# Patient Record
Sex: Female | Born: 1965
Health system: Southern US, Community
[De-identification: ages and names within clinical notes are randomized; demographics above are authoritative.]

## PROBLEM LIST (undated history)

## (undated) DIAGNOSIS — Z9989 Dependence on other enabling machines and devices: Secondary | ICD-10-CM

## (undated) DIAGNOSIS — K8020 Calculus of gallbladder without cholecystitis without obstruction: Secondary | ICD-10-CM

## (undated) DIAGNOSIS — K648 Other hemorrhoids: Secondary | ICD-10-CM

## (undated) DIAGNOSIS — T7840XA Allergy, unspecified, initial encounter: Secondary | ICD-10-CM

## (undated) DIAGNOSIS — G4733 Obstructive sleep apnea (adult) (pediatric): Secondary | ICD-10-CM

## (undated) DIAGNOSIS — N390 Urinary tract infection, site not specified: Secondary | ICD-10-CM

## (undated) DIAGNOSIS — F3181 Bipolar II disorder: Secondary | ICD-10-CM

## (undated) DIAGNOSIS — I10 Essential (primary) hypertension: Secondary | ICD-10-CM

## (undated) DIAGNOSIS — F32A Depression, unspecified: Secondary | ICD-10-CM

## (undated) DIAGNOSIS — M5030 Other cervical disc degeneration, unspecified cervical region: Secondary | ICD-10-CM

## (undated) DIAGNOSIS — R569 Unspecified convulsions: Secondary | ICD-10-CM

## (undated) DIAGNOSIS — R32 Unspecified urinary incontinence: Secondary | ICD-10-CM

## (undated) DIAGNOSIS — M7989 Other specified soft tissue disorders: Secondary | ICD-10-CM

## (undated) DIAGNOSIS — M1990 Unspecified osteoarthritis, unspecified site: Secondary | ICD-10-CM

## (undated) DIAGNOSIS — F329 Major depressive disorder, single episode, unspecified: Secondary | ICD-10-CM

## (undated) DIAGNOSIS — R192 Visible peristalsis: Secondary | ICD-10-CM

## (undated) DIAGNOSIS — R159 Full incontinence of feces: Secondary | ICD-10-CM

## (undated) DIAGNOSIS — R197 Diarrhea, unspecified: Secondary | ICD-10-CM

## (undated) DIAGNOSIS — K219 Gastro-esophageal reflux disease without esophagitis: Secondary | ICD-10-CM

## (undated) DIAGNOSIS — F419 Anxiety disorder, unspecified: Secondary | ICD-10-CM

## (undated) HISTORY — DX: Morbid (severe) obesity due to excess calories: E66.01

## (undated) HISTORY — DX: Allergy, unspecified, initial encounter: T78.40XA

## (undated) HISTORY — DX: Essential (primary) hypertension: I10

## (undated) HISTORY — DX: Unspecified convulsions: R56.9

## (undated) HISTORY — DX: Anxiety disorder, unspecified: F41.9

## (undated) HISTORY — DX: Bipolar II disorder: F31.81

## (undated) HISTORY — DX: Gastro-esophageal reflux disease without esophagitis: K21.9

---

## 1999-10-07 ENCOUNTER — Ambulatory Visit (HOSPITAL_COMMUNITY): Admission: RE | Admit: 1999-10-07 | Discharge: 1999-10-07 | Payer: Self-pay | Admitting: Obstetrics and Gynecology

## 1999-10-07 ENCOUNTER — Encounter: Payer: Self-pay | Admitting: Obstetrics and Gynecology

## 1999-12-10 ENCOUNTER — Inpatient Hospital Stay (HOSPITAL_COMMUNITY): Admission: AD | Admit: 1999-12-10 | Discharge: 1999-12-10 | Payer: Self-pay | Admitting: Obstetrics and Gynecology

## 2000-02-16 ENCOUNTER — Inpatient Hospital Stay (HOSPITAL_COMMUNITY): Admission: AD | Admit: 2000-02-16 | Discharge: 2000-02-16 | Payer: Self-pay | Admitting: Obstetrics and Gynecology

## 2000-02-18 ENCOUNTER — Inpatient Hospital Stay (HOSPITAL_COMMUNITY): Admission: AD | Admit: 2000-02-18 | Discharge: 2000-02-18 | Payer: Self-pay | Admitting: Obstetrics and Gynecology

## 2000-02-22 ENCOUNTER — Encounter (HOSPITAL_COMMUNITY): Admission: AD | Admit: 2000-02-22 | Discharge: 2000-02-24 | Payer: Self-pay | Admitting: Obstetrics and Gynecology

## 2000-02-23 ENCOUNTER — Inpatient Hospital Stay (HOSPITAL_COMMUNITY): Admission: AD | Admit: 2000-02-23 | Discharge: 2000-02-24 | Payer: Self-pay | Admitting: Obstetrics and Gynecology

## 2000-05-19 ENCOUNTER — Other Ambulatory Visit: Admission: RE | Admit: 2000-05-19 | Discharge: 2000-05-19 | Payer: Self-pay | Admitting: Obstetrics and Gynecology

## 2001-01-18 ENCOUNTER — Encounter: Admission: RE | Admit: 2001-01-18 | Discharge: 2001-01-18 | Payer: Self-pay | Admitting: Family Medicine

## 2001-01-18 ENCOUNTER — Encounter: Payer: Self-pay | Admitting: Family Medicine

## 2001-06-01 ENCOUNTER — Ambulatory Visit (HOSPITAL_BASED_OUTPATIENT_CLINIC_OR_DEPARTMENT_OTHER): Admission: RE | Admit: 2001-06-01 | Discharge: 2001-06-01 | Payer: Self-pay | Admitting: Family Medicine

## 2004-03-02 ENCOUNTER — Other Ambulatory Visit: Admission: RE | Admit: 2004-03-02 | Discharge: 2004-03-02 | Payer: Self-pay | Admitting: Family Medicine

## 2004-03-02 ENCOUNTER — Encounter: Admission: RE | Admit: 2004-03-02 | Discharge: 2004-03-02 | Payer: Self-pay | Admitting: Sports Medicine

## 2004-05-05 ENCOUNTER — Encounter: Admission: RE | Admit: 2004-05-05 | Discharge: 2004-05-05 | Payer: Self-pay | Admitting: Sports Medicine

## 2004-06-14 ENCOUNTER — Encounter: Admission: RE | Admit: 2004-06-14 | Discharge: 2004-06-14 | Payer: Self-pay | Admitting: Family Medicine

## 2004-08-02 ENCOUNTER — Ambulatory Visit: Payer: Self-pay | Admitting: Family Medicine

## 2004-08-13 ENCOUNTER — Ambulatory Visit: Payer: Self-pay | Admitting: Family Medicine

## 2004-09-14 ENCOUNTER — Ambulatory Visit: Payer: Self-pay | Admitting: Family Medicine

## 2004-09-27 ENCOUNTER — Ambulatory Visit: Payer: Self-pay | Admitting: Family Medicine

## 2004-11-12 ENCOUNTER — Ambulatory Visit: Payer: Self-pay | Admitting: Family Medicine

## 2005-02-21 ENCOUNTER — Encounter: Admission: RE | Admit: 2005-02-21 | Discharge: 2005-02-21 | Payer: Self-pay | Admitting: Sports Medicine

## 2005-02-21 ENCOUNTER — Ambulatory Visit: Payer: Self-pay | Admitting: Family Medicine

## 2005-02-28 ENCOUNTER — Encounter (INDEPENDENT_AMBULATORY_CARE_PROVIDER_SITE_OTHER): Payer: Self-pay | Admitting: *Deleted

## 2005-02-28 LAB — CONVERTED CEMR LAB

## 2005-04-04 ENCOUNTER — Ambulatory Visit: Payer: Self-pay | Admitting: Family Medicine

## 2005-04-18 ENCOUNTER — Ambulatory Visit: Payer: Self-pay | Admitting: Family Medicine

## 2005-05-17 ENCOUNTER — Ambulatory Visit: Payer: Self-pay | Admitting: Family Medicine

## 2005-06-20 ENCOUNTER — Ambulatory Visit: Payer: Self-pay | Admitting: Sports Medicine

## 2005-08-30 ENCOUNTER — Ambulatory Visit: Payer: Self-pay | Admitting: Family Medicine

## 2005-09-06 ENCOUNTER — Ambulatory Visit: Payer: Self-pay | Admitting: Family Medicine

## 2005-10-13 ENCOUNTER — Emergency Department (HOSPITAL_COMMUNITY): Admission: EM | Admit: 2005-10-13 | Discharge: 2005-10-13 | Payer: Self-pay | Admitting: Emergency Medicine

## 2005-10-17 ENCOUNTER — Emergency Department (HOSPITAL_COMMUNITY): Admission: EM | Admit: 2005-10-17 | Discharge: 2005-10-17 | Payer: Self-pay | Admitting: Emergency Medicine

## 2006-04-03 ENCOUNTER — Encounter: Admission: RE | Admit: 2006-04-03 | Discharge: 2006-04-03 | Payer: Self-pay | Admitting: General Practice

## 2006-12-28 DIAGNOSIS — F429 Obsessive-compulsive disorder, unspecified: Secondary | ICD-10-CM | POA: Insufficient documentation

## 2006-12-28 DIAGNOSIS — F339 Major depressive disorder, recurrent, unspecified: Secondary | ICD-10-CM | POA: Insufficient documentation

## 2006-12-28 DIAGNOSIS — G4700 Insomnia, unspecified: Secondary | ICD-10-CM

## 2006-12-28 DIAGNOSIS — L209 Atopic dermatitis, unspecified: Secondary | ICD-10-CM | POA: Insufficient documentation

## 2006-12-28 DIAGNOSIS — E669 Obesity, unspecified: Secondary | ICD-10-CM

## 2006-12-28 DIAGNOSIS — F909 Attention-deficit hyperactivity disorder, unspecified type: Secondary | ICD-10-CM | POA: Insufficient documentation

## 2006-12-28 DIAGNOSIS — F411 Generalized anxiety disorder: Secondary | ICD-10-CM | POA: Insufficient documentation

## 2006-12-28 DIAGNOSIS — H9190 Unspecified hearing loss, unspecified ear: Secondary | ICD-10-CM | POA: Insufficient documentation

## 2006-12-28 DIAGNOSIS — F410 Panic disorder [episodic paroxysmal anxiety] without agoraphobia: Secondary | ICD-10-CM | POA: Insufficient documentation

## 2006-12-28 DIAGNOSIS — D509 Iron deficiency anemia, unspecified: Secondary | ICD-10-CM

## 2006-12-28 DIAGNOSIS — F3181 Bipolar II disorder: Secondary | ICD-10-CM

## 2006-12-29 ENCOUNTER — Encounter (INDEPENDENT_AMBULATORY_CARE_PROVIDER_SITE_OTHER): Payer: Self-pay | Admitting: *Deleted

## 2011-03-09 ENCOUNTER — Other Ambulatory Visit (HOSPITAL_COMMUNITY): Payer: Self-pay | Admitting: Obstetrics

## 2011-03-09 DIAGNOSIS — Z1231 Encounter for screening mammogram for malignant neoplasm of breast: Secondary | ICD-10-CM

## 2011-03-16 ENCOUNTER — Ambulatory Visit (HOSPITAL_COMMUNITY): Payer: Medicaid Other | Attending: Obstetrics

## 2011-03-18 NOTE — Discharge Summary (Signed)
[facility]  Patient:    Shannon Sharp, Shannon Sharp                  MRN: [id number] Adm. Date:  [id number] Disc. Date: [id number] Attending:  Oliver Pila                           Discharge Summary  ADMISSION DIAGNOSIS:          Intrauterine pregnancy at 39 weeks, delivered.  DISCHARGE DIAGNOSIS:          Intrauterine pregnancy at 39 weeks, delivered.  PROCEDURES:                   Precipitous vaginal delivery.  COMPLICATIONS:                Precipitous vaginal delivery.  CONSULTATIONS:                None.  HISTORY AND PHYSICAL:         This is a 45 year old black female, gravida 3, para 1-0-1-1, with an EGA of [redacted] weeks with an EDC of Mar 01, 2000 via 13 week ultrasound, inconsistent with an LMP, who presented to maternity admissions with complaint of contractions every five minutes without leaking of fluid. Her source was _________ and with intact membranes in maternity admissions. She was admitted from triage at [facility] requesting an epidural which was ordered. Prenatal care was essentially uncomplicated.  PRENATAL LABORATORY:          Blood type was A positive with a negative antibody screen.  Sickle screen was negative.  RPR nonreactive, Rubella immune, Hepatitis B surface antigen negative, HIV negative, gonorrhea and chlamydia negative.  Pap smear was normal in April of 2000.  Triple screen was normal.  GCP was normal at 106 and Group B Strep is negative.  PAST OBSTETRICAL HISTORY:     In 1988 elective termination; 1989, vaginal delivery, 6 pounds 7 ounces without complications.  PAST GYN HISTORY:             Significant for cryotherapy in 1990.  MEDICAL HISTORY:              Significant for migraines for which she has taken Depakote 250 mg t.i.d. and folic acid.  ALLERGIES:                    PENICILLIN, CODEINE, and SULFA.  PHYSICAL EXAMINATION:  VITAL SIGNS:                  Blood pressures were slightly elevated during labor and she  was afebrile.  NST was reactive prior to delivery.  HEART:                        Regular rate and rhythm.  LUNGS:                        Clear to auscultation.  ABDOMEN:                      Gravid, nontender.  HOSPITAL COURSE:              Patient was admitted as above and sent to labor and delivery for an epidural.  Dr. Senaida Ores placed that at approximately 0740 and told that the patient was complete, complete with a bulging bag and had received  no epidural.  She then precipitously delivered before the physician could arrive.  She delivered a viable female with Apgars of 9 and 9 that weighed 6 pounds 9 ounces.  Dr. Senaida Ores examined her and there were no significant lacerations noted and blood loss was estimated at less than 500 cc.  Post delivery she was continued on her Depakote for her migraines and her blood pressures resolved and became normal.  On the evening of postpartum day #1, patient requested discharge.  At this time she was felt to be stable enough for discharge home.  CONDITION ON DISCHARGE:       Stable.  DISPOSITION:                  Discharged to home.  DISCHARGE INSTRUCTIONS:  DIET:                         Regular diet.  ACTIVITY:                     Pelvic rest.  FOLLOW-UP:                    Follow up in 4-6 weeks.  MEDICATIONS:                  1. Percocet p.r.n. pain.                               2. She is to continue her previous Depakote.  She is given our discharge summary pamphlet. DD:  02/24/00 TD:  02/26/00 Job: [id number]

## 2011-03-24 ENCOUNTER — Ambulatory Visit (HOSPITAL_COMMUNITY)
Admission: EM | Admit: 2011-03-24 | Discharge: 2011-03-24 | Disposition: A | Discharge status: Home / Self Care | Payer: Medicare Other | Source: Ambulatory Visit | Attending: Obstetrics | Admitting: Obstetrics

## 2011-03-24 ENCOUNTER — Other Ambulatory Visit: Payer: Self-pay | Admitting: Obstetrics

## 2011-03-24 DIAGNOSIS — N938 Other specified abnormal uterine and vaginal bleeding: Secondary | ICD-10-CM | POA: Insufficient documentation

## 2011-03-24 DIAGNOSIS — N949 Unspecified condition associated with female genital organs and menstrual cycle: Secondary | ICD-10-CM | POA: Diagnosis present

## 2011-03-24 DIAGNOSIS — N925 Other specified irregular menstruation: Secondary | ICD-10-CM | POA: Diagnosis present

## 2011-03-24 LAB — CBC
HCT: 33.2 % — ABNORMAL LOW (ref 36.0–46.0)
Hemoglobin: 10.3 g/dL — ABNORMAL LOW (ref 12.0–15.0)
MCV: 73.6 fL — ABNORMAL LOW (ref 78.0–100.0)
WBC: 7.1 10*3/uL (ref 4.0–10.5)

## 2011-03-30 NOTE — Op Note (Signed)
  Shannon Sharp, Shannon Sharp           ACCOUNT NO.:  [id number]  MEDICAL RECORD NO.:  [id number]           PATIENT TYPE:  O  LOCATION:  [facility]                          FACILITY:  [facility]  PHYSICIAN:  Kathreen Cosier, M.D.DATE OF BIRTH:  03-11-1966  DATE OF PROCEDURE:  03/24/2011 DATE OF DISCHARGE:                              OPERATIVE REPORT   PREOPERATIVE DIAGNOSIS:  Dysfunctional uterine bleeding.  POSTOPERATIVE DIAGNOSIS:  Dysfunctional uterine bleeding.  PROCEDURE:  Hysteroscopy and dilation and curettage.  SURGEON:  Kathreen Cosier, MD  Under general anesthesia, the patient in lithotomy position, perineum and vagina were prepped and draped.  Bladder was emptied with straight catheter.  Cervix was injected with 10 mL of 1% Xylocaine.  Endometrial cavity sounded 9 cm.  Cervix measured 5 cm.  Cervical length was 4 cm. Cavity length 5 cm.  Cervix dilated with #25 Shawnie Pons.  Hysteroscope was inserted.  Cavity appeared normal.  Hysteroscope removed and the endometrial cavity curetted.  Moderate amount of tissue obtained.  The NovaSure device was inserted and the cavity integrity test could not be passed.  Hysteroscopy revealed normal cavity.  No problems.  NovaSure inserted and cavity integrity not passed.  Procedure terminated at this point.          ______________________________ Kathreen Cosier, M.D.     BAM/MEDQ  D:  03/24/2011  T:  03/24/2011  Job:  [id number]  Electronically Signed by Francoise Ceo M.D. on 03/30/2011 08:05:35 AM

## 2011-07-07 ENCOUNTER — Ambulatory Visit: Payer: Medicare Other | Admitting: Cardiovascular Disease

## 2011-08-31 ENCOUNTER — Ambulatory Visit: Payer: Medicare Other | Admitting: Cardiovascular Disease

## 2011-09-13 ENCOUNTER — Encounter: Payer: Self-pay | Admitting: *Deleted

## 2011-09-14 ENCOUNTER — Ambulatory Visit: Payer: Medicare Other | Admitting: Cardiovascular Disease

## 2012-02-22 ENCOUNTER — Encounter (HOSPITAL_COMMUNITY): Payer: Self-pay | Admitting: *Deleted

## 2012-02-22 ENCOUNTER — Emergency Department (INDEPENDENT_AMBULATORY_CARE_PROVIDER_SITE_OTHER)
Admission: EM | Admit: 2012-02-22 | Discharge: 2012-02-22 | Disposition: A | Discharge status: Home / Self Care | Payer: Medicare Other | Source: Home / Self Care | Attending: Emergency Medicine | Admitting: Emergency Medicine

## 2012-02-22 DIAGNOSIS — R5381 Other malaise: Secondary | ICD-10-CM

## 2012-02-22 DIAGNOSIS — R5383 Other fatigue: Secondary | ICD-10-CM

## 2012-02-22 DIAGNOSIS — R05 Cough: Secondary | ICD-10-CM

## 2012-02-22 HISTORY — DX: Major depressive disorder, single episode, unspecified: F32.9

## 2012-02-22 HISTORY — DX: Depression, unspecified: F32.A

## 2012-02-22 MED ORDER — BENZONATATE 100 MG PO CAPS: 100.0000 mg | ORAL_CAPSULE | Freq: Three times a day (TID) | ORAL | Status: AC

## 2012-02-22 NOTE — ED Notes (Signed)
pT  HAS  SYMPTOMS  OF  COUGH  /  CONGESTED   BODY  ACHES      WITH  RUNNY NOSE  AND  FATIGUE    X  2  DAYS

## 2012-02-22 NOTE — ED Provider Notes (Signed)
History     CSN: [id number]  Arrival date & time 02/22/12  1304   First MD Initiated Contact with Patient 02/22/12 1356      Chief Complaint  Patient presents with  . Cough    (Consider location/radiation/quality/duration/timing/severity/associated sxs/prior treatment) HPI Comments: Coughing for 2 days, its dry bring no phlegm up, its tiresome, and its making me hurt all over" No SOB, No fevers, no upper congestion.    And unrelated to this cough, i have been feeling very tired for the last several months     Patient is a 46 y.o. female presenting with cough. The history is provided by the patient.  Cough This is a new problem. The current episode started 2 days ago. The problem occurs every few minutes. The cough is non-productive. There has been no fever. Pertinent negatives include no chills, no ear congestion, no rhinorrhea, no myalgias and no shortness of breath. She has tried decongestants for the symptoms. She is not a smoker. Her past medical history does not include COPD or asthma.    Past Medical History  Diagnosis Date  . Morbid obesity   . Depression     History reviewed. No pertinent past surgical history.  History reviewed. No pertinent family history.  History  Substance Use Topics  . Smoking status: Not on file  . Smokeless tobacco: Not on file  . Alcohol Use:     OB History    Grav Para Term Preterm Abortions TAB SAB Ect Mult Living                  Review of Systems  Constitutional: Negative for fever, chills and activity change.  HENT: Negative for rhinorrhea.   Respiratory: Positive for cough. Negative for chest tightness and shortness of breath.   Musculoskeletal: Negative for myalgias.    Allergies  Codeine; Penicillins; and Sulfa antibiotics  Home Medications   Current Outpatient Rx  Name Route Sig Dispense Refill  . DIVALPROEX SODIUM ER 500 MG PO TB24 Oral Take 1,000 mg by mouth 2 (two) times daily.    Marland Kitchen FLUOXETINE HCL 40 MG  PO CAPS Oral Take 40 mg by mouth daily.    Marland Kitchen BENZONATATE 100 MG PO CAPS Oral Take 1 capsule (100 mg total) by mouth every 8 (eight) hours. 21 capsule 0    BP 133/81  Pulse 85  Temp(Src) 99.2 F (37.3 C) (Oral)  Resp 16  SpO2 97%  Physical Exam  Nursing note and vitals reviewed. Constitutional: No distress.  HENT:  Head: Normocephalic.  Mouth/Throat: No oropharyngeal exudate.  Eyes: Conjunctivae are normal.  Cardiovascular: Normal rate.  Exam reveals no gallop.   No murmur heard. Pulmonary/Chest: Effort normal and breath sounds normal. No respiratory distress. She has no decreased breath sounds. She has no wheezes. She has no rhonchi. She has no rales.  Lymphadenopathy:    She has no cervical adenopathy.  Skin: No rash noted. She is not diaphoretic.    ED Course  Procedures (including critical care time)  Labs Reviewed - No data to display No results found.   1. Cough   2. Fatigue       MDM  Patient with two concerns 1- Cough x 2 days (dry) afebrile, non-dyspneic 2- Ongoing fatigued and tiredness. 1- Symptomatic management,  Unlikely infectious. Return if cough to persist beyond one week.  2-Patient was advised to established with a primray care doctor agreed to evaluate this last symptom, as she needs other screening test.  Patent agreed to follow-up with a new PCP, patient agreed      Jimmie Molly, MD 02/22/12 1819

## 2012-02-22 NOTE — Discharge Instructions (Signed)
  Start with this capsules for your cough and as we discuss should establish a primary care Dr. to look into your ongoing fatigue syndrome. See included numbers   Cough, Adult  A cough is a reflex. It helps you clear your throat and airways. A cough can help heal your body. A cough can last 2 or 3 weeks (acute) or may last more than 8 weeks (chronic). Some common causes of a cough can include an infection, allergy, or a cold. HOME CARE  Only take medicine as told by your doctor.   If given, take your medicines (antibiotics) as told. Finish them even if you start to feel better.   Use a cold steam vaporizer or humidier in your home. This can help loosen thick spit (secretions).   Sleep so you are almost sitting up (semi-upright). Use pillows to do this. This helps reduce coughing.   Rest as needed.   Stop smoking if you smoke.  GET HELP RIGHT AWAY IF:  You have yellowish-white fluid (pus) in your thick spit.   Your cough gets worse.   Your medicine does not reduce coughing, and you are losing sleep.   You cough up blood.   You have trouble breathing.   Your pain gets worse and medicine does not help.   You have a fever.  MAKE SURE YOU:   Understand these instructions.   Will watch your condition.   Will get help right away if you are not doing well or get worse.  Document Released: 06/30/2011 Document Revised: 10/06/2011 Document Reviewed: 06/30/2011 [facility] Patient Information 2012 [place], [place].

## 2013-04-21 ENCOUNTER — Emergency Department (HOSPITAL_COMMUNITY): Payer: Medicare Other

## 2013-04-21 ENCOUNTER — Emergency Department (HOSPITAL_COMMUNITY)
Admission: EM | Admit: 2013-04-21 | Discharge: 2013-04-21 | Disposition: A | Discharge status: Home / Self Care | Payer: Medicare Other | Attending: Emergency Medicine | Admitting: Emergency Medicine

## 2013-04-21 ENCOUNTER — Encounter (HOSPITAL_COMMUNITY): Payer: Self-pay | Admitting: Nurse Practitioner

## 2013-04-21 DIAGNOSIS — R0602 Shortness of breath: Secondary | ICD-10-CM | POA: Insufficient documentation

## 2013-04-21 DIAGNOSIS — F3289 Other specified depressive episodes: Secondary | ICD-10-CM | POA: Insufficient documentation

## 2013-04-21 DIAGNOSIS — Z79899 Other long term (current) drug therapy: Secondary | ICD-10-CM | POA: Insufficient documentation

## 2013-04-21 DIAGNOSIS — M7989 Other specified soft tissue disorders: Secondary | ICD-10-CM | POA: Insufficient documentation

## 2013-04-21 DIAGNOSIS — R05 Cough: Secondary | ICD-10-CM | POA: Insufficient documentation

## 2013-04-21 DIAGNOSIS — Z882 Allergy status to sulfonamides status: Secondary | ICD-10-CM | POA: Insufficient documentation

## 2013-04-21 DIAGNOSIS — R059 Cough, unspecified: Secondary | ICD-10-CM | POA: Insufficient documentation

## 2013-04-21 DIAGNOSIS — Z880 Allergy status to penicillin: Secondary | ICD-10-CM | POA: Insufficient documentation

## 2013-04-21 DIAGNOSIS — R058 Other specified cough: Secondary | ICD-10-CM

## 2013-04-21 DIAGNOSIS — Z885 Allergy status to narcotic agent status: Secondary | ICD-10-CM | POA: Insufficient documentation

## 2013-04-21 DIAGNOSIS — F329 Major depressive disorder, single episode, unspecified: Secondary | ICD-10-CM | POA: Insufficient documentation

## 2013-04-21 LAB — BASIC METABOLIC PANEL
BUN: 11 mg/dL (ref 6–23)
CO2: 24 mEq/L (ref 19–32)
Calcium: 9 mg/dL (ref 8.4–10.5)
Creatinine, Ser: 0.85 mg/dL (ref 0.50–1.10)
Glucose, Bld: 88 mg/dL (ref 70–99)

## 2013-04-21 LAB — CBC
HCT: 31.4 % — ABNORMAL LOW (ref 36.0–46.0)
Hemoglobin: 9.9 g/dL — ABNORMAL LOW (ref 12.0–15.0)
MCH: 22.7 pg — ABNORMAL LOW (ref 26.0–34.0)
MCHC: 31.5 g/dL (ref 30.0–36.0)
MCV: 71.9 fL — ABNORMAL LOW (ref 78.0–100.0)
RBC: 4.37 MIL/uL (ref 3.87–5.11)

## 2013-04-21 LAB — POCT I-STAT TROPONIN I: Troponin i, poc: 0 ng/mL (ref 0.00–0.08)

## 2013-04-21 NOTE — ED Notes (Signed)
C/o dry cough and mild SOB over past month. Legs swelling increasingly worse over past year. Has been unable to get appt with PCP so decided to come to ED today. States her legs hurt and itch intermittently

## 2013-04-21 NOTE — ED Provider Notes (Signed)
History     CSN: [id number]  Arrival date & time 04/21/13  1322   First MD Initiated Contact with Patient 04/21/13 1345      Chief Complaint  Patient presents with  . Cough    (Consider location/radiation/quality/duration/timing/severity/associated sxs/prior treatment) HPI Pt is a 47yo morbidly obese female presenting today with dry cough that has persisted over the last month, associated with mild SOB on exertion.  Pt also c/o bilateral leg swelling that has been worsening over the past year, but even more so over the past 3 weeks to the point it is difficult to put on her shoes. Pt denies leg pain but states they do feel "tight."    Pt states she was seen at a walk-in clinic about a month ago and was placed on lisinopril.  Pt stopped medication 2 weeks ago due to increased urination, states she already has a "leaky bladder" and has been told the medication could be contributing to her dry cough.  Pt has not been taking other BP medications since stopping lisinopril.  Denies fever, n/v/d, diaphoresis, chest pain or abdominal pain.   Past Medical History  Diagnosis Date  . Morbid obesity   . Depression     History reviewed. No pertinent past surgical history.  History reviewed. No pertinent family history.  History  Substance Use Topics  . Smoking status: Never Smoker   . Smokeless tobacco: Not on file  . Alcohol Use: No    OB History   Grav Para Term Preterm Abortions TAB SAB Ect Mult Living                  Review of Systems  Constitutional: Negative for fever and chills.  Respiratory: Positive for cough and shortness of breath ( mild).   Cardiovascular: Positive for leg swelling. Negative for chest pain and palpitations.  Gastrointestinal: Negative for nausea, vomiting and diarrhea.  Skin: Negative for rash and wound.  Neurological: Negative for dizziness and light-headedness.  All other systems reviewed and are negative.    Allergies  Codeine; Penicillins; and  Sulfa antibiotics  Home Medications   Current Outpatient Rx  Name  Route  Sig  Dispense  Refill  . divalproex (DEPAKOTE ER) 500 MG 24 hr tablet   Oral   Take 1,000 mg by mouth 2 (two) times daily.         Marland Kitchen guaiFENesin (MUCINEX) 600 MG 12 hr tablet   Oral   Take 1,200 mg by mouth 2 (two) times daily as needed for congestion.         Marland Kitchen lisinopril-hydrochlorothiazide (PRINZIDE,ZESTORETIC) 20-25 MG per tablet   Oral   Take 1 tablet by mouth daily.         Marland Kitchen oxybutynin (DITROPAN XL) 10 MG 24 hr tablet   Oral   Take 10 mg by mouth daily.         . traZODone (DESYREL) 150 MG tablet   Oral   Take 150 mg by mouth at bedtime.         Marland Kitchen zolpidem (AMBIEN) 10 MG tablet   Oral   Take 10 mg by mouth at bedtime as needed for sleep.           BP 146/71  Pulse 94  Temp(Src) 98.7 F (37.1 C) (Oral)  Resp 15  Ht 5\' 6"  (1.676 m)  Wt 300 lb (136.079 kg)  BMI 48.44 kg/m2  SpO2 96%  Physical Exam  Nursing note and vitals reviewed. Constitutional: No  distress.  Morbidly obese female lying comfortably in exam bed.    HENT:  Head: Normocephalic and atraumatic.  Eyes: Conjunctivae are normal. No scleral icterus.  Neck: Normal range of motion.  Cardiovascular: Normal rate, regular rhythm and normal heart sounds.   Pulmonary/Chest: Effort normal and breath sounds normal. No respiratory distress. She has no wheezes. She has no rales. She exhibits no tenderness.  Abdominal: Soft. Bowel sounds are normal. She exhibits no distension. There is no tenderness.  Musculoskeletal: She exhibits edema ( bilateral legs severe edema, 2+ pitting edema). She exhibits no tenderness.  Neurological: She is alert.  Skin: Skin is warm and dry. She is not diaphoretic.    ED Course  Procedures (including critical care time)  Labs Reviewed  CBC - Abnormal; Notable for the following:    Hemoglobin 9.9 (*)    HCT 31.4 (*)    MCV 71.9 (*)    MCH 22.7 (*)    RDW 15.6 (*)    All other  components within normal limits  BASIC METABOLIC PANEL - Abnormal; Notable for the following:    GFR calc non Af Amer 80 (*)    All other components within normal limits  PRO B NATRIURETIC PEPTIDE  POCT I-STAT TROPONIN I   Dg Chest 2 View  04/21/2013   *RADIOLOGY REPORT*  Clinical Data: Cough and shortness of breath.  CHEST - 2 VIEW  Comparison: None.  Findings: No pulmonary infiltrate, edema, nodule or pleural fluid is identified.  There is minimal scarring versus subsegmental atelectasis in the left lower lung.  Heart size and mediastinal contours are within normal limits.  The bony thorax shows mild degenerative changes of the thoracic spine.  IMPRESSION: No active disease.   Original Report Authenticated By: Irish Lack, M.D.     Date: 04/21/2013  Rate: 89  Rhythm: normal sinus rhythm  QRS Axis: normal  Intervals: normal  ST/T Wave abnormalities: nonspecific T wave changes  Conduction Disutrbances:none  Narrative Interpretation:   Old EKG Reviewed: none available    1. Leg swelling   2. Cough due to ACE inhibitor       MDM  Chronic leg swelling.  Pt's cough likely due to lisinipril and increased swelling due to lack of BP medication at this time.  Will perform cardiac workup to ensure this is not new onset CHF.    Troponin: neg CBC: anemia, consistent with pt's previous labs  BMP: unremarkable CXR: nl EKG: unremarkable, no STEMI  Cardiac workup, unremarkable.  Do not believe symptoms are due to CHF or ACS.  Do not believe DVT is very likely at this time due to chronic nature of symptoms, equal bilateral leg swelling, no pain or redness.  Do not believe doppler is needed at this time.   Pt is to f/u with PCP who can better manage chronic symptoms and treatments. Gave pt Recruitment consultant and explained how remaining off lisinopril will help her cough over time however she still needs to be placed on some type of BP medication to help reduce swelling in legs.  This  needs to be done by PCP who can better follow and manage pt's ongoing care and treatment. F/u with PCP, [place] [facility] info provided. Return precautions given. Pt verbalized understanding and agreement with tx plan. Vitals: unremarkable. Discharged in stable condition.    Discussed pt with attending during ED encounter.          Junius Finner, PA-C 04/21/13 1600

## 2013-04-21 NOTE — ED Provider Notes (Signed)
Medical screening examination/treatment/procedure(s) were performed by non-physician practitioner and as supervising physician I was immediately available for consultation/collaboration.   Charles B. Bernette Mayers, MD 04/21/13 1623

## 2014-11-30 ENCOUNTER — Emergency Department (HOSPITAL_COMMUNITY)
Admission: EM | Admit: 2014-11-30 | Discharge: 2014-12-01 | Disposition: A | Discharge status: Home / Self Care | Payer: Medicare Other | Attending: Emergency Medicine | Admitting: Emergency Medicine

## 2014-11-30 DIAGNOSIS — F329 Major depressive disorder, single episode, unspecified: Secondary | ICD-10-CM | POA: Insufficient documentation

## 2014-11-30 DIAGNOSIS — Z880 Allergy status to penicillin: Secondary | ICD-10-CM | POA: Diagnosis not present

## 2014-11-30 DIAGNOSIS — R05 Cough: Secondary | ICD-10-CM | POA: Diagnosis present

## 2014-11-30 DIAGNOSIS — Z79899 Other long term (current) drug therapy: Secondary | ICD-10-CM | POA: Diagnosis not present

## 2014-11-30 DIAGNOSIS — R059 Cough, unspecified: Secondary | ICD-10-CM

## 2014-12-01 ENCOUNTER — Emergency Department (HOSPITAL_COMMUNITY): Payer: Medicare Other

## 2014-12-01 ENCOUNTER — Encounter (HOSPITAL_COMMUNITY): Payer: Self-pay | Admitting: Emergency Medicine

## 2014-12-01 DIAGNOSIS — R05 Cough: Secondary | ICD-10-CM | POA: Diagnosis not present

## 2014-12-01 MED ORDER — ALBUTEROL SULFATE HFA 108 (90 BASE) MCG/ACT IN AERS
INHALATION_SPRAY | RESPIRATORY_TRACT | Status: AC
  Filled 2014-12-01: qty 6.7

## 2014-12-01 MED ORDER — ALBUTEROL SULFATE HFA 108 (90 BASE) MCG/ACT IN AERS
2.0000 | INHALATION_SPRAY | RESPIRATORY_TRACT | Status: DC | PRN
  Administered 2014-12-01: 2 via RESPIRATORY_TRACT

## 2014-12-01 NOTE — ED Notes (Signed)
Patient transported to X-ray 

## 2014-12-01 NOTE — ED Notes (Signed)
Pt c/o persistent dry cough that constantly wake her up during night time, pt states is no secretion coming up or blood, no SOB or CP associated with the cough, but she still having this cough for almost 3 years getting worse now.

## 2014-12-01 NOTE — ED Provider Notes (Signed)
CSN: [id number]     Arrival date & time 11/30/14  2356 History   First MD Initiated Contact with Patient 12/01/14 0011     Chief Complaint  Patient presents with  . Cough     (Consider location/radiation/quality/duration/timing/severity/associated sxs/prior Treatment) Patient is a 49 y.o. female presenting with cough. The history is provided by the patient. No language interpreter was used.  Cough Cough characteristics:  Dry Associated symptoms: no chills and no fever   Associated symptoms comment:  Cough episode that woke her from sleep, that lasted about 20 minutes. No chest pain, vomiting, fever. She reports 3-year history of cough, with various evaluations and treatments, all without resolution of mild persistent cough. Tonight she became concerned when it woke her from sleep. She is currently asymptomatic.    Past Medical History  Diagnosis Date  . Morbid obesity   . Depression    History reviewed. No pertinent past surgical history. History reviewed. No pertinent family history. History  Substance Use Topics  . Smoking status: Never Smoker   . Smokeless tobacco: Never Used  . Alcohol Use: No   OB History    No data available     Review of Systems  Constitutional: Negative for fever and chills.  HENT: Negative.   Respiratory: Positive for cough.   Cardiovascular: Negative.   Gastrointestinal: Negative.   Musculoskeletal: Negative.   Skin: Negative.   Neurological: Negative.       Allergies  Codeine; Penicillins; and Sulfa antibiotics  Home Medications   Prior to Admission medications   Medication Sig Start Date End Date Taking? Authorizing Provider  divalproex (DEPAKOTE ER) 500 MG 24 hr tablet Take 1,000 mg by mouth 2 (two) times daily.    Historical Provider, MD  guaiFENesin (MUCINEX) 600 MG 12 hr tablet Take 1,200 mg by mouth 2 (two) times daily as needed for congestion.    Historical Provider, MD  lisinopril-hydrochlorothiazide (PRINZIDE,ZESTORETIC)  20-25 MG per tablet Take 1 tablet by mouth daily.    Historical Provider, MD  oxybutynin (DITROPAN XL) 10 MG 24 hr tablet Take 10 mg by mouth daily.    Historical Provider, MD  traZODone (DESYREL) 150 MG tablet Take 150 mg by mouth at bedtime.    Historical Provider, MD  zolpidem (AMBIEN) 10 MG tablet Take 10 mg by mouth at bedtime as needed for sleep.    Historical Provider, MD   BP 146/64 mmHg  Pulse 68  Temp(Src) 97.6 F (36.4 C) (Oral)  Resp 23  Ht 5\' 6"  (1.676 m)  Wt 280 lb (127.007 kg)  BMI 45.21 kg/m2  SpO2 98% Physical Exam  Constitutional: She is oriented to person, place, and time. She appears well-developed and well-nourished.  HENT:  Head: Normocephalic.  Neck: Normal range of motion. Neck supple.  Cardiovascular: Normal rate and regular rhythm.   Pulmonary/Chest: Effort normal and breath sounds normal. She has no wheezes. She has no rales.  Abdominal: Soft. Bowel sounds are normal. There is no tenderness. There is no rebound and no guarding.  Musculoskeletal: Normal range of motion. She exhibits no edema.  Neurological: She is alert and oriented to person, place, and time.  Skin: Skin is warm and dry. No rash noted.  Psychiatric: She has a normal mood and affect.    ED Course  Procedures (including critical care time) Labs Review Labs Reviewed - No data to display  Imaging Review No results found.   EKG Interpretation None      MDM   Final  diagnoses:  None    1. Cough  No coughing in ED. No aspiration on CXR - also no evidence PNA. VSS. On reassessment, there is no tachypnea. She appears well, sleeping comfortably, easily awakened and NAD. Stable for discharge.     Arnoldo Hooker, PA-C 12/01/14 0216  Dione Booze, MD 12/01/14 [id number]

## 2014-12-01 NOTE — Discharge Instructions (Signed)
Cough, Adult   A cough is a reflex. It helps you clear your throat and airways. A cough can help heal your body. A cough can last 2 or 3 weeks (acute) or may last more than 8 weeks (chronic). Some common causes of a cough can include an infection, allergy, or a cold.  HOME CARE  · Only take medicine as told by your doctor.  · If given, take your medicines (antibiotics) as told. Finish them even if you start to feel better.  · Use a cold steam vaporizer or humidifier in your home. This can help loosen thick spit (secretions).  · Sleep so you are almost sitting up (semi-upright). Use pillows to do this. This helps reduce coughing.  · Rest as needed.  · Stop smoking if you smoke.  GET HELP RIGHT AWAY IF:  · You have yellowish-white fluid (pus) in your thick spit.  · Your cough gets worse.  · Your medicine does not reduce coughing, and you are losing sleep.  · You cough up blood.  · You have trouble breathing.  · Your pain gets worse and medicine does not help.  · You have a fever.  MAKE SURE YOU:   · Understand these instructions.  · Will watch your condition.  · Will get help right away if you are not doing well or get worse.  Document Released: 06/30/2011 Document Revised: 03/03/2014 Document Reviewed: 06/30/2011  [employer]® Patient Information ©2015 [employer]. This information is not intended to replace advice given to you by your health care provider. Make sure you discuss any questions you have with your health care provider.

## 2015-01-01 ENCOUNTER — Other Ambulatory Visit: Payer: Self-pay | Admitting: Physician Assistant

## 2016-05-09 ENCOUNTER — Emergency Department (HOSPITAL_COMMUNITY): Payer: Medicare Other

## 2016-05-09 ENCOUNTER — Emergency Department (HOSPITAL_COMMUNITY)
Admission: EM | Admit: 2016-05-09 | Discharge: 2016-05-09 | Disposition: A | Discharge status: Home / Self Care | Payer: Medicare Other | Attending: Emergency Medicine | Admitting: Emergency Medicine

## 2016-05-09 ENCOUNTER — Encounter (HOSPITAL_COMMUNITY): Payer: Self-pay

## 2016-05-09 DIAGNOSIS — R05 Cough: Secondary | ICD-10-CM | POA: Diagnosis present

## 2016-05-09 DIAGNOSIS — J40 Bronchitis, not specified as acute or chronic: Secondary | ICD-10-CM | POA: Diagnosis not present

## 2016-05-09 DIAGNOSIS — Z79899 Other long term (current) drug therapy: Secondary | ICD-10-CM | POA: Diagnosis not present

## 2016-05-09 MED ORDER — ALBUTEROL SULFATE HFA 108 (90 BASE) MCG/ACT IN AERS: 2.0000 | INHALATION_SPRAY | RESPIRATORY_TRACT | Status: DC | PRN

## 2016-05-09 MED ORDER — AZITHROMYCIN 250 MG PO TABS: 250.0000 mg | ORAL_TABLET | Freq: Every day | ORAL | Status: DC

## 2016-05-09 MED ORDER — BENZONATATE 100 MG PO CAPS: 100.0000 mg | ORAL_CAPSULE | Freq: Three times a day (TID) | ORAL | Status: DC | PRN

## 2016-05-09 NOTE — Discharge Instructions (Signed)

## 2016-05-09 NOTE — ED Notes (Signed)
Declined W/C at D/C and was escorted to lobby by RN. 

## 2016-05-09 NOTE — ED Provider Notes (Signed)
CSN: [id number]     Arrival date & time 05/09/16  1024 History   By signing my name below, I, [name], attest that this documentation has been prepared under the direction and in the presence of  Cheri Fowler, PA-C Electronically Signed: Charline Bills, ED Scribe 05/09/2016 at 12:08 PM.  Chief Complaint  Patient presents with  . Cough  . Nasal Congestion   The history is provided by the patient. No language interpreter was used.   HPI Comments: Shannon Sharp is a 50 y.o. female who presents to the Emergency Department complaining of persistent cough for the past 3 weeks. Pt reports cold-like symptoms of sneezing, sore throat and productive cough 3 weeks ago that resolved. She states that cough became dry 1 week ago and is worse at night. Pt reports associated symptoms of nasal congestion, wheezing and back pain that she attributes to coughing. She has tried OTC cough syrup and her Spiriva inhaler last night with minimal relief. She denies fever, nausea, vomiting, diarrhea, chest pain, leg swelling, SOB. Pt also denies recent surgeries or long car rides. No h/o DVT/PE.  Past Medical History  Diagnosis Date  . Morbid obesity (HCC)   . Depression    History reviewed. No pertinent past surgical history. No family history on file. Social History  Substance Use Topics  . Smoking status: Never Smoker   . Smokeless tobacco: Never Used  . Alcohol Use: No   OB History    No data available     Review of Systems  Constitutional: Negative for fever.  HENT: Positive for congestion. Negative for sneezing and sore throat.   Respiratory: Positive for cough and wheezing. Negative for shortness of breath.   Cardiovascular: Negative for chest pain and leg swelling.  Gastrointestinal: Negative for nausea, vomiting and diarrhea.  Musculoskeletal: Positive for back pain.   Allergies  Codeine; Penicillins; and Sulfa antibiotics  Home Medications   Prior to Admission medications    Medication Sig Start Date End Date Taking? Authorizing Provider  albuterol (PROVENTIL HFA;VENTOLIN HFA) 108 (90 Base) MCG/ACT inhaler Inhale 2 puffs into the lungs every 4 (four) hours as needed for wheezing or shortness of breath. 05/09/16   Cheri Fowler, PA-C  ALPRAZolam (XANAX) 0.25 MG tablet Take 0.25 mg by mouth 2 (two) times daily as needed. 11/18/14   Historical Provider, MD  azithromycin (ZITHROMAX) 250 MG tablet Take 1 tablet (250 mg total) by mouth daily. Take first 2 tablets together, then 1 every day until finished. 05/09/16   Leiam Hopwood, PA-C  benzonatate (TESSALON) 100 MG capsule Take 1 capsule (100 mg total) by mouth 3 (three) times daily as needed for cough. 05/09/16   Cheri Fowler, PA-C  buPROPion (WELLBUTRIN XL) 150 MG 24 hr tablet Take 150 mg by mouth daily.  10/14/14   Historical Provider, MD  cloNIDine (CATAPRES) 0.1 MG tablet Take 0.1 mg by mouth at bedtime.  11/03/14   Historical Provider, MD  divalproex (DEPAKOTE ER) 500 MG 24 hr tablet Take 1,000 mg by mouth 2 (two) times daily.    Historical Provider, MD  FLUoxetine (PROZAC) 20 MG capsule Take 60 mg by mouth daily. 11/18/14   Historical Provider, MD  oxybutynin (DITROPAN XL) 10 MG 24 hr tablet Take 10 mg by mouth daily.    Historical Provider, MD  pantoprazole (PROTONIX) 20 MG tablet Take 20 mg by mouth daily. 10/17/14   Historical Provider, MD  topiramate (TOPAMAX) 50 MG tablet Take 50 mg by mouth at bedtime.  11/03/14   Historical Provider, MD  traZODone (DESYREL) 150 MG tablet Take 150 mg by mouth at bedtime.    Historical Provider, MD  zolpidem (AMBIEN) 10 MG tablet Take 10 mg by mouth at bedtime as needed for sleep.    Historical Provider, MD   BP 116/64 mmHg  Pulse 61  Temp(Src) 98.2 F (36.8 C) (Oral)  Resp 18  Ht 5\' 6"  (1.676 m)  Wt 295 lb (133.811 kg)  BMI 47.64 kg/m2  SpO2 95% Physical Exam  Constitutional: She is oriented to person, place, and time. She appears well-developed and well-nourished.  Non-toxic  appearance. She does not have a sickly appearance. She does not appear ill.  HENT:  Head: Normocephalic and atraumatic.  Right Ear: External ear normal.  Left Ear: External ear normal.  Mouth/Throat: Oropharynx is clear and moist.  Eyes: Conjunctivae are normal. No scleral icterus.  Neck: Normal range of motion. Neck supple. No tracheal deviation present.  Cardiovascular: Normal rate and regular rhythm.   No unilateral lower extremity edema.   Pulmonary/Chest: Effort normal and breath sounds normal. No accessory muscle usage or stridor. No respiratory distress. She has no wheezes. She has no rhonchi. She has no rales.  Abdominal: Soft. Bowel sounds are normal. She exhibits no distension. There is no tenderness.  Musculoskeletal: Normal range of motion.  Lymphadenopathy:    She has no cervical adenopathy.  Neurological: She is alert and oriented to person, place, and time.  Speech clear without dysarthria.  Skin: Skin is warm and dry.  Psychiatric: She has a normal mood and affect. Her behavior is normal.   ED Course  Procedures (including critical care time) DIAGNOSTIC STUDIES: Oxygen Saturation is 95% on RA, normal by my interpretation.    COORDINATION OF CARE: 11:32 AM-Discussed treatment plan which includes CXR with pt at bedside and pt agreed to plan.   Labs Review Labs Reviewed - No data to display  Imaging Review Dg Chest 2 View  05/09/2016  CLINICAL DATA:  Cough and congestion for 3 weeks EXAM: CHEST  2 VIEW COMPARISON:  12/01/2014 FINDINGS: Cardiac shadow is mildly enlarged but stable. Persistent bilateral bibasilar scarring is seen. No new focal infiltrate or sizable effusion is noted. No bony abnormality is seen. IMPRESSION: Stable bibasilar scarring.  No acute abnormality noted. Electronically Signed   By: Alcide CleverMark  Lukens M.D.   On: 05/09/2016 11:59   I have personally reviewed and evaluated these images and lab results as part of my medical decision-making.   EKG  Interpretation None      MDM   Final diagnoses:  Bronchitis   Patient presents with findings consistent with bronchitis. Plain films negative for pneumonia. VSS, NAD. Heart RRR, Lungs CTAB. Abdomen soft and benign. No unilateral lower extremity edema. Low risk for PE using well's criteria. Plan to discharge home with Tessalon Perles, azithromycin, and albuterol inhaler given 3 week history of worsening cough. Follow up [facility]. Discussed return precautions. Patient agrees and acknowledges the above plan for discharge.  I personally performed the services described in this documentation, which was scribed in my presence. The recorded information has been reviewed and is accurate.    Cheri FowlerKayla Maycee Blasco, PA-C 05/09/16 1240  Tilden FossaElizabeth Rees, MD 05/10/16 [phone]

## 2016-05-09 NOTE — ED Notes (Signed)
Patient here with 3 weeks of cough and congestion, taking otc meds with minimal relief. Speaking full sentences, no distess

## 2017-02-28 DIAGNOSIS — K8020 Calculus of gallbladder without cholecystitis without obstruction: Secondary | ICD-10-CM

## 2017-02-28 HISTORY — DX: Calculus of gallbladder without cholecystitis without obstruction: K80.20

## 2017-03-02 ENCOUNTER — Encounter: Payer: Self-pay | Admitting: Family

## 2017-03-02 ENCOUNTER — Ambulatory Visit (INDEPENDENT_AMBULATORY_CARE_PROVIDER_SITE_OTHER): Payer: 59 | Admitting: Family

## 2017-03-02 ENCOUNTER — Other Ambulatory Visit (INDEPENDENT_AMBULATORY_CARE_PROVIDER_SITE_OTHER): Payer: 59

## 2017-03-02 VITALS — BP 132/86 | HR 94 | Temp 98.5°F | Ht 66.0 in | Wt 302.0 lb

## 2017-03-02 DIAGNOSIS — Z6841 Body Mass Index (BMI) 40.0 and over, adult: Secondary | ICD-10-CM | POA: Diagnosis not present

## 2017-03-02 DIAGNOSIS — Z5181 Encounter for therapeutic drug level monitoring: Secondary | ICD-10-CM

## 2017-03-02 DIAGNOSIS — M25562 Pain in left knee: Secondary | ICD-10-CM | POA: Diagnosis not present

## 2017-03-02 DIAGNOSIS — M25561 Pain in right knee: Secondary | ICD-10-CM

## 2017-03-02 DIAGNOSIS — R350 Frequency of micturition: Secondary | ICD-10-CM | POA: Insufficient documentation

## 2017-03-02 DIAGNOSIS — F3181 Bipolar II disorder: Secondary | ICD-10-CM

## 2017-03-02 LAB — CBC
HCT: 39.5 % (ref 36.0–46.0)
Hemoglobin: 12.4 g/dL (ref 12.0–15.0)
MCHC: 31.3 g/dL (ref 30.0–36.0)
MCV: 77 fl — AB (ref 78.0–100.0)
PLATELETS: 312 10*3/uL (ref 150.0–400.0)
RBC: 5.13 Mil/uL — ABNORMAL HIGH (ref 3.87–5.11)
RDW: 16.1 % — AB (ref 11.5–15.5)
WBC: 10.7 10*3/uL — ABNORMAL HIGH (ref 4.0–10.5)

## 2017-03-02 LAB — COMPREHENSIVE METABOLIC PANEL
ALT: 26 U/L (ref 0–35)
AST: 21 U/L (ref 0–37)
Albumin: 4.3 g/dL (ref 3.5–5.2)
Alkaline Phosphatase: 71 U/L (ref 39–117)
BILIRUBIN TOTAL: 0.2 mg/dL (ref 0.2–1.2)
BUN: 15 mg/dL (ref 6–23)
CHLORIDE: 106 meq/L (ref 96–112)
CO2: 26 meq/L (ref 19–32)
CREATININE: 1.27 mg/dL — AB (ref 0.40–1.20)
Calcium: 10.4 mg/dL (ref 8.4–10.5)
GFR: 56.94 mL/min — ABNORMAL LOW (ref >60.00)
GLUCOSE: 97 mg/dL (ref 70–99)
Potassium: 3.9 mEq/L (ref 3.5–5.1)
SODIUM: 141 meq/L (ref 135–145)
Total Protein: 8.5 g/dL — ABNORMAL HIGH (ref 6.0–8.3)

## 2017-03-02 MED ORDER — MELOXICAM 15 MG PO TABS: 15.0000 mg | ORAL_TABLET | Freq: Every day | ORAL | 0 refills | Status: DC

## 2017-03-02 NOTE — Progress Notes (Signed)
Subjective:    Patient ID: Shannon Sharp, female    DOB: 03-08-1966, 51 y.o.   MRN: [id number]  Chief Complaint  Patient presents with  . New Patient (Initial Visit)    Establishing care    HPI:  [name] is a 51 y.o. female who  has a past medical history of Bipolar 2 disorder ([place]); Depression; and Morbid obesity ([place]). and presents today for an office visit to establish care.   1.) Bipoloar - Currently maintained on Fluoxetine, Wellbutrin, Toparimate and clonazeplam. Reports taking the medications as prescribed and denies adverse side effects. No suicidal ideations. Symptoms are generally well controlled with the current medication regimen and followed by Dr. Alphonzo Grieve.   2.) Overactive bladder - Currently maintained on Myrbetriq. Reports taking the medication as prescribed and denies adverse side effects. Continues to have have urinary frequency that remains labile. Has been seen by gynecology in the past with recommendation of surgery. Does not drink a lot of fluid and trys to avoid caffiene.   3.) Bilateral knee pain - Associated symptom of bilateral knee pain has been going on for several months and describes difficulty getting up and out of chairs. Pains are described as achy. Aggravated when standing for long periods of time. Modifying factors include Sam-E which has not helped very much. Also has taken 600 mg of ibuprofen which has not helped very much. No imaging has been done. No sounds or sensations heard or felt. No recent weight changes.  Wt Readings from Last 3 Encounters:  03/02/17 (!) 302 lb (137 kg)  05/09/16 295 lb (133.8 kg)  12/01/14 280 lb (127 kg)     Allergies  Allergen Reactions  . Codeine Itching  . Penicillins Itching  . Sulfa Antibiotics Swelling      Outpatient Medications Prior to Visit  Medication Sig Dispense Refill  . albuterol (PROVENTIL HFA;VENTOLIN HFA) 108 (90 Base) MCG/ACT inhaler Inhale 2 puffs into the lungs every 4  (four) hours as needed for wheezing or shortness of breath. 1 Inhaler 0  . buPROPion (WELLBUTRIN XL) 150 MG 24 hr tablet Take 150 mg by mouth daily.   3  . cloNIDine (CATAPRES) 0.1 MG tablet Take 0.1 mg by mouth at bedtime.   3  . FLUoxetine (PROZAC) 20 MG capsule Take 60 mg by mouth daily.  4  . topiramate (TOPAMAX) 50 MG tablet Take 50 mg by mouth at bedtime.   3  . zolpidem (AMBIEN) 10 MG tablet Take 10 mg by mouth at bedtime as needed for sleep.    . traZODone (DESYREL) 150 MG tablet Take 150 mg by mouth at bedtime.    . ALPRAZolam (XANAX) 0.25 MG tablet Take 0.25 mg by mouth 2 (two) times daily as needed.  4  . azithromycin (ZITHROMAX) 250 MG tablet Take 1 tablet (250 mg total) by mouth daily. Take first 2 tablets together, then 1 every day until finished. (Patient not taking: Reported on 03/02/2017) 6 tablet 0  . benzonatate (TESSALON) 100 MG capsule Take 1 capsule (100 mg total) by mouth 3 (three) times daily as needed for cough. (Patient not taking: Reported on 03/02/2017) 21 capsule 0  . divalproex (DEPAKOTE ER) 500 MG 24 hr tablet Take 1,000 mg by mouth 2 (two) times daily.    Marland Kitchen oxybutynin (DITROPAN XL) 10 MG 24 hr tablet Take 10 mg by mouth daily.    . pantoprazole (PROTONIX) 20 MG tablet Take 20 mg by mouth daily.  0  No facility-administered medications prior to visit.       History reviewed. No pertinent surgical history.    Past Medical History:  Diagnosis Date  . Bipolar 2 disorder ([place])   . Depression   . Morbid obesity ([place])       Review of Systems  Constitutional: Negative for chills and fever.  Respiratory: Negative for chest tightness and shortness of breath.   Genitourinary: Positive for frequency. Negative for dysuria, flank pain, hematuria and urgency.  Musculoskeletal:       Positive for bilateral knee pain      Objective:    BP 132/86 (BP Location: Left Arm, Patient Position: Sitting, Cuff Size: Large)   Pulse 94   Temp 98.5 F (36.9 C) (Oral)   Ht  5' 6"  (1.676 m)   Wt (!) 302 lb (137 kg)   SpO2 96%   BMI 48.74 kg/m  Nursing note and vital signs reviewed.  Physical Exam  Constitutional: She is oriented to person, place, and time. She appears well-developed and well-nourished. No distress.  Cardiovascular: Normal rate, regular rhythm, normal heart sounds and intact distal pulses.   Pulmonary/Chest: Effort normal and breath sounds normal.  Musculoskeletal:  Bilateral knees -  no obvious deformity, discoloration, or edema. Palpable tenderness along the anterior medial aspect of both knees with no crepitus or deformity. Range of motion within normal limits. Ligamentous and meniscal testing are negative. Strength is 4-5+.  Neurological: She is alert and oriented to person, place, and time.  Skin: Skin is warm and dry.  Psychiatric: She has a normal mood and affect. Her behavior is normal. Judgment and thought content normal.       Assessment & Plan:   Problem List Items Addressed This Visit      Other   Bipolar 2 disorder ([place]) - Primary    Bipolar appears adequately controlled current medication regimen and no adverse side effects. Obtain CBC and complete metabolic profile for therapeutic drug monitoring. Referral placed to previous psychiatrist for continued care.      Relevant Orders   Ambulatory referral to Psychiatry   Urinary frequency    Urinary frequency with concern for overactive bladder with only mild/moderate improvement with Myrbetriq and treatment failure with oxybutinin. Working on adjusting fluids, however symptoms do affect her lifestyle. Refer to urology for further assessment and treatment.      Relevant Orders   Ambulatory referral to Urology   Acute pain of both knees    Pain of bilateral knees most likely associated with primary osteoarthritis with weight being a significant factor she weighs currently 302 pounds and is continued to progressively gained weight over the past several years. No trauma.  Recommend conservative treatment with ice, home exercise therapy, and start meloxicam. Obtain x-rays. Follow-up if symptoms worsen or do not improve pending x-ray results.      Relevant Orders   DG Knee Complete 4 Views Left   DG Knee Complete 4 Views Right   Class 3 severe obesity due to excess calories without serious comorbidity with body mass index (BMI) of 45.0 to 49.9 in adult (HCC)    BMI of 48. Recommend weight loss of 5-10% of current body weight. Recommend increasing physical activity to 30 minutes of moderate level activity daily. Encourage nutritional intake that focuses on nutrient dense foods and is moderate, varied, and balanced and is low in saturated fats and processed/sugary foods. Continue to monitor.       Relevant Medications   lisdexamfetamine (VYVANSE)  50 MG capsule    Other Visit Diagnoses    Therapeutic drug monitoring       Relevant Orders   CBC (Completed)   Comp Met (CMET) (Completed)       I have discontinued Ms. Noll's divalproex, traZODone, oxybutynin, pantoprazole, ALPRAZolam, azithromycin, and benzonatate. I am also having her start on meloxicam. Additionally, I am having her maintain her zolpidem, buPROPion, cloNIDine, FLUoxetine, topiramate, albuterol, mirabegron ER, clonazePAM, and lisdexamfetamine.   Meds ordered this encounter  Medications  . mirabegron ER (MYRBETRIQ) 50 MG TB24 tablet    Sig: Take 50 mg by mouth daily.  . clonazePAM (KLONOPIN) 1 MG tablet    Sig: Take 1 mg by mouth 2 (two) times daily.  Marland Kitchen lisdexamfetamine (VYVANSE) 50 MG capsule    Sig: Take 50 mg by mouth daily.  . meloxicam (MOBIC) 15 MG tablet    Sig: Take 1 tablet (15 mg total) by mouth daily.    Dispense:  30 tablet    Refill:  0    Order Specific Question:   Supervising Provider    Answer:   Pricilla Holm A [[id number]]     Follow-up: Return in about 3 months (around 06/02/2017), or if symptoms worsen or fail to improve.  Mauricio Po, FNP

## 2017-03-02 NOTE — Assessment & Plan Note (Signed)
Pain of bilateral knees most likely associated with primary osteoarthritis with weight being a significant factor she weighs currently 302 pounds and is continued to progressively gained weight over the past several years. No trauma. Recommend conservative treatment with ice, home exercise therapy, and start meloxicam. Obtain x-rays. Follow-up if symptoms worsen or do not improve pending x-ray results.

## 2017-03-02 NOTE — Assessment & Plan Note (Signed)
Bipolar appears adequately controlled current medication regimen and no adverse side effects. Obtain CBC and complete metabolic profile for therapeutic drug monitoring. Referral placed to previous psychiatrist for continued care.

## 2017-03-02 NOTE — Assessment & Plan Note (Signed)
Urinary frequency with concern for overactive bladder with only mild/moderate improvement with Myrbetriq and treatment failure with oxybutinin. Working on adjusting fluids, however symptoms do affect her lifestyle. Refer to urology for further assessment and treatment.

## 2017-03-02 NOTE — Assessment & Plan Note (Signed)
BMI of 48. Recommend weight loss of 5-10% of current body weight. Recommend increasing physical activity to 30 minutes of moderate level activity daily. Encourage nutritional intake that focuses on nutrient dense foods and is moderate, varied, and balanced and is low in saturated fats and processed/sugary foods. Continue to monitor.

## 2017-03-02 NOTE — Patient Instructions (Signed)
Thank you for choosing [facility]!  Ice  x 20 minutes every 2 hours and as needed or following activity  Exercises 1-2 times per day as instructed.   They will call with your appointment for urology.   Medications  Start meloxicam daily - no other NSAID (ibuporfen or Aleve) with this medication. May take Tylenol as needed.   Your prescription(s) have been submitted to your pharmacy or been printed and provided for you. Please take as directed and contact our office if you believe you are having problem(s) with the medication(s) or have any questions.  Lab  Please stop by the lab on the lower level of the building for your blood work. Your results will be released to MyChart (or called to you) after review, usually within 72 hours after test completion. If any changes need to be made, you will be notified at that same time.  1.) The lab is open from 7:30am to 5:30 pm Monday-Friday 2.) No appointment is necessary 3.) Fasting (if needed) is 6-8 hours after food and drink; black coffee and water are okay   Imaging  Please stop by radiology on the basement level of the building for your x-rays. Your results will be released to MyChart (or called to you) after review, usually within 72 hours after test completion. If any treatments or changes are necessary, you will be notified at that same time.  Referrals  Referrals have been made during this visit. You should expect to hear back from our schedulers in about 7-10 days in regards to establishing an appointment with the specialists we discussed.   If your symptoms worsen or fail to improve, please contact our office for further instruction, or in case of emergency go directly to the emergency room at the closest medical facility.

## 2017-03-02 NOTE — Progress Notes (Signed)
Pre visit review using our clinic review tool, if applicable. No additional management support is needed unless otherwise documented below in the visit note. 

## 2017-03-23 ENCOUNTER — Telehealth: Payer: Self-pay | Admitting: Family

## 2017-03-23 NOTE — Telephone Encounter (Signed)
Pt states her mirabegron ER (MYRBETRIQ) 50 MG TB24 tablet Is not working at all and would like to try something else.  [facility] [address]

## 2017-03-24 NOTE — Telephone Encounter (Signed)
Routing to greg, please advise, thanks 

## 2017-03-27 MED ORDER — OXYBUTYNIN CHLORIDE ER 10 MG PO TB24: 10.0000 mg | ORAL_TABLET | Freq: Every day | ORAL | 0 refills | Status: DC

## 2017-03-27 NOTE — Telephone Encounter (Signed)
Oxybutinin sent to pharmacy. Would recommend testing for sleep apnea and possible referral to Urology if symptoms do not improve.

## 2017-03-28 ENCOUNTER — Emergency Department (HOSPITAL_COMMUNITY)
Admission: EM | Admit: 2017-03-28 | Discharge: 2017-03-28 | Disposition: A | Discharge status: Home / Self Care | Payer: Medicare Other | Attending: Emergency Medicine | Admitting: Emergency Medicine

## 2017-03-28 ENCOUNTER — Emergency Department (HOSPITAL_COMMUNITY): Payer: Medicare Other

## 2017-03-28 ENCOUNTER — Encounter (HOSPITAL_COMMUNITY): Payer: Self-pay

## 2017-03-28 ENCOUNTER — Ambulatory Visit: Payer: 59 | Admitting: Internal Medicine

## 2017-03-28 DIAGNOSIS — K8080 Other cholelithiasis without obstruction: Secondary | ICD-10-CM | POA: Insufficient documentation

## 2017-03-28 DIAGNOSIS — N390 Urinary tract infection, site not specified: Secondary | ICD-10-CM | POA: Diagnosis not present

## 2017-03-28 DIAGNOSIS — K8020 Calculus of gallbladder without cholecystitis without obstruction: Secondary | ICD-10-CM

## 2017-03-28 DIAGNOSIS — Z79899 Other long term (current) drug therapy: Secondary | ICD-10-CM | POA: Insufficient documentation

## 2017-03-28 DIAGNOSIS — R1010 Upper abdominal pain, unspecified: Secondary | ICD-10-CM

## 2017-03-28 DIAGNOSIS — R112 Nausea with vomiting, unspecified: Secondary | ICD-10-CM | POA: Diagnosis present

## 2017-03-28 LAB — COMPREHENSIVE METABOLIC PANEL
ALBUMIN: 3.3 g/dL — AB (ref 3.5–5.0)
ALK PHOS: 82 U/L (ref 38–126)
ALT: 57 U/L — AB (ref 14–54)
ANION GAP: 13 (ref 5–15)
AST: 48 U/L — ABNORMAL HIGH (ref 15–41)
BUN: 13 mg/dL (ref 6–20)
CALCIUM: 10.5 mg/dL — AB (ref 8.9–10.3)
CHLORIDE: 104 mmol/L (ref 101–111)
CO2: 17 mmol/L — AB (ref 22–32)
Creatinine, Ser: 1.43 mg/dL — ABNORMAL HIGH (ref 0.44–1.00)
GFR calc Af Amer: 48 mL/min — ABNORMAL LOW (ref >60)
GFR calc non Af Amer: 42 mL/min — ABNORMAL LOW (ref >60)
GLUCOSE: 100 mg/dL — AB (ref 65–99)
Potassium: 3.3 mmol/L — ABNORMAL LOW (ref 3.5–5.1)
SODIUM: 134 mmol/L — AB (ref 135–145)
Total Bilirubin: 0.5 mg/dL (ref 0.3–1.2)
Total Protein: 8.1 g/dL (ref 6.5–8.1)

## 2017-03-28 LAB — CBC
HCT: 34.6 % — ABNORMAL LOW (ref 36.0–46.0)
HEMOGLOBIN: 11.1 g/dL — AB (ref 12.0–15.0)
MCH: 24.2 pg — AB (ref 26.0–34.0)
MCHC: 32.1 g/dL (ref 30.0–36.0)
MCV: 75.4 fL — AB (ref 78.0–100.0)
Platelets: 257 10*3/uL (ref 150–400)
RBC: 4.59 MIL/uL (ref 3.87–5.11)
RDW: 13.7 % (ref 11.5–15.5)
WBC: 12 10*3/uL — ABNORMAL HIGH (ref 4.0–10.5)

## 2017-03-28 LAB — URINALYSIS, ROUTINE W REFLEX MICROSCOPIC
BILIRUBIN URINE: NEGATIVE
Glucose, UA: NEGATIVE mg/dL
KETONES UR: NEGATIVE mg/dL
Nitrite: NEGATIVE
PH: 6 (ref 5.0–8.0)
PROTEIN: 30 mg/dL — AB
Specific Gravity, Urine: 1.013 (ref 1.005–1.030)

## 2017-03-28 LAB — LIPASE, BLOOD: Lipase: 19 U/L (ref 11–51)

## 2017-03-28 MED ORDER — SODIUM CHLORIDE 0.9 % IV BOLUS (SEPSIS)
1000.0000 mL | Freq: Once | INTRAVENOUS | Status: AC
  Administered 2017-03-28: 1000 mL via INTRAVENOUS

## 2017-03-28 MED ORDER — ONDANSETRON 4 MG PO TBDP: 4.0000 mg | ORAL_TABLET | Freq: Three times a day (TID) | ORAL | 0 refills | Status: DC | PRN

## 2017-03-28 MED ORDER — CEPHALEXIN 500 MG PO CAPS: 500.0000 mg | ORAL_CAPSULE | Freq: Two times a day (BID) | ORAL | 0 refills | Status: DC

## 2017-03-28 NOTE — ED Notes (Signed)
ED Provider at bedside. 

## 2017-03-28 NOTE — ED Notes (Signed)
Pt in xray

## 2017-03-28 NOTE — ED Provider Notes (Signed)
[facility]-EMER[facility]ENCY DEPT Provider Note   CSN: [id number] Arrival date & time: 03/28/17  1036     History   Chief Complaint Chief Complaint  Patient presents with  . Shortness of Breath  . Abdominal Pain    HPI Shannon Sharp is a 51 y.o. female.  51 year old female who presents with abdominal pain and weakness. Patient states that one week ago she began having nausea and vomiting that resolved but she has continued to have upper abdominal pain across her abdomen that is worse with certain movements, worse right side versus left side. She has had no ongoing nausea and vomiting. She reports increased urinary frequency and some mild increased thirst but no dysuria or hematuria. No fevers or cough/cold symptoms. No chest pain, diarrhea, or vaginal bleeding/discharge. She reports some mild shortness of breath when she is talking but none otherwise. She reports associated decreased appetite and fatigue, no changes in her weight.   The history is provided by the patient.  Shortness of Breath  Associated symptoms include abdominal pain.  Abdominal Pain      Past Medical History:  Diagnosis Date  . Bipolar 2 disorder (HCC)   . Depression   . Morbid obesity [facility])     Patient Active Problem List   Diagnosis Date Noted  . Urinary frequency 03/02/2017  . Acute pain of both knees 03/02/2017  . Class 3 severe obesity due to excess calories without serious comorbidity with body mass index (BMI) of 45.0 to 49.9 in adult (HCC) 03/02/2017  . OBESITY, NOS 12/28/2006  . ANEMIA, IRON DEFICIENCY, UNSPEC. 12/28/2006  . DEPRESSION, MAJOR, RECURRENT 12/28/2006  . Bipolar 2 disorder (HCC) 12/28/2006  . ANXIETY 12/28/2006  . PANIC ATTACKS 12/28/2006  . OBSESSIVE COMPUL. DISORDER 12/28/2006  . ATTENTION DEFICIT, W/HYPERACTIVITY 12/28/2006  . HEARING LOSS NOS OR DEAFNESS 12/28/2006  . ECZEMA, ATOPIC DERMATITIS 12/28/2006  . INSOMNIA NOS 12/28/2006    History reviewed. No pertinent surgical  history.  OB History    No data available       Home Medications    Prior to Admission medications   Medication Sig Start Date End Date Taking? Authorizing Provider  albuterol (PROVENTIL HFA;VENTOLIN HFA) 108 (90 Base) MCG/ACT inhaler Inhale 2 puffs into the lungs every 4 (four) hours as needed for wheezing or shortness of breath. 05/09/16   Cheri Fowler, PA-C  buPROPion (WELLBUTRIN XL) 150 MG 24 hr tablet Take 150 mg by mouth daily.  10/14/14   [provider]  cephALEXin (KEFLEX) 500 MG capsule Take 1 capsule (500 mg total) by mouth 2 (two) times daily. 03/28/17   Little, Ambrose Finland, MD  clonazePAM (KLONOPIN) 1 MG tablet Take 1 mg by mouth 2 (two) times daily.    [provider]  cloNIDine (CATAPRES) 0.1 MG tablet Take 0.1 mg by mouth at bedtime.  11/03/14   [provider]  FLUoxetine (PROZAC) 20 MG capsule Take 60 mg by mouth daily. 11/18/14   [provider]  lisdexamfetamine (VYVANSE) 50 MG capsule Take 50 mg by mouth daily.    [provider]  meloxicam (MOBIC) 15 MG tablet Take 1 tablet (15 mg total) by mouth daily. 03/02/17   Veryl Speak, FNP  ondansetron (ZOFRAN ODT) 4 MG disintegrating tablet Take 1 tablet (4 mg total) by mouth every 8 (eight) hours as needed for nausea or vomiting. 03/28/17   Little, Ambrose Finland, MD  oxybutynin (DITROPAN XL) 10 MG 24 hr tablet Take 1 tablet (10 mg total) by  mouth at bedtime. 03/27/17   Veryl Speak, FNP  topiramate (TOPAMAX) 50 MG tablet Take 50 mg by mouth at bedtime.  11/03/14   [provider]  zolpidem (AMBIEN) 10 MG tablet Take 10 mg by mouth at bedtime as needed for sleep.    [provider]    Family History Family History  Problem Relation Age of Onset  . Hypertension Mother   . Hypertension Father     Social History Social History  Substance Use Topics  . Smoking status: Never Smoker  . Smokeless tobacco: Never Used  . Alcohol use No     Allergies     Codeine; Penicillins; and Sulfa antibiotics   Review of Systems Review of Systems  Respiratory: Positive for shortness of breath.   Gastrointestinal: Positive for abdominal pain.   All other systems reviewed and are negative except that which was mentioned in HPI  Physical Exam Updated Vital Signs BP (!) 119/59   Pulse 71   Temp 98.4 F (36.9 C) (Oral)   Resp (!) 21   Ht 5\' 6"  (1.676 m)   Wt (!) 138.3 kg (305 lb)   SpO2 100%   BMI 49.23 kg/m   Physical Exam  Constitutional: She is oriented to person, place, and time. She appears well-developed and well-nourished. No distress.  HENT:  Head: Normocephalic and atraumatic.  dry mucous membranes  Eyes: Conjunctivae are normal.  Neck: Neck supple.  Cardiovascular: Normal rate, regular rhythm and normal heart sounds.   No murmur heard. Pulmonary/Chest: Effort normal and breath sounds normal.  Abdominal: Soft. Bowel sounds are normal. She exhibits no distension. There is no tenderness.  Musculoskeletal: She exhibits edema (trace BLE).  Neurological: She is alert and oriented to person, place, and time.  Fluent speech  Skin: Skin is warm and dry.  Psychiatric: She has a normal mood and affect. Judgment normal.  Nursing note and vitals reviewed.    ED Treatments / Results  Labs (all labs ordered are listed, but only abnormal results are displayed) Labs Reviewed  COMPREHENSIVE METABOLIC PANEL - Abnormal; Notable for the following:       Result Value   Sodium 134 (*)    Potassium 3.3 (*)    CO2 17 (*)    Glucose, Bld 100 (*)    Creatinine, Ser 1.43 (*)    Calcium 10.5 (*)    Albumin 3.3 (*)    AST 48 (*)    ALT 57 (*)    GFR calc non Af Amer 42 (*)    GFR calc Af Amer 48 (*)    All other components within normal limits  CBC - Abnormal; Notable for the following:    WBC 12.0 (*)    Hemoglobin 11.1 (*)    HCT 34.6 (*)    MCV 75.4 (*)    MCH 24.2 (*)    All other components within normal limits  URINALYSIS,  ROUTINE W REFLEX MICROSCOPIC - Abnormal; Notable for the following:    APPearance HAZY (*)    Hgb urine dipstick MODERATE (*)    Protein, ur 30 (*)    Leukocytes, UA LARGE (*)    Bacteria, UA FEW (*)    Squamous Epithelial / LPF 0-5 (*)    All other components within normal limits  URINE CULTURE  LIPASE, BLOOD    EKG  EKG Interpretation  Date/Time:  Tuesday Mar 28 2017 10:42:19 EDT Ventricular Rate:  85 PR Interval:  140 QRS Duration: 70 QT  Interval:  360 QTC Calculation: 428 R Axis:   74 Text Interpretation:  Normal sinus rhythm Nonspecific T wave abnormality Abnormal ECG similar to previous Confirmed by Frederick PeersLittle, Rachel [phone]([id number]) on 03/28/2017 11:30:22 AM       Radiology Dg Chest 2 View  Result Date: 03/28/2017 CLINICAL DATA:  Chest pain.  Shortness of breath . EXAM: CHEST  2 VIEW COMPARISON:  05/09/2016 . FINDINGS: Mediastinum and hilar structures are normal. Bibasilar pleural-parenchymal thickening noted consistent with scarring. No pleural effusion or pneumothorax. Degenerative changes thoracic spine. IMPRESSION: Bilateral pleural-parenchymal thickening consistent with scarring. Chest is stable from prior exam . Electronically Signed   By: Maisie Fushomas  Register   On: 03/28/2017 11:41   [place] Abdomen Limited Ruq  Result Date: 03/28/2017 CLINICAL DATA:  Initial evaluation for acute right upper quadrant pain for 1 week. EXAM: US ABDOMEN LIMITED - RIGHT UPPER QUADRANT COMPARISON:  None. FINDINGS: Gallbladder: Shadowing echogenic stones measuring up to 1.3 cm present within the gallbladder lumen. Gallbladder wall measure within normal limits at 2.4 mm. No free pericholecystic fluid. No sonographic Murphy sign elicited on exam. Common bile duct: Diameter: 4.8 mm Liver: No focal lesion identified. Within normal limits in parenchymal echogenicity. IMPRESSION: 1. Cholelithiasis without sonographic features for acute cholecystitis. 2. No biliary dilatation. Electronically Signed   By: Rise MuBenjamin   McClintock M.D.   On: 03/28/2017 13:12    Procedures Procedures (including critical care time)  Medications Ordered in ED Medications  sodium chloride 0.9 % bolus 1,000 mL (0 mLs Intravenous Stopped 03/28/17 1416)     Initial Impression / Assessment and Plan / ED Course  I have reviewed the triage vital signs and the nursing notes.  Pertinent labs & imaging results that were available during my care of the patient were reviewed by me and considered in my medical decision making (see chart for details).     Pt w/ nausea and vomiting one week ago as well as upper abdominal pain, no focal tenderness on exam. Creatinine mildly elevated at 1.43, glucose 100, AST 48, ALT 57, normal bilirubin. WBC 12, hemoglobin 11, normal lipase. Gave IV fluid bolus and obtained ultrasound because of abnormal LFTs.  Labwork shows UA consistent with infection, urine culture sent. Ultrasound shows cholelithiasis without evidence of cholecystitis. Patient denying any symptoms on reexamination and abdomen is nontender on repeat exam. I discussed supportive measures for her symptomatically cholelithiasis and provided her with general surgery follow-up information as well as nausea medication to use as needed at home. Regarding her UTI, I gave her Keflex but she is otherwise well-appearing with no evidence of pyelonephritis or serious infection therefore I feel she is appropriate for outpatient management. She has voiced understanding of return precautions and follow-up plan. Patient discharged in satisfactory condition.  Final Clinical Impressions(s) / ED Diagnoses   Final diagnoses:  Upper abdominal pain  Urinary tract infection without hematuria, site unspecified  Symptomatic cholelithiasis    New Prescriptions Discharge Medication List as of 03/28/2017  1:59 PM    START taking these medications   Details  cephALEXin (KEFLEX) 500 MG capsule Take 1 capsule (500 mg total) by mouth 2 (two) times daily.,  Starting Tue 03/28/2017, Print    ondansetron (ZOFRAN ODT) 4 MG disintegrating tablet Take 1 tablet (4 mg total) by mouth every 8 (eight) hours as needed for nausea or vomiting., Starting Tue 03/28/2017, Print         Little, Ambrose Finlandachel Morgan, MD 03/28/17 [phone]

## 2017-03-28 NOTE — Telephone Encounter (Signed)
Left message advising patient of greg's note/instructions, call back if this rx does not work well, we may need further testing per greg's note

## 2017-03-28 NOTE — ED Notes (Signed)
Pt aware of need for urine sample.  

## 2017-03-28 NOTE — ED Triage Notes (Signed)
Per Pt, Pt started to have upper abdominal pain, N/V a week ago. Pt reports that she started to have some SOB and weakness. Denies diarrhea or vaginal discharge. Reports some urinary frequency.

## 2017-03-28 NOTE — ED Notes (Signed)
Patient transported to Ultrasound 

## 2017-03-30 LAB — URINE CULTURE: Culture: 80000 — AB

## 2017-03-31 ENCOUNTER — Telehealth: Payer: Self-pay | Admitting: *Deleted

## 2017-03-31 NOTE — Telephone Encounter (Signed)
Post ED Visit - Positive Culture Follow-up  Culture report reviewed by antimicrobial stewardship pharmacist:  [x]  Enzo BiNathan Batchelder, Pharm.D. []  Celedonio MiyamotoJeremy Frens, Pharm.D., BCPS AQ-ID []  Garvin FilaMike Maccia, Pharm.D., BCPS []  Georgina PillionElizabeth Martin, [address]Pharm[address]D., BCPS []  JasperMinh Pham, [place]arm.D., BCPS, AAHIVP []  Estella HuskMichelle Turner, Pharm.D., BCPS, AAHIVP []  Lysle Pearlachel Rumbarger, PharmD, BCPS []  Casilda Carlsaylor Stone, PharmD, BCPS []  Pollyann SamplesAndy Johnston, PharmD, BCPS  Positive urine culture Treated with Cephalexin, organism sensitive to the same and no further patient follow-up is required at this time.  Virl AxeRobertson, Milinda Sweeney [facility] 03/31/2017, 11:16 AM

## 2017-04-14 ENCOUNTER — Other Ambulatory Visit: Payer: Self-pay | Admitting: Family

## 2017-04-17 MED ORDER — MELOXICAM 15 MG PO TABS: 15.0000 mg | ORAL_TABLET | Freq: Every day | ORAL | 0 refills | Status: DC

## 2017-04-17 MED ORDER — OXYBUTYNIN CHLORIDE ER 10 MG PO TB24: 10.0000 mg | ORAL_TABLET | Freq: Every day | ORAL | 5 refills | Status: DC

## 2017-04-25 ENCOUNTER — Encounter: Payer: Self-pay | Admitting: Family

## 2017-05-06 ENCOUNTER — Telehealth: Payer: Self-pay | Admitting: Family

## 2017-05-08 ENCOUNTER — Ambulatory Visit (INDEPENDENT_AMBULATORY_CARE_PROVIDER_SITE_OTHER): Payer: Medicare Other | Admitting: Family

## 2017-05-08 ENCOUNTER — Encounter: Payer: Self-pay | Admitting: Family

## 2017-05-08 VITALS — BP 132/84 | HR 62 | Temp 98.7°F | Resp 18 | Ht 66.0 in | Wt 289.0 lb

## 2017-05-08 DIAGNOSIS — K2100 Gastro-esophageal reflux disease with esophagitis, without bleeding: Secondary | ICD-10-CM | POA: Insufficient documentation

## 2017-05-08 DIAGNOSIS — F5104 Psychophysiologic insomnia: Secondary | ICD-10-CM

## 2017-05-08 DIAGNOSIS — J302 Other seasonal allergic rhinitis: Secondary | ICD-10-CM | POA: Diagnosis not present

## 2017-05-08 MED ORDER — ALBUTEROL SULFATE HFA 108 (90 BASE) MCG/ACT IN AERS: 2.0000 | INHALATION_SPRAY | RESPIRATORY_TRACT | 0 refills | Status: DC | PRN

## 2017-05-08 MED ORDER — TRIAZOLAM 0.25 MG PO TABS: 0.2500 mg | ORAL_TABLET | Freq: Every evening | ORAL | 0 refills | Status: DC | PRN

## 2017-05-08 MED ORDER — PANTOPRAZOLE SODIUM 40 MG PO TBEC: 40.0000 mg | DELAYED_RELEASE_TABLET | Freq: Every day | ORAL | 1 refills | Status: DC

## 2017-05-08 MED ORDER — CLONAZEPAM 1 MG PO TABS: 1.0000 mg | ORAL_TABLET | Freq: Two times a day (BID) | ORAL | 1 refills | Status: DC

## 2017-05-08 MED ORDER — PREDNISONE 20 MG PO TABS: 20.0000 mg | ORAL_TABLET | Freq: Every day | ORAL | 0 refills | Status: DC

## 2017-05-08 NOTE — Assessment & Plan Note (Addendum)
Insomnia appears stable with current medication regimen and no adverse side effects. Continue current dosage of Ambien and triazolam. [place] substance database reviewed with no irregularities. Encourage good sleep hygiene. Continue to monitor.

## 2017-05-08 NOTE — Assessment & Plan Note (Signed)
Symptoms and exam consistent with allergic rhinitis most likely related to the environment. No evidence of bacterial infection. Recommend over-the-counter second-generation antihistamine and nasal corticosteroid. Start prednisone. Follow-up if symptoms worsen or do not improve.

## 2017-05-08 NOTE — Assessment & Plan Note (Signed)
Symptoms and exam are consistent with exacerbation of gastroesophageal reflux currently uncontrolled. Start pantoprazole. Information on GERD-related diet provided and after visit summary. Follow-up if symptoms worsen or do not improve.

## 2017-05-08 NOTE — Patient Instructions (Addendum)
Thank you for choosing [facility].  SUMMARY AND INSTRUCTIONS:  Moisture with Dove, Aveeno or Eucerin products.  Clartain, Allegra, Zyrtec or Xyzal in addition to [employer].  Prednisone for the next couple of days.   Take the pantoprazole for your reflux.   Continue to take your medication as prescribed.  Medication:  Your prescription(s) have been submitted to your pharmacy or been printed and provided for you. Please take as directed and contact our office if you believe you are having problem(s) with the medication(s) or have any questions.  Follow up:  If your symptoms worsen or fail to improve, please contact our office for further instruction, or in case of emergency go directly to the emergency room at the closest medical facility.    General Recommendations:    Please drink plenty of fluids.  Get plenty of rest   Sleep in humidified air  Use saline nasal sprays  Netti pot   OTC Medications:  Decongestants - helps relieve congestion   Flonase (generic fluticasone) or Nasacort (generic triamcinolone) - please make sure to use the "cross-over" technique at a 45 degree angle towards the opposite eye as opposed to straight up the nasal passageway.   Sudafed (generic pseudoephedrine - Note this is the one that is available behind the pharmacy counter); Products with phenylephrine (-PE) may also be used but is often not as effective as pseudoephedrine.   If you have HIGH BLOOD PRESSURE - Coricidin HBP; AVOID any product that is -D as this contains pseudoephedrine which may increase your blood pressure.  Afrin (oxymetazoline) every 6-8 hours for up to 3 days.   Allergies - helps relieve runny nose, itchy eyes and sneezing   Claritin (generic loratidine), Allegra (fexofenidine), or Zyrtec (generic cyrterizine) for runny nose. These medications should not cause drowsiness.  Note - Benadryl (generic diphenhydramine) may be used however may cause  drowsiness  Cough -   Delsym or Robitussin (generic dextromethorphan)  Expectorants - helps loosen mucus to ease removal   Mucinex (generic guaifenesin) as directed on the package.  Headaches / General Aches   Tylenol (generic acetaminophen) - DO NOT EXCEED 3 grams (3,000 mg) in a 24 hour time period  Advil/Motrin (generic ibuprofen)   Sore Throat -   Salt water gargle   Chloraseptic (generic benzocaine) spray or lozenges / Sucrets (generic dyclonine)       Food Choices for Gastroesophageal Reflux Disease, Adult When you have gastroesophageal reflux disease (GERD), the foods you eat and your eating habits are very important. Choosing the right foods can help ease your discomfort. What guidelines do I need to follow?  Choose fruits, vegetables, whole grains, and low-fat dairy products.  Choose low-fat meat, fish, and poultry.  Limit fats such as oils, salad dressings, butter, nuts, and avocado.  Keep a food diary. This helps you identify foods that cause symptoms.  Avoid foods that cause symptoms. These may be different for everyone.  Eat small meals often instead of 3 large meals a day.  Eat your meals slowly, in a place where you are relaxed.  Limit fried foods.  Cook foods using methods other than frying.  Avoid drinking alcohol.  Avoid drinking large amounts of liquids with your meals.  Avoid bending over or lying down until 2-3 hours after eating. What foods are not recommended? These are some foods and drinks that may make your symptoms worse: Vegetables Tomatoes. Tomato juice. Tomato and spaghetti sauce. Chili peppers. Onion and garlic. Horseradish. Fruits Oranges, grapefruit, and  lemon (fruit and juice). Meats High-fat meats, fish, and poultry. This includes hot dogs, ribs, ham, sausage, salami, and bacon. Dairy Whole milk and chocolate milk. Sour cream. Cream. Butter. Ice cream. Cream cheese. Drinks Coffee and tea. Bubbly (carbonated) drinks  or energy drinks. Condiments Hot sauce. Barbecue sauce. Sweets/Desserts Chocolate and cocoa. Donuts. Peppermint and spearmint. Fats and Oils High-fat foods. This includes [place] fries and potato chips. Other Vinegar. Strong spices. This includes black pepper, white pepper, red pepper, cayenne, curry powder, cloves, ginger, and chili powder. The items listed above may not be a complete list of foods and drinks to avoid. Contact your dietitian for more information. This information is not intended to replace advice given to you by your health care provider. Make sure you discuss any questions you have with your health care provider. Document Released: 04/17/2012 Document Revised: 03/24/2016 Document Reviewed: 08/21/2013 Elsevier Interactive Patient Education  2017 [employer]

## 2017-05-08 NOTE — Progress Notes (Signed)
Subjective:    Patient ID: Shannon Sharp, female    DOB: 1965/12/14, 51 y.o.   MRN: [id number]  Chief Complaint  Patient presents with  . Nasal Congestion    x2 weeks, eye irriation, nasal congestion, dry skin, feels like food is hard to go down good when eating and wants to come back up    HPI:  Shannon Sharp is a 51 y.o. female who  has a past medical history of Bipolar 2 disorder (HCC); Depression; and Morbid obesity (HCC). and presents today for an acute office visit.  1.) Congestion - This is a new problem. Associated symptom of eye irritation, nasal congestion, and sneezing has been going on for about 2 weeks. Denies fevers. Modifying factors include sudafed and guaifenesin which have improved slightly since initial onset.   2.) Stomach issues - Associated symptom of increased levels of nausea and fullness have been going on for several days. Denies abdominal pain. Describes a fullness feeling that is exacerbated by eating and the feeling that things "want to come back up." Previously diagnosed with GERD and prescribed omeprazole which she is not currently taking.   3.) Insomnia - Currently maintained on triazolam and Ambien. Reports taking medication as prescribed and denies adverse side effects. Sleeping well with current medication regimen previously prescribed by psychiatry and due to a change in her insurance psychiatry is now out of network for her current insurance plan. Requesting refill of medication while she works establish with new psychiatrist.   Allergies  Allergen Reactions  . Codeine Itching  . Penicillins Itching  . Sulfa Antibiotics Swelling      Outpatient Medications Prior to Visit  Medication Sig Dispense Refill  . buPROPion (WELLBUTRIN XL) 150 MG 24 hr tablet Take 150 mg by mouth daily.   3  . cloNIDine (CATAPRES) 0.1 MG tablet Take 0.1 mg by mouth at bedtime.   3  . FLUoxetine (PROZAC) 20 MG capsule Take 60 mg by mouth daily.  4  .  lisdexamfetamine (VYVANSE) 50 MG capsule Take 40 mg by mouth daily.    . ondansetron (ZOFRAN ODT) 4 MG disintegrating tablet Take 1 tablet (4 mg total) by mouth every 8 (eight) hours as needed for nausea or vomiting. 8 tablet 0  . topiramate (TOPAMAX) 50 MG tablet Take 100 mg by mouth 2 (two) times daily.  3  . zolpidem (AMBIEN) 10 MG tablet Take 10 mg by mouth at bedtime as needed for sleep.    Marland Kitchen albuterol (PROVENTIL HFA;VENTOLIN HFA) 108 (90 Base) MCG/ACT inhaler Inhale 2 puffs into the lungs every 4 (four) hours as needed for wheezing or shortness of breath. 1 Inhaler 0  . cephALEXin (KEFLEX) 500 MG capsule Take 1 capsule (500 mg total) by mouth 2 (two) times daily. 14 capsule 0  . clonazePAM (KLONOPIN) 1 MG tablet Take 1 mg by mouth 2 (two) times daily.    . meloxicam (MOBIC) 15 MG tablet Take 1 tablet (15 mg total) by mouth daily. 30 tablet 0  . oxybutynin (DITROPAN XL) 10 MG 24 hr tablet Take 1 tablet (10 mg total) by mouth at bedtime. 30 tablet 5   No facility-administered medications prior to visit.       No past surgical history on file.    Past Medical History:  Diagnosis Date  . Bipolar 2 disorder (HCC)   . Depression   . Morbid obesity (HCC)       Review of Systems  Constitutional: Negative for chills  and fever.  HENT: Positive for congestion and sneezing. Negative for ear pain, sinus pain, sinus pressure and sore throat.   Eyes: Positive for discharge (Described as cloudy), redness and itching.  Respiratory: Negative for cough, chest tightness and shortness of breath.   Cardiovascular: Negative for chest pain, palpitations and leg swelling.  Gastrointestinal: Positive for nausea. Negative for abdominal pain, blood in stool, constipation, diarrhea and vomiting.  Psychiatric/Behavioral: Negative for dysphoric mood and sleep disturbance.      Objective:    BP 132/84 (BP Location: Left Arm, Patient Position: Sitting, Cuff Size: Large)   Pulse 62   Temp 98.7 F (37.1  C) (Oral)   Resp 18   Ht 5\' 6"  (1.676 m)   Wt 289 lb (131.1 kg)   SpO2 96%   BMI 46.65 kg/m  Nursing note and vital signs reviewed.  Physical Exam  Constitutional: She is oriented to person, place, and time. She appears well-developed and well-nourished. No distress.  HENT:  Right Ear: Hearing, tympanic membrane, external ear and ear canal normal.  Left Ear: Hearing, tympanic membrane, external ear and ear canal normal.  Nose: Nose normal. Right sinus exhibits no maxillary sinus tenderness and no frontal sinus tenderness. Left sinus exhibits no maxillary sinus tenderness and no frontal sinus tenderness.  Mouth/Throat: Uvula is midline, oropharynx is clear and moist and mucous membranes are normal.  Cardiovascular: Normal rate, regular rhythm, normal heart sounds and intact distal pulses.  Exam reveals no gallop and no friction rub.   No murmur heard. Pulmonary/Chest: Effort normal and breath sounds normal. No respiratory distress. She has no wheezes. She has no rales. She exhibits no tenderness.  Abdominal: Soft. Bowel sounds are normal. She exhibits no distension and no mass. There is no tenderness. There is no rebound and no guarding.  Neurological: She is alert and oriented to person, place, and time.  Skin: Skin is warm and dry.  Psychiatric: She has a normal mood and affect. Her behavior is normal. Judgment and thought content normal.       Assessment & Plan:   Problem List Items Addressed This Visit      Respiratory   Seasonal allergic rhinitis - Primary    Symptoms and exam consistent with allergic rhinitis most likely related to the environment. No evidence of bacterial infection. Recommend over-the-counter second-generation antihistamine and nasal corticosteroid. Start prednisone. Follow-up if symptoms worsen or do not improve.        Digestive   Gastroesophageal reflux disease with esophagitis    Symptoms and exam are consistent with exacerbation of gastroesophageal  reflux currently uncontrolled. Start pantoprazole. Information on GERD-related diet provided and after visit summary. Follow-up if symptoms worsen or do not improve.        Other   INSOMNIA NOS    Insomnia appears stable with current medication regimen and no adverse side effects. Continue current dosage of Ambien and triazolam. [place] controlled substance database reviewed with no irregularities. Encourage good sleep hygiene. Continue to monitor.          I have discontinued Ms. Bogen's cephALEXin, meloxicam, and oxybutynin. I have also changed her clonazePAM. Additionally, I am having her start on predniSONE, pantoprazole, and triazolam. Lastly, I am having her maintain her zolpidem, buPROPion, cloNIDine, FLUoxetine, topiramate, lisdexamfetamine, ondansetron, and albuterol.   Meds ordered this encounter  Medications  . predniSONE (DELTASONE) 20 MG tablet    Sig: Take 1 tablet (20 mg total) by mouth daily with breakfast.    Dispense:  6 tablet    Refill:  0    Order Specific Question:   Supervising Provider    Answer:   Hillard DankerRAWFORD, ELIZABETH A [[id number]]  . pantoprazole (PROTONIX) 40 MG tablet    Sig: Take 1 tablet (40 mg total) by mouth daily.    Dispense:  30 tablet    Refill:  1    Order Specific Question:   Supervising Provider    Answer:   Hillard DankerRAWFORD, ELIZABETH A [[id number]]  . triazolam (HALCION) 0.25 MG tablet    Sig: Take 1 tablet (0.25 mg total) by mouth at bedtime as needed for sleep.    Dispense:  30 tablet    Refill:  0    Order Specific Question:   Supervising Provider    Answer:   Hillard DankerRAWFORD, ELIZABETH A [[id number]]  . clonazePAM (KLONOPIN) 1 MG tablet    Sig: Take 1 tablet (1 mg total) by mouth 2 (two) times daily.    Dispense:  30 tablet    Refill:  1    Order Specific Question:   Supervising Provider    Answer:   Hillard DankerRAWFORD, ELIZABETH A [[id number]]  . albuterol (PROVENTIL HFA;VENTOLIN HFA) 108 (90 Base) MCG/ACT inhaler    Sig: Inhale 2 puffs into the lungs every 4 (four)  hours as needed for wheezing or shortness of breath.    Dispense:  1 Inhaler    Refill:  0    Order Specific Question:   Supervising Provider    Answer:   Hillard DankerRAWFORD, ELIZABETH A [[id number]]     Follow-up: Return in about 1 month (around 06/08/2017), or if symptoms worsen or fail to improve.  Jeanine Luzalone, Eloyce Bultman, FNP

## 2017-05-11 ENCOUNTER — Other Ambulatory Visit: Payer: Self-pay | Admitting: Family

## 2017-06-07 ENCOUNTER — Ambulatory Visit (INDEPENDENT_AMBULATORY_CARE_PROVIDER_SITE_OTHER): Payer: Medicare Other | Admitting: Family

## 2017-06-07 ENCOUNTER — Encounter: Payer: Self-pay | Admitting: Family

## 2017-06-07 VITALS — BP 148/82 | HR 98 | Temp 98.7°F | Resp 16 | Ht 66.0 in | Wt 289.0 lb

## 2017-06-07 DIAGNOSIS — K2100 Gastro-esophageal reflux disease with esophagitis, without bleeding: Secondary | ICD-10-CM

## 2017-06-07 DIAGNOSIS — F5104 Psychophysiologic insomnia: Secondary | ICD-10-CM | POA: Diagnosis not present

## 2017-06-07 MED ORDER — PANTOPRAZOLE SODIUM 40 MG PO TBEC: 40.0000 mg | DELAYED_RELEASE_TABLET | Freq: Two times a day (BID) | ORAL | 1 refills | Status: DC

## 2017-06-07 NOTE — Assessment & Plan Note (Signed)
Symptoms remain consistent with insomnia not otherwise specified with previous DNA testing showing no significant medication that will be beneficial. Psychiatry requesting referral to sleep medicine for evaluation. Referral placed. Encourage good sleep hygiene. Continue current dosage of Ambien pending referral to sleep medicine.

## 2017-06-07 NOTE — Assessment & Plan Note (Signed)
Continues to experience symptoms related to gastroesophageal reflux and vomiting refractory to previous pantoprazole. Increase pantoprazole. Encouraged weight loss. There is concern for possible gastroparesis or hiatal hernia. Refer to gastroenterology for further assessment and treatment. Follow-up if symptoms worsen or do not improve.

## 2017-06-07 NOTE — Patient Instructions (Signed)
Thank you for choosing [facility].  SUMMARY AND INSTRUCTIONS:  Please increase your pantoprazole to 1 tablet twice daily.  We have sent referrals to sleep medicine and gastroenterology for follow up.  Work on weight loss to help with your symptoms.  GERD diet information below.   Medication:  Your prescription(s) have been submitted to your pharmacy or been printed and provided for you. Please take as directed and contact our office if you believe you are having problem(s) with the medication(s) or have any questions.  Follow up:  If your symptoms worsen or fail to improve, please contact our office for further instruction, or in case of emergency go directly to the emergency room at the closest medical facility.     Food Choices for Gastroesophageal Reflux Disease, Adult When you have gastroesophageal reflux disease (GERD), the foods you eat and your eating habits are very important. Choosing the right foods can help ease your discomfort. What guidelines do I need to follow?  Choose fruits, vegetables, whole grains, and low-fat dairy products.  Choose low-fat meat, fish, and poultry.  Limit fats such as oils, salad dressings, butter, nuts, and avocado.  Keep a food diary. This helps you identify foods that cause symptoms.  Avoid foods that cause symptoms. These may be different for everyone.  Eat small meals often instead of 3 large meals a day.  Eat your meals slowly, in a place where you are relaxed.  Limit fried foods.  Cook foods using methods other than frying.  Avoid drinking alcohol.  Avoid drinking large amounts of liquids with your meals.  Avoid bending over or lying down until 2-3 hours after eating. What foods are not recommended? These are some foods and drinks that may make your symptoms worse: Vegetables Tomatoes. Tomato juice. Tomato and spaghetti sauce. Chili peppers. Onion and garlic. Horseradish. Fruits Oranges, grapefruit, and lemon  (fruit and juice). Meats High-fat meats, fish, and poultry. This includes hot dogs, ribs, ham, sausage, salami, and bacon. Dairy Whole milk and chocolate milk. Sour cream. Cream. Butter. Ice cream. Cream cheese. Drinks Coffee and tea. Bubbly (carbonated) drinks or energy drinks. Condiments Hot sauce. Barbecue sauce. Sweets/Desserts Chocolate and cocoa. Donuts. Peppermint and spearmint. Fats and Oils High-fat foods. This includes [place] fries and potato chips. Other Vinegar. Strong spices. This includes black pepper, white pepper, red pepper, cayenne, curry powder, cloves, ginger, and chili powder. The items listed above may not be a complete list of foods and drinks to avoid. Contact your dietitian for more information. This information is not intended to replace advice given to you by your health care provider. Make sure you discuss any questions you have with your health care provider. Document Released: 04/17/2012 Document Revised: 03/24/2016 Document Reviewed: 08/21/2013 Elsevier Interactive Patient Education  2017 [employer]

## 2017-06-07 NOTE — Progress Notes (Signed)
Subjective:    Patient ID: Shannon Sharp, female    DOB: April 30, 1966, 51 y.o.   MRN: [id number]  Chief Complaint  Patient presents with  . Gastroesophageal Reflux    having trouble keeping food down, states that she has alot of acid reflux but even when she does not feel the reflux she is having trouble keeping her food down, sleep study    HPI:  Shannon Sharp is a 51 y.o. female who  has a past medical history of Bipolar 2 disorder (HCC); Depression; and Morbid obesity (HCC). and presents today for a follow up office visit.   1.) GERD - Previously started on pantoprazole. Reports taking the medication as prescribed and denies adverse side effects. Notes that her symptoms remain poorly controlled. Severity is effecting her ability to eat as she has had multiple episodes of vomiting. No nausea. Does have constipation with a frequency of 2-3 days and described as hard to pass. Able to keep lighter foods down. Describes freqeuncy of vomiting at least once every other day. No abdominal pain. No blood in stool.   2.) Sleep study - Recently DNA tested by psychiatry and found to have limited availability of medications that would work to help her sleep. Currently sleeping about 3-4 hours and is prescribed Ambien which sometimes helps and sometimes does not. Has difficulty staying asleep and falling asleep. Does snore with no witnessed apneic events. Has also been trialed on Sonata and Belsomra. Psychiatrist requesting evaluation by sleep medicine.   Allergies  Allergen Reactions  . Codeine Itching  . Penicillins Itching  . Sulfa Antibiotics Swelling      Outpatient Medications Prior to Visit  Medication Sig Dispense Refill  . albuterol (PROVENTIL HFA;VENTOLIN HFA) 108 (90 Base) MCG/ACT inhaler Inhale 2 puffs into the lungs every 4 (four) hours as needed for wheezing or shortness of breath. 1 Inhaler 0  . buPROPion (WELLBUTRIN XL) 150 MG 24 hr tablet Take 150 mg by mouth daily.   3   . cloNIDine (CATAPRES) 0.1 MG tablet Take 0.1 mg by mouth at bedtime.   3  . FLUoxetine (PROZAC) 20 MG capsule Take 60 mg by mouth daily.  4  . meloxicam (MOBIC) 15 MG tablet TAKE 1 TABLET BY MOUTH EVERY DAY 30 tablet 2  . ondansetron (ZOFRAN ODT) 4 MG disintegrating tablet Take 1 tablet (4 mg total) by mouth every 8 (eight) hours as needed for nausea or vomiting. 8 tablet 0  . predniSONE (DELTASONE) 20 MG tablet Take 1 tablet (20 mg total) by mouth daily with breakfast. 6 tablet 0  . topiramate (TOPAMAX) 50 MG tablet Take 100 mg by mouth 2 (two) times daily.  3  . triazolam (HALCION) 0.25 MG tablet Take 1 tablet (0.25 mg total) by mouth at bedtime as needed for sleep. 30 tablet 0  . zolpidem (AMBIEN) 10 MG tablet Take 10 mg by mouth at bedtime as needed for sleep.    . pantoprazole (PROTONIX) 40 MG tablet Take 1 tablet (40 mg total) by mouth daily. 30 tablet 1  . clonazePAM (KLONOPIN) 1 MG tablet Take 1 tablet (1 mg total) by mouth 2 (two) times daily. 30 tablet 1  . lisdexamfetamine (VYVANSE) 50 MG capsule Take 40 mg by mouth daily.     No facility-administered medications prior to visit.       No past surgical history on file.    Past Medical History:  Diagnosis Date  . Bipolar 2 disorder (HCC)   .  Depression   . Morbid obesity (HCC)       Review of Systems  Constitutional: Negative for chills and fever.  Respiratory: Negative for chest tightness and shortness of breath.   Cardiovascular: Negative for chest pain, palpitations and leg swelling.  Gastrointestinal: Positive for constipation and vomiting. Negative for abdominal distention, abdominal pain, diarrhea and nausea.  Psychiatric/Behavioral: Positive for sleep disturbance.      Objective:    BP (!) 148/82 (BP Location: Left Arm, Patient Position: Sitting, Cuff Size: Large)   Pulse 98   Temp 98.7 F (37.1 C) (Oral)   Resp 16   Ht 5\' 6"  (1.676 m)   Wt 289 lb (131.1 kg)   SpO2 97%   BMI 46.65 kg/m  Nursing  note and vital signs reviewed.  Physical Exam  Constitutional: She is oriented to person, place, and time. She appears well-developed and well-nourished. No distress.  Obese  Cardiovascular: Normal rate, regular rhythm, normal heart sounds and intact distal pulses.   Pulmonary/Chest: Effort normal and breath sounds normal.  Abdominal: Normal appearance and bowel sounds are normal. She exhibits no mass. There is no hepatosplenomegaly. There is no tenderness. There is no rigidity, no rebound, no guarding, no tenderness at McBurney's point and negative Murphy's sign.  Neurological: She is alert and oriented to person, place, and time.  Skin: Skin is warm and dry.  Psychiatric: She has a normal mood and affect. Her behavior is normal. Judgment and thought content normal.       Assessment & Plan:   Problem List Items Addressed This Visit      Digestive   Gastroesophageal reflux disease with esophagitis    Continues to experience symptoms related to gastroesophageal reflux and vomiting refractory to previous pantoprazole. Increase pantoprazole. Encouraged weight loss. There is concern for possible gastroparesis or hiatal hernia. Refer to gastroenterology for further assessment and treatment. Follow-up if symptoms worsen or do not improve.        Other   INSOMNIA NOS - Primary    Symptoms remain consistent with insomnia not otherwise specified with previous DNA testing showing no significant medication that will be beneficial. Psychiatry requesting referral to sleep medicine for evaluation. Referral placed. Encourage good sleep hygiene. Continue current dosage of Ambien pending referral to sleep medicine.          I have discontinued [name]'s lisdexamfetamine and clonazePAM. I have also changed her pantoprazole. Additionally, I am having her maintain her zolpidem, buPROPion, cloNIDine, FLUoxetine, topiramate, ondansetron, predniSONE, triazolam, albuterol, and meloxicam.   Meds  ordered this encounter  Medications  . pantoprazole (PROTONIX) 40 MG tablet    Sig: Take 1 tablet (40 mg total) by mouth 2 (two) times daily.    Dispense:  60 tablet    Refill:  1    Order Specific Question:   Supervising Provider    Answer:   Hillard DankerRAWFORD, ELIZABETH A [[id number]]     Follow-up: No Follow-up on file.  Jeanine Luzalone, Gregory, FNP

## 2017-06-30 ENCOUNTER — Other Ambulatory Visit: Payer: Self-pay | Admitting: Family

## 2017-07-10 ENCOUNTER — Ambulatory Visit (INDEPENDENT_AMBULATORY_CARE_PROVIDER_SITE_OTHER): Payer: Medicare Other | Admitting: Family

## 2017-07-10 ENCOUNTER — Encounter: Payer: Self-pay | Admitting: Family

## 2017-07-10 VITALS — BP 138/80 | HR 78 | Temp 98.6°F | Resp 16 | Ht 66.0 in | Wt 286.0 lb

## 2017-07-10 DIAGNOSIS — L209 Atopic dermatitis, unspecified: Secondary | ICD-10-CM | POA: Diagnosis not present

## 2017-07-10 MED ORDER — TRIAMCINOLONE ACETONIDE 0.025 % EX CREA: 1.0000 "application " | TOPICAL_CREAM | Freq: Two times a day (BID) | CUTANEOUS | 1 refills | Status: DC

## 2017-07-10 NOTE — Patient Instructions (Signed)
Thank you for choosing [facility].  SUMMARY AND INSTRUCTIONS:  Please continue to take your medications.  Start the triamcinolone cream - use a pinkie-tip size dose. Do not use for more than a week at a time.   If worsening will send to dermatology.  Moisturize with Marice Potterove, Aveeno or Eucerin products.   Eucrissa - thin film twice daily.  Medication:  Your prescription(s) have been submitted to your pharmacy or been printed and provided for you. Please take as directed and contact our office if you believe you are having problem(s) with the medication(s) or have any questions.  Follow up:  If your symptoms worsen or fail to improve, please contact our office for further instruction, or in case of emergency go directly to the emergency room at the closest medical facility.

## 2017-07-10 NOTE — Progress Notes (Signed)
Subjective:    Patient ID: Shannon Sharp, female    DOB: 01/26/1966, 51 y.o.   MRN: [id number]  Chief Complaint  Patient presents with  . Eczema    x3 weeks has had a eczema break out on face    HPI:  Shannon Sharp is a 51 y.o. female who  has a past medical history of Bipolar 2 disorder (HCC); Depression; and Morbid obesity (HCC). and presents today for an acute office visit.  This is a new problem. Associated symptoms of a rash located on her face has been going on for 3 weeks. Described as itchy, and refractory to the modifying factor of OTC creams. No recent moisturizing cream. Course of the symptoms have improved a little and is now peeling.    Allergies  Allergen Reactions  . Codeine Itching  . Penicillins Itching  . Sulfa Antibiotics Swelling    Outpatient Medications Prior to Visit  Medication Sig Dispense Refill  . albuterol (PROVENTIL HFA;VENTOLIN HFA) 108 (90 Base) MCG/ACT inhaler Inhale 2 puffs into the lungs every 4 (four) hours as needed for wheezing or shortness of breath. 1 Inhaler 0  . buPROPion (WELLBUTRIN XL) 150 MG 24 hr tablet Take 150 mg by mouth daily.   3  . cloNIDine (CATAPRES) 0.1 MG tablet Take 0.1 mg by mouth at bedtime.   3  . FLUoxetine (PROZAC) 20 MG capsule Take 60 mg by mouth daily.  4  . meloxicam (MOBIC) 15 MG tablet TAKE 1 TABLET BY MOUTH EVERY DAY 30 tablet 2  . ondansetron (ZOFRAN ODT) 4 MG disintegrating tablet Take 1 tablet (4 mg total) by mouth every 8 (eight) hours as needed for nausea or vomiting. 8 tablet 0  . pantoprazole (PROTONIX) 40 MG tablet Take 1 tablet (40 mg total) by mouth 2 (two) times daily. 60 tablet 1  . pantoprazole (PROTONIX) 40 MG tablet TAKE 1 TABLET BY MOUTH EVERY DAY 30 tablet 0  . predniSONE (DELTASONE) 20 MG tablet Take 1 tablet (20 mg total) by mouth daily with breakfast. 6 tablet 0  . topiramate (TOPAMAX) 50 MG tablet Take 100 mg by mouth 2 (two) times daily.  3  . triazolam (HALCION) 0.25 MG tablet  Take 1 tablet (0.25 mg total) by mouth at bedtime as needed for sleep. 30 tablet 0  . zolpidem (AMBIEN) 10 MG tablet Take 10 mg by mouth at bedtime as needed for sleep.     No facility-administered medications prior to visit.     Past Medical History:  Diagnosis Date  . Bipolar 2 disorder (HCC)   . Depression   . Morbid obesity (HCC)     Review of Systems  Constitutional: Negative for chills and fever.  Skin: Positive for rash.      Objective:    BP 138/80 (BP Location: Left Arm, Patient Position: Sitting, Cuff Size: Large)   Pulse 78   Temp 98.6 F (37 C) (Oral)   Resp 16   Ht  (1.676 m)   Wt 286 lb (129.7 kg)   SpO2 96%   BMI 46.16 kg/m  Nursing note and vital signs reviewed.  Physical Exam  Constitutional: She is oriented to person, place, and time. She appears well-developed and well-nourished. No distress.  Cardiovascular: Normal rate, regular rhythm, normal heart sounds and intact distal pulses.   Pulmonary/Chest: Effort normal and breath sounds normal.  Neurological: She is alert and oriented to person, place, and time.  Skin: Skin is warm and dry.  Scaly, dry rash located on her forehead in a non-specific pattern. Color is dark brown with distinct borders.   Psychiatric: She has a normal mood and affect. Her behavior is normal. Judgment and thought content normal.       Assessment & Plan:   Problem List Items Addressed This Visit      Musculoskeletal and Integument   Atopic dermatitis - Primary    Symptoms and exam are consistent with atopic dermatitis uncontrolled with current OTC regimen. Start triamcinolone. Encouraged moisturization with Dove, Aveeno, or Eucerin cream. If symptoms worsen will refer to dermatology. Follow up if symptoms worsen or does not improve.           I am having Ms. Lowrimore start on triamcinolone. I am also having her maintain her zolpidem, buPROPion, cloNIDine, FLUoxetine, topiramate, ondansetron, predniSONE, triazolam,  albuterol, meloxicam, pantoprazole, and pantoprazole.   Meds ordered this encounter  Medications  . triamcinolone (KENALOG) 0.025 % cream    Sig: Apply 1 application topically 2 (two) times daily.    Dispense:  30 g    Refill:  1    Order Specific Question:   Supervising Provider    Answer:   CRAWFORD, Hillard DankerELIZABETH A [[id number]]     Follow-up: Return if symptoms worsen or fail to improve.  Jeanine Luzalone, Shironda Kain, FNP

## 2017-07-10 NOTE — Assessment & Plan Note (Signed)
Symptoms and exam are consistent with atopic dermatitis uncontrolled with current OTC regimen. Start triamcinolone. Encouraged moisturization with Dove, Aveeno, or Eucerin cream. If symptoms worsen will refer to dermatology. Follow up if symptoms worsen or does not improve.

## 2017-07-25 ENCOUNTER — Other Ambulatory Visit: Payer: Self-pay | Admitting: Family

## 2017-08-14 ENCOUNTER — Other Ambulatory Visit: Payer: Self-pay

## 2017-08-14 DIAGNOSIS — K2100 Gastro-esophageal reflux disease with esophagitis, without bleeding: Secondary | ICD-10-CM

## 2017-08-14 DIAGNOSIS — F5104 Psychophysiologic insomnia: Secondary | ICD-10-CM

## 2017-08-15 ENCOUNTER — Encounter: Payer: Self-pay | Admitting: Gastroenterology

## 2017-09-12 ENCOUNTER — Other Ambulatory Visit: Payer: Self-pay | Admitting: Internal Medicine

## 2017-09-12 DIAGNOSIS — G4700 Insomnia, unspecified: Secondary | ICD-10-CM | POA: Diagnosis not present

## 2017-09-12 DIAGNOSIS — Z1231 Encounter for screening mammogram for malignant neoplasm of breast: Secondary | ICD-10-CM

## 2017-09-12 DIAGNOSIS — Z1389 Encounter for screening for other disorder: Secondary | ICD-10-CM | POA: Diagnosis not present

## 2017-09-12 DIAGNOSIS — M1990 Unspecified osteoarthritis, unspecified site: Secondary | ICD-10-CM | POA: Diagnosis not present

## 2017-09-12 DIAGNOSIS — Z23 Encounter for immunization: Secondary | ICD-10-CM | POA: Diagnosis not present

## 2017-09-28 ENCOUNTER — Encounter: Payer: Self-pay | Admitting: Neurology

## 2017-09-28 ENCOUNTER — Ambulatory Visit (INDEPENDENT_AMBULATORY_CARE_PROVIDER_SITE_OTHER): Payer: Medicare Other | Admitting: Neurology

## 2017-09-28 VITALS — BP 134/87 | HR 84 | Ht 66.0 in | Wt 284.0 lb

## 2017-09-28 DIAGNOSIS — G478 Other sleep disorders: Secondary | ICD-10-CM

## 2017-09-28 DIAGNOSIS — G479 Sleep disorder, unspecified: Secondary | ICD-10-CM | POA: Diagnosis not present

## 2017-09-28 DIAGNOSIS — F39 Unspecified mood [affective] disorder: Secondary | ICD-10-CM

## 2017-09-28 DIAGNOSIS — Z6841 Body Mass Index (BMI) 40.0 and over, adult: Secondary | ICD-10-CM | POA: Diagnosis not present

## 2017-09-28 DIAGNOSIS — R351 Nocturia: Secondary | ICD-10-CM | POA: Diagnosis not present

## 2017-09-28 DIAGNOSIS — R0683 Snoring: Secondary | ICD-10-CM

## 2017-09-28 NOTE — Progress Notes (Signed)
Subjective:    Patient ID: Shannon Sharp is a 51 y.o. female.  HPI     Huston FoleySaima Khristian Seals, MD, PhD [facility] [address], [address] [address] [place], [place] [address]  Dear Earl LitesGregory,   I saw your patient, Shannon Sharp, upon your kind request in my neurologic clinic today for initial consultation of her sleep disorder: Particular, concern for underlying obstructive sleep apnea. The patient is unaccompanied today. As you know, Shannon Sharp is a 51 year old right-handed woman with an underlying medical history of eczema, mood disorder, reflux disease, and morbid obesity with a BMI of over 40, who reports chronic difficulty with her sleep, including not waking up rested, sleep disruption, snoring. She lives with her 2 children, 312 year old daughter and 52 year old son. She is on disability. She does not smoke and does not drink alcohol on a regular basis, she drinks caffeine very occasionally, not daily. Her Epworth sleepiness score is 5 out of 24, fatigue score is 62 out of 63 today. I reviewed your office note from 07/10/2017. She denies telltale symptoms of versus leg syndrome. She tries to be in bed around 11. She has been taking Ambien nightly for at least 5 years. She is followed by psychiatry. Wake up time is generally around 6. She has nocturia about 2-3 times per average night. She does not endorse morning headaches. She does not watch TV in bed. Sometimes she has slept in a recliner. She used to have difficulty with nighttime reflux. She is not aware of any family history of OSA. She reports having a sleep study many years ago but did not sleep very much. She has been told that she snores. She has 2 dogs that sleep in the bed with her.  Her Past Medical History Is Significant For: Past Medical History:  Diagnosis Date  . Bipolar 2 disorder (HCC)   . Depression   . Morbid obesity (HCC)     Her Past Surgical History Is Significant For: No past surgical  history on file.  Her Family History Is Significant For: Family History  Problem Relation Age of Onset  . Hypertension Mother   . Hypertension Father     Her Social History Is Significant For: Social History   Socioeconomic History  . Marital status: Single    Spouse name: None  . Number of children: 2  . Years of education: 1812  . Highest education level: None  Social Needs  . Financial resource strain: None  . Food insecurity - worry: None  . Food insecurity - inability: None  . Transportation needs - medical: None  . Transportation needs - non-medical: None  Occupational History  . Occupation: Disability  Tobacco Use  . Smoking status: Never Smoker  . Smokeless tobacco: Never Used  Substance and Sexual Activity  . Alcohol use: No  . Drug use: No  . Sexual activity: Yes    Birth control/protection: IUD  Other Topics Concern  . None  Social History Narrative   Fun/Hobby: Attend classes at the mental [employer]; working on going to the gym.     Her Allergies Are:  Allergies  Allergen Reactions  . Codeine Itching  . Penicillins Itching  . Sulfa Antibiotics Swelling  :   Her Current Medications Are:  Outpatient Encounter Medications as of 09/28/2017  Medication Sig  . albuterol (PROVENTIL HFA;VENTOLIN HFA) 108 (90 Base) MCG/ACT inhaler Inhale 2 puffs into the lungs every 4 (four) hours as needed for wheezing or shortness of  breath.  Marland Kitchen. buPROPion (WELLBUTRIN XL) 150 MG 24 hr tablet Take 150 mg by mouth daily.   Marland Kitchen. FLUoxetine (PROZAC) 20 MG capsule Take 80 mg by mouth daily. Takes 80 mg po  . meloxicam (MOBIC) 15 MG tablet TAKE 1 TABLET BY MOUTH EVERY DAY  . topiramate (TOPAMAX) 50 MG tablet Take 100 mg by mouth 2 (two) times daily.  Marland Kitchen. triamcinolone (KENALOG) 0.025 % cream Apply 1 application topically 2 (two) times daily.  . ziprasidone (GEODON) 60 MG capsule Take 60 mg by mouth at bedtime.  Marland Kitchen. zolpidem (AMBIEN) 10 MG tablet Take 10 mg by mouth at bedtime as  needed for sleep.  . [DISCONTINUED] cloNIDine (CATAPRES) 0.1 MG tablet Take 0.1 mg by mouth at bedtime.   . [DISCONTINUED] ondansetron (ZOFRAN ODT) 4 MG disintegrating tablet Take 1 tablet (4 mg total) by mouth every 8 (eight) hours as needed for nausea or vomiting.  . [DISCONTINUED] pantoprazole (PROTONIX) 40 MG tablet Take 1 tablet (40 mg total) by mouth 2 (two) times daily.  . [DISCONTINUED] pantoprazole (PROTONIX) 40 MG tablet TAKE 1 TABLET BY MOUTH EVERY DAY  . [DISCONTINUED] predniSONE (DELTASONE) 20 MG tablet Take 1 tablet (20 mg total) by mouth daily with breakfast.  . [DISCONTINUED] triazolam (HALCION) 0.25 MG tablet Take 1 tablet (0.25 mg total) by mouth at bedtime as needed for sleep.   No facility-administered encounter medications on file as of 09/28/2017.   :  Review of Systems:  Out of a complete 14 point review of systems, all are reviewed and negative with the exception of these symptoms as listed below: Review of Systems  Neurological:       Pt presents here today with struggling with sleep. Pt states that she has difficulty every night with falling to asleep.  Pt states that if she does sleep she wakes during the nights. She states avg nights sleep would consist of about 4 hours of sleep. Pt states that she is not well rested during the day time. Pt has been told that she snores in her sleep. Pt has had a sleep study before about 20 years ago. She was told that she only slept 75 min out a entire nights stay in the sleep lab.  Epworth Sleepiness Scale 0= would never doze 1= slight chance of dozing 2= moderate chance of dozing 3= high chance of dozing  Sitting and reading:2 Watching TV:3 Sitting inactive in a public place (ex. Theater or meeting):0 As a passenger in a car for an hour without a break:0 Lying down to rest in the afternoon:0 Sitting and talking to someone:0 Sitting quietly after lunch (no alcohol):0 In a car, while stopped in traffic:0 Total:5      Objective:  Neurological Exam  Physical Exam Physical Examination:   Vitals:   09/28/17 1317  BP: 134/87  Pulse: 84   General Examination: The patient is a very pleasant 51 y.o. female in no acute distress. She appears well-developed and well-nourished and adequately groomed.   HEENT: Normocephalic, atraumatic, pupils are equal, round and reactive to light and accommodation. Extraocular tracking is good without limitation to gaze excursion or nystagmus noted. Normal smooth pursuit is noted. Hearing is grossly intact. Face is symmetric with normal facial animation and normal facial sensation. Speech is clear with no dysarthria noted. There is no hypophonia. There is no lip, neck/head, jaw or voice tremor. Neck is supple with full range of passive and active motion. There are no carotid bruits on auscultation. Oropharynx exam reveals:  mild mouth dryness, adequate dental hygiene and moderate airway crowding, due to wider tongue, larger. Uvula, tonsils are small about 1+ bilaterally. Mallampati is class II. Neck circumference is 17 and 7/8 inches. She has a mild overbite. Tongue protrudes centrally and palate elevates symmetrically.   Chest: Clear to auscultation without wheezing, rhonchi or crackles noted.  Heart: S1+S2+0, regular and normal without murmurs, rubs or gallops noted.   Abdomen: Soft, non-tender and non-distended with normal bowel sounds appreciated on auscultation.  Extremities: There is no pitting edema in the distal lower extremities bilaterally. Pedal pulses are intact.  Skin: Warm and dry without trophic changes noted. Dry skin.    Musculoskeletal: exam reveals no obvious joint deformities, tenderness or joint swelling or erythema.   Neurologically:  Mental status: The patient is awake, alert and oriented in all 4 spheres. Her immediate and remote memory, attention, language skills and fund of knowledge are appropriate. There is no evidence of aphasia, agnosia,  apraxia or anomia. Speech is clear with normal prosody and enunciation. Thought process is linear. Mood is normal and affect is normal.  Cranial nerves II - XII are as described above under HEENT exam. In addition: shoulder shrug is normal with equal shoulder height noted. Motor exam: Normal bulk, strength and tone is noted. There is no drift, tremor or rebound. Romberg is negative. Reflexes are 2+ throughout. Fine motor skills and coordination: intact with normal finger taps, normal hand movements, normal rapid alternating patting, normal foot taps and normal foot agility.  Cerebellar testing: No dysmetria or intention tremor. There is no truncal or gait ataxia.  Sensory exam: intact to light touch in the upper and lower extremities.  Gait, station and balance: She stands easily. No veering to one side is noted. No leaning to one side is noted. Posture is age-appropriate and stance is narrow based. Gait shows normal stride length and normal pace. No problems turning are noted. Tandem walk is  difficult for her.  Assessment and Plan:  In summary, Sherin A Stach is a very pleasant 51 y.o.-year old female with an underlying medical history of eczema, mood disorder, reflux disease, and morbid obesity with a BMI of over 40, whose history and physical exam are concerning for obstructive sleep apnea (OSA). I had a long chat with the patient about my findings and the diagnosis of OSA, its prognosis and treatment options. We talked about medical treatments, surgical interventions and non-pharmacological approaches. I explained in particular the risks and ramifications of untreated moderate to severe OSA, especially with respect to developing cardiovascular disease down the Road, including congestive heart failure, difficult to treat hypertension, cardiac arrhythmias, or stroke. Even type 2 diabetes has, in part, been linked to untreated OSA. Symptoms of untreated OSA include daytime sleepiness, memory  problems, mood irritability and mood disorder such as depression and anxiety, lack of energy, as well as recurrent headaches, especially morning headaches. We talked about trying to maintain a healthy lifestyle in general, as well as the importance of weight control. I encouraged the patient to eat healthy, exercise daily and keep well hydrated, to keep a scheduled bedtime and wake time routine, to not skip any meals and eat healthy snacks in between meals. I advised the patient not to drive when feeling sleepy. I recommended the following at this time: sleep study with potential positive airway pressure titration. (We will score hypopneas at 4%).   I explained the sleep test procedure to the patient and also outlined possible surgical and non-surgical treatment  options of OSA, including the use of a custom-made dental device (which would require a referral to a specialist dentist or oral surgeon), upper airway surgical options, such as pillar implants, radiofrequency surgery, tongue base surgery, and UPPP (which would involve a referral to an ENT surgeon). Rarely, jaw surgery such as mandibular advancement may be considered.  I also explained the CPAP treatment option to the patient, who indicated that she would be willing to try CPAP if the need arises. I explained the importance of being compliant with PAP treatment, not only for insurance purposes but primarily to improve Her symptoms, and for the patient's long term health benefit, including to reduce Her cardiovascular risks. I answered all her questions today and the patient was in agreement. I would like to see her back after the sleep study is completed and encouraged her to call with any interim questions, concerns, problems or updates.   Thank you very much for allowing me to participate in the care of this nice patient. If I can be of any further assistance to you please do not hesitate to call me at [phone].  Sincerely,   Huston Foley,  MD, PhD

## 2017-09-28 NOTE — Patient Instructions (Addendum)

## 2017-09-29 ENCOUNTER — Telehealth: Payer: Self-pay

## 2017-10-06 ENCOUNTER — Ambulatory Visit (INDEPENDENT_AMBULATORY_CARE_PROVIDER_SITE_OTHER): Payer: Medicare Other | Admitting: Gastroenterology

## 2017-10-06 ENCOUNTER — Encounter: Payer: Self-pay | Admitting: Gastroenterology

## 2017-10-06 VITALS — BP 140/92 | HR 80 | Ht 65.0 in | Wt 286.4 lb

## 2017-10-06 DIAGNOSIS — Z1211 Encounter for screening for malignant neoplasm of colon: Secondary | ICD-10-CM | POA: Diagnosis not present

## 2017-10-06 DIAGNOSIS — R1310 Dysphagia, unspecified: Secondary | ICD-10-CM | POA: Diagnosis not present

## 2017-10-06 DIAGNOSIS — R1115 Cyclical vomiting syndrome unrelated to migraine: Secondary | ICD-10-CM

## 2017-10-06 DIAGNOSIS — R12 Heartburn: Secondary | ICD-10-CM | POA: Diagnosis not present

## 2017-10-06 DIAGNOSIS — R1110 Vomiting, unspecified: Secondary | ICD-10-CM | POA: Diagnosis not present

## 2017-10-06 DIAGNOSIS — K219 Gastro-esophageal reflux disease without esophagitis: Secondary | ICD-10-CM | POA: Diagnosis not present

## 2017-10-06 MED ORDER — NA SULFATE-K SULFATE-MG SULF 17.5-3.13-1.6 GM/177ML PO SOLN: 1.0000 | Freq: Once | ORAL | 0 refills | Status: AC

## 2017-10-06 MED ORDER — OMEPRAZOLE 40 MG PO CPDR
40.0000 mg | DELAYED_RELEASE_CAPSULE | Freq: Two times a day (BID) | ORAL | 3 refills | Status: DC
Start: 2017-10-06 — End: 2018-02-22

## 2017-10-06 NOTE — Patient Instructions (Addendum)
If you are age 52 or older, your body mass index should be between 23-30. Your Body mass index is 47.66 kg/m. If this is out of the aforementioned range listed, please consider follow up with your Primary Care Provider.  If you are age 52 or younger, your body mass index should be between 19-25. Your Body mass index is 47.66 kg/m. If this is out of the aformentioned range listed, please consider follow up with your Primary Care Provider.   We have sent the following medications to your pharmacy for you to pick up at your convenience:  Suprep  Omeprazole  You have been scheduled for an endoscopy and colonoscopy. Please follow the written instructions given to you at your visit today. Please pick up your prep supplies at the pharmacy within the next 1-3 days. If you use inhalers (even only as needed), please bring them with you on the day of your procedure. Your physician has requested that you go to www.startemmi.com and enter the access code given to you at your visit today. This web site gives a general overview about your procedure. However, you should still follow specific instructions given to you by our office regarding your preparation for the procedure.

## 2017-10-06 NOTE — Progress Notes (Signed)
Shannon Sharp    [id number]    04/27/1966  Primary Care Physician:Husain, Jerelyn ScottKarrar, MD  Referring Physician: Veryl Speakalone, Gregory D, FNP [address] [place], [place] [address]  Chief complaint:  Nausea and vomiting  HPI:  51 year old female with history of bipolar disorder, depression, morbid obesity here for evaluation with  C/o nausea and vomiting She feels that food gets stuck in her throat and she has to bring it up to vomit because cant get it down, when this happens she feels her mouth watering. This happens about 2-3 times a weeks She has heartburn intermittently about 2-3 times a week. Also nausea intermittent.  Regurgitates food 3-4 times a week, without any effort post prandial No melena or BRBPR. No abdominal pain. No diarrhea with occasional constipation No family history of colon cancer She was on Pantoprazole for about 3-4 months ago, didn't notice any difference so stopped about 2 months ago EGD and Colonoscopy about >25 years ago by Dr Vincent PeyerBuchini for diarrhea and vomiting. At that time she lost > 20lbs, was very sick. All test were negative at that time, she was told it was due to stress.     Outpatient Encounter Medications as of 10/06/2017  Medication Sig  . albuterol (PROVENTIL HFA;VENTOLIN HFA) 108 (90 Base) MCG/ACT inhaler Inhale 2 puffs into the lungs every 4 (four) hours as needed for wheezing or shortness of breath.  Marland Kitchen. buPROPion (WELLBUTRIN XL) 150 MG 24 hr tablet Take 150 mg by mouth daily.   Marland Kitchen. FLUoxetine (PROZAC) 20 MG capsule Take 80 mg by mouth daily. Takes 80 mg po  . meloxicam (MOBIC) 15 MG tablet TAKE 1 TABLET BY MOUTH EVERY DAY  . topiramate (TOPAMAX) 50 MG tablet Take 100 mg by mouth 2 (two) times daily.  Marland Kitchen. triamcinolone (KENALOG) 0.025 % cream Apply 1 application topically 2 (two) times daily.  . ziprasidone (GEODON) 60 MG capsule Take 60 mg by mouth at bedtime.  Marland Kitchen. zolpidem (AMBIEN) 10 MG tablet Take 10 mg by mouth at bedtime as  needed for sleep.   No facility-administered encounter medications on file as of 10/06/2017.     Allergies as of 10/06/2017 - Review Complete 09/28/2017  Allergen Reaction Noted  . Codeine Itching 02/22/2012  . Penicillins Itching 02/22/2012  . Sulfa antibiotics Swelling 02/22/2012    Past Medical History:  Diagnosis Date  . Bipolar 2 disorder (HCC)   . Depression   . Morbid obesity (HCC)     No past surgical history on file.  Family History  Problem Relation Age of Onset  . Hypertension Mother   . Hypertension Father     Social History   Socioeconomic History  . Marital status: Single    Spouse name: Not on file  . Number of children: 2  . Years of education: 7312  . Highest education level: Not on file  Social Needs  . Financial resource strain: Not on file  . Food insecurity - worry: Not on file  . Food insecurity - inability: Not on file  . Transportation needs - medical: Not on file  . Transportation needs - non-medical: Not on file  Occupational History  . Occupation: Disability  Tobacco Use  . Smoking status: Never Smoker  . Smokeless tobacco: Never Used  Substance and Sexual Activity  . Alcohol use: No  . Drug use: No  . Sexual activity: Yes    Birth control/protection: IUD  Other Topics Concern  .  Not on file  Social History Narrative   Fun/Hobby: Attend classes at the [employer]; working on going to the gym.       Review of systems: Review of Systems  Constitutional: Negative for fever and chills.  HENT: Negative.   Eyes: Negative for blurred vision.  Respiratory: Negative for cough, shortness of breath and wheezing.   Cardiovascular: Negative for chest pain and palpitations.  Gastrointestinal: as per HPI Genitourinary: Negative for dysuria, urgency, frequency and hematuria.  Musculoskeletal: Positive for myalgias, back pain and joint pain.  Skin: Negative for itching and rash.  Neurological: Negative for dizziness, tremors,  focal weakness, seizures and loss of consciousness.  Endo/Heme/Allergies: Positive for seasonal allergies.  Psychiatric/Behavioral: Negative for depression, suicidal ideas and hallucinations.  All other systems reviewed and are negative.   Physical Exam: Vitals:   10/06/17 1336  BP: (!) 140/92  Pulse: 80   Body mass index is 47.66 kg/m. Gen:      No acute distress HEENT:  EOMI, sclera anicteric Neck:     No masses; no thyromegaly Lungs:    Clear to auscultation bilaterally; normal respiratory effort CV:         Regular rate and rhythm; no murmurs Abd:      + bowel sounds; soft, non-tender; no palpable masses, no distension Ext:    No edema; adequate peripheral perfusion Skin:      Warm and dry; no rash Neuro: alert and oriented x 3 Psych: normal mood and affect  Data Reviewed:  Reviewed labs, radiology imaging, old records and pertinent past GI work up   Assessment and Plan/Recommendations:  [age] yr F with dysphagia, regurgitation, nausea, vomiting and heartburn Patient's presentation is concerning for GERD and possible peptic stricture Will schedule EGD with possible esophageal dilation and biopsies Start Omeprazole 40mg  BID, before breakfast and dinner Discussed antireflux measures and lifestyle modifications Avoid tough meat, bread and raw vegetable, stay on soft diet until EGD to prevent food impaction  Due for colorectal cancer screening, will schedule for colonoscopy along with EGD  The risks and benefits as well as alternatives of endoscopic procedure(s) have been discussed and reviewed. All questions answered. The patient agrees to proceed.    Iona BeardK. Veena Tylisha Danis , MD [phone] Mon-Fri 8a-5p [phone] after 5p, weekends, holidays  CC: Veryl Speakalone, Gregory D, FNP

## 2017-10-12 ENCOUNTER — Ambulatory Visit: Payer: Medicare Other

## 2017-10-31 DIAGNOSIS — K648 Other hemorrhoids: Secondary | ICD-10-CM

## 2017-10-31 HISTORY — DX: Other hemorrhoids: K64.8

## 2017-11-01 ENCOUNTER — Other Ambulatory Visit: Payer: Self-pay | Admitting: Family

## 2017-11-08 ENCOUNTER — Ambulatory Visit (AMBULATORY_SURGERY_CENTER): Payer: Medicare Other | Admitting: Gastroenterology

## 2017-11-08 ENCOUNTER — Encounter: Payer: Self-pay | Admitting: Gastroenterology

## 2017-11-08 ENCOUNTER — Other Ambulatory Visit: Payer: Self-pay

## 2017-11-08 VITALS — BP 148/88 | HR 76 | Temp 97.7°F | Resp 11 | Ht 65.0 in | Wt 286.0 lb

## 2017-11-08 DIAGNOSIS — R1310 Dysphagia, unspecified: Secondary | ICD-10-CM | POA: Diagnosis present

## 2017-11-08 DIAGNOSIS — Z1211 Encounter for screening for malignant neoplasm of colon: Secondary | ICD-10-CM | POA: Diagnosis not present

## 2017-11-08 HISTORY — PX: ESOPHAGOGASTRODUODENOSCOPY ENDOSCOPY: SHX5814

## 2017-11-08 HISTORY — PX: COLONOSCOPY: SHX174

## 2017-11-08 MED ORDER — SODIUM CHLORIDE 0.9 % IV SOLN: 500.0000 mL | INTRAVENOUS | Status: DC

## 2017-11-08 NOTE — Progress Notes (Signed)
A and O x3. Report to RN. Tolerated MAC anesthesia well.Teeth unchanged after procedure.

## 2017-11-08 NOTE — Progress Notes (Signed)
Called to room to assist during endoscopic procedure.  Patient ID and intended procedure confirmed with present staff. Received instructions for my participation in the procedure from the performing physician.  

## 2017-11-08 NOTE — Op Note (Signed)
[facility] Patient Name: Shannon Sharp Procedure Date: 11/08/2017 3:19 PM MRN: [id number] Endoscopist: Shannon Sharp , MD Age: 52 Referring MD:  Date of Birth: Aug 19, 1966 Gender: Female Account #: [id number] Procedure:                Upper GI endoscopy Indications:              Dysphagia Medicines:                Monitored Anesthesia Care Procedure:                Pre-Anesthesia Assessment:                           - Prior to the procedure, a History and Physical                            was performed, and patient medications and                            allergies were reviewed. The patient's tolerance of                            previous anesthesia was also reviewed. The risks                            and benefits of the procedure and the sedation                            options and risks were discussed with the patient.                            All questions were answered, and informed consent                            was obtained. Prior Anticoagulants: The patient has                            taken no previous anticoagulant or antiplatelet                            agents. ASA Grade Assessment: II - A patient with                            mild systemic disease. After reviewing the risks                            and benefits, the patient was deemed in                            satisfactory condition to undergo the procedure.                           After obtaining informed consent, the endoscope was  passed under direct vision. Throughout the                            procedure, the patient's blood pressure, pulse, and                            oxygen saturations were monitored continuously. The                            Endoscope was introduced through the mouth, and                            advanced to the second part of duodenum. The upper                            GI endoscopy was accomplished without  difficulty.                            The patient tolerated the procedure well. Scope In: Scope Out: Findings:                 No endoscopic abnormality was evident in the                            esophagus to explain the patient's complaint of                            dysphagia. Biopsies were obtained from the proximal                            and distal esophagus with cold forceps for                            histology of suspected eosinophilic esophagitis.                           The stomach was normal.                           The examined duodenum was normal. Complications:            No immediate complications. Estimated Blood Loss:     Estimated blood loss was minimal. Impression:               - No endoscopic esophageal abnormality to explain                            patient's dysphagia. Biopsied.                           - Normal stomach.                           - Normal examined duodenum. Recommendation:           - Patient has a contact number available for  emergencies. The signs and symptoms of potential                            delayed complications were discussed with the                            patient. Return to normal activities tomorrow.                            Written discharge instructions were provided to the                            patient.                           - Resume previous diet.                           - Continue present medications.                           - Await pathology results. Shannon FormKavitha V. Kalkidan Caudell, MD 11/08/2017 3:59:48 PM This report has been signed electronically.

## 2017-11-08 NOTE — Patient Instructions (Addendum)
YOU HAD AN ENDOSCOPIC PROCEDURE TODAY AT THE [facility]:   Refer to the procedure report that was given to you for any specific questions about what was found during the examination.  If the procedure report does not answer your questions, please call your gastroenterologist to clarify.  If you requested that your care partner not be given the details of your procedure findings, then the procedure report has been included in a sealed envelope for you to review at your convenience later.  YOU SHOULD EXPECT: Some feelings of bloating in the abdomen. Passage of more gas than usual.  Walking can help get rid of the air that was put into your GI tract during the procedure and reduce the bloating. If you had a lower endoscopy (such as a colonoscopy or flexible sigmoidoscopy) you may notice spotting of blood in your stool or on the toilet paper. If you underwent a bowel prep for your procedure, you may not have a normal bowel movement for a few days.  Please Note:  You might notice some irritation and congestion in your nose or some drainage.  This is from the oxygen used during your procedure.  There is no need for concern and it should clear up in a day or so.  SYMPTOMS TO REPORT IMMEDIATELY:   Following lower endoscopy (colonoscopy or flexible sigmoidoscopy):  Excessive amounts of blood in the stool  Significant tenderness or worsening of abdominal pains  Swelling of the abdomen that is new, acute  Fever of 100F or higher   Following upper endoscopy (EGD)  Vomiting of blood or coffee ground material  New chest pain or pain under the shoulder blades  Painful or persistently difficult swallowing  New shortness of breath  Fever of 100F or higher  Black, tarry-looking stools  For urgent or emergent issues, a gastroenterologist can be reached at any hour by calling [phone].   DIET:  We do recommend a small meal at first, but then you may proceed to your regular diet.  Drink  plenty of fluids but you should avoid alcoholic beverages for 24 hours.  ACTIVITY:  You should plan to take it easy for the rest of today and you should NOT DRIVE or use heavy machinery until tomorrow (because of the sedation medicines used during the test).    FOLLOW UP: Our staff will call the number listed on your records the next business day following your procedure to check on you and address any questions or concerns that you may have regarding the information given to you following your procedure. If we do not reach you, we will leave a message.  However, if you are feeling well and you are not experiencing any problems, there is no need to return our call.  We will assume that you have returned to your regular daily activities without incident.  If any biopsies were taken you will be contacted by phone or by letter within the next 1-3 weeks.  Please call us at [phone] if you have not heard about the biopsies in 3 weeks.   Await for biopsy results  Next repeat Colonoscopy screening in 10 years Office visit with Dr. Lavon PaganiniNandigam for Feb. 25, 2019 at 1000  SIGNATURES/CONFIDENTIALITY: You and/or your care partner have signed paperwork which will be entered into your electronic medical record.  These signatures attest to the fact that that the information above on your After Visit Summary has been reviewed and is understood.  Full responsibility of the  confidentiality of this discharge information lies with you and/or your care-partner. 

## 2017-11-08 NOTE — Op Note (Signed)
[facility] Patient Name: Shannon Sharp Procedure Date: 11/08/2017 3:19 PM MRN: [id number] Endoscopist: Napoleon Form , MD Age: 52 Referring MD:  Date of Birth: [birth year] Gender: Female Account #: [id number] Procedure:                Colonoscopy Indications:              Screening for colorectal malignant neoplasm Medicines:                Monitored Anesthesia Care Procedure:                Pre-Anesthesia Assessment:                           - Prior to the procedure, a History and Physical                            was performed, and patient medications and                            allergies were reviewed. The patient's tolerance of                            previous anesthesia was also reviewed. The risks                            and benefits of the procedure and the sedation                            options and risks were discussed with the patient.                            All questions were answered, and informed consent                            was obtained. Prior Anticoagulants: The patient has                            taken no previous anticoagulant or antiplatelet                            agents. ASA Grade Assessment: II - A patient with                            mild systemic disease. After reviewing the risks                            and benefits, the patient was deemed in                            satisfactory condition to undergo the procedure.                           After obtaining informed consent, the colonoscope  was passed under direct vision. Throughout the                            procedure, the patient's blood pressure, pulse, and                            oxygen saturations were monitored continuously. The                            Colonoscope was introduced through the anus and                            advanced to the the cecum, identified by                            appendiceal orifice  and ileocecal valve. The                            colonoscopy was performed without difficulty. The                            patient tolerated the procedure well. The quality                            of the bowel preparation was good. The ileocecal                            valve, appendiceal orifice, and rectum were                            photographed. Scope In: 3:40:20 PM Scope Out: 3:53:25 PM Scope Withdrawal Time: 0 hours 8 minutes 2 seconds  Total Procedure Duration: 0 hours 13 minutes 5 seconds  Findings:                 The perianal and digital rectal examinations were                            normal.                           Non-bleeding internal hemorrhoids were found during                            retroflexion. The hemorrhoids were small.                           The exam was otherwise without abnormality. Complications:            No immediate complications. Estimated Blood Loss:     Estimated blood loss: none. Estimated blood loss:                            none. Impression:               - Non-bleeding internal hemorrhoids.                           -  The examination was otherwise normal.                           - No specimens collected. Recommendation:           - Patient has a contact number available for                            emergencies. The signs and symptoms of potential                            delayed complications were discussed with the                            patient. Return to normal activities tomorrow.                            Written discharge instructions were provided to the                            patient.                           - Resume previous diet.                           - Continue present medications.                           - Repeat colonoscopy in 10 years for screening                            purposes. Napoleon Form, MD 11/08/2017 4:07:09 PM This report has been signed electronically.

## 2017-11-09 ENCOUNTER — Telehealth: Payer: Self-pay

## 2017-11-09 NOTE — Telephone Encounter (Signed)
  Follow up Call-  Call back number 11/08/2017  Post procedure Call Back phone  # [phone]  Permission to leave phone message Yes  Some recent data might be hidden     Patient questions:  Do you have a fever, pain , or abdominal swelling? No. Pain Score  0 *  Have you tolerated food without any problems? Yes.    Have you been able to return to your normal activities? Yes.    Do you have any questions about your discharge instructions: Diet   No. Medications  No. Follow up visit  No.  Do you have questions or concerns about your Care? No.  Actions: * If pain score is 4 or above: No action needed, pain <4.

## 2017-11-10 NOTE — Addendum Note (Signed)
Addended by: Inocente SallesWESTBROOK, Cait Locust B on: 11/10/2017 07:47 AM   Modules accepted: Orders

## 2017-11-15 ENCOUNTER — Encounter: Payer: Self-pay | Admitting: Gastroenterology

## 2017-11-16 ENCOUNTER — Ambulatory Visit (INDEPENDENT_AMBULATORY_CARE_PROVIDER_SITE_OTHER): Payer: Medicare Other | Admitting: Neurology

## 2017-11-16 DIAGNOSIS — F39 Unspecified mood [affective] disorder: Secondary | ICD-10-CM

## 2017-11-16 DIAGNOSIS — R0683 Snoring: Secondary | ICD-10-CM

## 2017-11-16 DIAGNOSIS — G4720 Circadian rhythm sleep disorder, unspecified type: Secondary | ICD-10-CM

## 2017-11-16 DIAGNOSIS — Z6841 Body Mass Index (BMI) 40.0 and over, adult: Secondary | ICD-10-CM

## 2017-11-16 DIAGNOSIS — R351 Nocturia: Secondary | ICD-10-CM

## 2017-11-16 DIAGNOSIS — G479 Sleep disorder, unspecified: Secondary | ICD-10-CM

## 2017-11-16 DIAGNOSIS — G4733 Obstructive sleep apnea (adult) (pediatric): Secondary | ICD-10-CM

## 2017-11-16 DIAGNOSIS — G478 Other sleep disorders: Secondary | ICD-10-CM

## 2017-11-22 ENCOUNTER — Other Ambulatory Visit: Payer: Self-pay | Admitting: Neurology

## 2017-11-22 DIAGNOSIS — F39 Unspecified mood [affective] disorder: Secondary | ICD-10-CM

## 2017-11-22 DIAGNOSIS — Z6841 Body Mass Index (BMI) 40.0 and over, adult: Secondary | ICD-10-CM

## 2017-11-22 DIAGNOSIS — R351 Nocturia: Secondary | ICD-10-CM

## 2017-11-22 DIAGNOSIS — G4733 Obstructive sleep apnea (adult) (pediatric): Secondary | ICD-10-CM

## 2017-11-22 DIAGNOSIS — G4720 Circadian rhythm sleep disorder, unspecified type: Secondary | ICD-10-CM

## 2017-11-22 NOTE — Progress Notes (Signed)
Patient referred by Marcos EkeGreg Calone, NP, seen by me on 09/28/17, diagnostic PSG on 11/16/17.    Please call and notify the patient that the recent sleep study did confirm the diagnosis of moderate to severe obstructive sleep apnea, with a total AHI of 18.4/hour, REM AHI of 54.5/hour, supine AHI of 18.4/hour and O2 nadir of 72%. I recommend treatment for this in the form of CPAP. This will require a repeat sleep study for proper titration and mask fitting. Please explain to patient and arrange for a CPAP titration study. I have placed an order in the chart. Thanks.  Huston FoleySaima Pinkney Venard, MD, PhD [facility])

## 2017-11-22 NOTE — Procedures (Signed)
PATIENT'S NAME:  Shannon Sharp, Shannon Sharp DOB:      Apr 29, 1966      MR#:    [id number]     DATE OF RECORDING: 11/16/2017 REFERRING M.D.:  Jeanine Luz, FNP Study Performed:   Baseline Polysomnogram HISTORY: 52 year old woman with a history of eczema, mood disorder, reflux disease, and morbid obesity, who reports chronic difficulty with her sleep, including not waking up rested, sleep disruption, snoring. The patient endorsed the Epworth Sleepiness Scale at 5 points. The patient's weight 284 pounds with a height of 66 (inches), resulting in a BMI of 45.7 kg/m2. The patient's neck circumference measured 18 inches.  CURRENT MEDICATIONS: Proventil HFA, Wellbutrin, Prozac, Mobic, Topamax, Kenalog, Geodon, Ambien   PROCEDURE:  This is a multichannel digital polysomnogram utilizing the Somnostar 11.2 system.  Electrodes and sensors were applied and monitored per AASM Specifications.   EEG, EOG, Chin and Limb EMG, were sampled at 200 Hz.  ECG, Snore and Nasal Pressure, Thermal Airflow, Respiratory Effort, CPAP Flow and Pressure, Oximetry was sampled at 50 Hz. Digital video and audio were recorded.      BASELINE STUDY  Lights Out was at 21:56 and Lights On at 05:00.  Total recording time (TRT) was 424 minutes, with a total sleep time (TST) of  375.5 minutes.   The patient's sleep latency was 3 minutes, REM latency was 353.5 minutes, which is markedly delayed. The sleep efficiency was 88.6 %.     SLEEP ARCHITECTURE: WASO (Wake after sleep onset) was 45 minutes with moderate sleep fragmentation noted in the later part of the study.  There were 17.5 minutes in Stage N1, 341.5 minutes Stage N2, 0 minutes Stage N3 and 16.5 minutes in Stage REM.  The percentage of Stage N1 was 4.7%, Stage N2 was 90.9%, which is markedly increased, Stage N3 was absent, and Stage R (REM sleep) was 4.4%, which is markedly decreased. The arousals were noted as: 11 were spontaneous, 0 were associated with PLMs, 77 were associated with  respiratory events.   Audio and video analysis did not show any abnormal or unusual movements, behaviors, phonations or vocalizations. The patient took no bathroom breaks. Mild to moderate snoring was noted. The EKG was in keeping with normal sinus rhythm (NSR).  RESPIRATORY ANALYSIS:  There were a total of 115 respiratory events:  36 obstructive apneas, 0 central apneas and 0 mixed apneas with a total of 36 apneas and an apnea index (AI) of 5.8 /hour. There were 79 hypopneas with a hypopnea index of 12.6 /hour. The patient also had 0 respiratory event related arousals (RERAs).      The total APNEA/HYPOPNEA INDEX (AHI) was 18.4/hour and the total RESPIRATORY DISTURBANCE INDEX was 18.4 /hour.  15 events occurred in REM sleep and 158 events in NREM. The REM AHI was 54.5 /hour, versus a non-REM AHI of 16.7. The patient spent 375.5 minutes of total sleep time in the supine position and 0 minutes in non-supine.. The supine AHI was 18.4 versus a non-supine AHI of 0.0.  OXYGEN SATURATION & C02:  The Wake baseline 02 saturation was 92%, with the lowest being 72%. Time spent below 89% saturation equaled 56 minutes.  PERIODIC LIMB MOVEMENTS: The patient had a total of 0 Periodic Limb Movements.  The Periodic Limb Movement (PLM) index was 0 and the PLM Arousal index was 0/hour.  Post-study, the patient indicated that sleep was better than usual.   IMPRESSION:  1. Obstructive Sleep Apnea (OSA) 2. Dysfunctions associated with sleep stages or arousal from  sleep  RECOMMENDATIONS:  1. This study demonstrates moderate to severe obstructive sleep apnea, with a total AHI of 18.4/hour, REM AHI of 54.5/hour, supine AHI of 18.4/hour and O2 nadir of 72%. Treatment with positive airway pressure in the form of CPAP is recommended. This will require a full night titration study to optimize therapy. Other treatment options may include avoidance of supine sleep position along with weight loss, upper airway or jaw surgery  in selected patients or the use of an oral appliance in certain patients. ENT evaluation and/or consultation with a maxillofacial surgeon or dentist may be feasible in some instances.    2. Please note that untreated obstructive sleep apnea carries additional perioperative morbidity. Patients with significant obstructive sleep apnea should receive perioperative PAP therapy and the surgeons and particularly the anesthesiologist should be informed of the diagnosis and the severity of the sleep disordered breathing. 3. This study shows sleep fragmentation and abnormal sleep stage percentages; these are nonspecific findings and per se do not signify an intrinsic sleep disorder or a cause for the patient's sleep-related symptoms. Causes include (but are not limited to) the first night effect of the sleep study, circadian rhythm disturbances, medication effect or an underlying mood disorder or medical problem.  4. The patient should be cautioned not to drive, work at heights, or operate dangerous or heavy equipment when tired or sleepy. Review and reiteration of good sleep hygiene measures should be pursued with any patient. 5. The patient will be [facility] discussion of the test results and further management strategies. The referring provider will be notified of the test results.  I certify that I have reviewed the entire raw data recording prior to the issuance of this report in accordance with the Standards of Accreditation of the American Academy of Sleep Medicine (AASM)   Huston FoleySaima Creed Kail, MD, PhD Diplomat, American Board of Psychiatry and Neurology (Neurology and Sleep Medicine)

## 2017-11-23 ENCOUNTER — Telehealth: Payer: Self-pay

## 2017-11-23 NOTE — Telephone Encounter (Signed)
-----   Message from Huston FoleySaima Athar, MD sent at 11/22/2017  7:49 AM EST ----- Patient referred by Marcos EkeGreg Calone, NP, seen by me on 09/28/17, diagnostic PSG on 11/16/17.    Please call and notify the patient that the recent sleep study did confirm the diagnosis of moderate to severe obstructive sleep apnea, with a total AHI of 18.4/hour, REM AHI of 54.5/hour, supine AHI of 18.4/hour and O2 nadir of 72%. I recommend treatment for this in the form of CPAP. This will require a repeat sleep study for proper titration and mask fitting. Please explain to patient and arrange for a CPAP titration study. I have placed an order in the chart. Thanks.  Huston FoleySaima Athar, MD, PhD [facility]

## 2017-11-23 NOTE — Telephone Encounter (Signed)

## 2017-12-07 ENCOUNTER — Ambulatory Visit (INDEPENDENT_AMBULATORY_CARE_PROVIDER_SITE_OTHER): Payer: Medicare Other | Admitting: Neurology

## 2017-12-07 DIAGNOSIS — G4733 Obstructive sleep apnea (adult) (pediatric): Secondary | ICD-10-CM | POA: Diagnosis not present

## 2017-12-07 DIAGNOSIS — Z6841 Body Mass Index (BMI) 40.0 and over, adult: Secondary | ICD-10-CM

## 2017-12-07 DIAGNOSIS — F39 Unspecified mood [affective] disorder: Secondary | ICD-10-CM

## 2017-12-07 DIAGNOSIS — G4720 Circadian rhythm sleep disorder, unspecified type: Secondary | ICD-10-CM

## 2017-12-07 DIAGNOSIS — R351 Nocturia: Secondary | ICD-10-CM

## 2017-12-08 ENCOUNTER — Other Ambulatory Visit: Payer: Self-pay | Admitting: Neurology

## 2017-12-08 ENCOUNTER — Telehealth: Payer: Self-pay

## 2017-12-08 DIAGNOSIS — G4733 Obstructive sleep apnea (adult) (pediatric): Secondary | ICD-10-CM

## 2017-12-08 NOTE — Progress Notes (Signed)
Patient referred by Marcos EkeGreg Calone, NP, seen by me on 09/28/17, diagnostic PSG on 11/16/17. Patient had a CPAP titration study on 12/07/17.  Please call and inform patient that I have entered an order for treatment with positive airway pressure (PAP) treatment for obstructive sleep apnea (OSA). She did well during the latest sleep study with CPAP. We will, therefore, arrange for a machine for home use through a DME (durable medical equipment) company of Her choice; and I will see the patient back in follow-up in about 10 weeks. Please also explain to the patient that I will be looking out for compliance data, which can be downloaded from the machine (stored on an SD card, that is inserted in the machine) or via remote access through a modem, that is built into the machine. At the time of the followup appointment we will discuss sleep study results and how it is going with PAP treatment at home. Please advise patient to bring Her machine at the time of the first FU visit, even though this is cumbersome. Bringing the machine for every visit after that will likely not be needed, but often helps for the first visit to troubleshoot if needed. Please re-enforce the importance of compliance with treatment and the need for us to monitor compliance data - often an insurance requirement and actually good feedback for the patient as far as how they are doing.  Also remind patient, that any interim PAP machine or mask issues should be first addressed with the DME company, as they can often help better with technical and mask fit issues. Please ask if patient has a preference regarding DME company.  Please also make sure, the patient has a follow-up appointment with me in about 10 weeks from the setup date, thanks. May see one of our nurse practitioners if needed for proper timing of the FU appointment.  Please fax or rout report to the referring provider. Thanks,   Huston FoleySaima Adelfo Diebel, MD, PhD [facility]

## 2017-12-08 NOTE — Procedures (Signed)
PATIENT'S NAME:  Shannon Sharp, Countess DOB:      11/19/1965      MR#:    [id number]     DATE OF RECORDING: 12/07/2017 REFERRING M.D.:  Leory PlowmanGregory C alone FNP Study Performed:   CPAP  Titration HISTORY: 52 year old woman with a history of eczema, mood disorder, reflux disease, and morbid obesity with a BMI of over 40, who returns for a CPAP titration. Her baseline sleep study from 11/16/17 showed moderate to severe obstructive sleep apnea, with a total AHI of 18.4/hour, REM AHI of 54.5/hour, supine AHI of 18.4/hour and O2 nadir of 72%. BMI of 45.7 kg/m2. The patient's neck circumference measured 18 inches.  CURRENT MEDICATIONS: Proventil HFA, Wellbutrin, Prozac, Mobic, Topamax, Kenalog, Geodon, Ambien  PROCEDURE:  This is a multichannel digital polysomnogram utilizing the SomnoStar 11.2 system.  Electrodes and sensors were applied and monitored per AASM Specifications.   EEG, EOG, Chin and Limb EMG, were sampled at 200 Hz.  ECG, Snore and Nasal Pressure, Thermal Airflow, Respiratory Effort, CPAP Flow and Pressure, Oximetry was sampled at 50 Hz. Digital video and audio were recorded.      The patient was fitted with a small Eson nasal mask. CPAP was initiated at 5 cmH20 with heated humidity per AASM split night standards and pressure was advanced to 8 cmH20 because of hypopneas, apneas and desaturations.  At a PAP pressure of 0 cmH20, there was a reduction of the AHI to 0 with supine REM sleep achieved and O2 nadir of 96%.   Lights Out was at 00:04 and Lights On at 08:03. Total recording time (TRT) was 480 minutes, with a total sleep time (TST) of 426.5 minutes. The patient's sleep latency was 2 minutes. REM latency was 220 minutes, which is delayed. The sleep efficiency was 88.9 %.    SLEEP ARCHITECTURE: WASO (Wake after sleep onset)  was 50.5 minutes with mild sleep fragmentation noted. There were 21.5 minutes in Stage N1, 317.5 minutes Stage N2, 0 minutes Stage N3 and 87.5 minutes in Stage REM.  The  percentage of Stage N1 was 5.%, Stage N2 was 74.4%, which is increased, Stage N3 was absent, Stage R (REM sleep) was 20.5%, which is normal. The arousals were noted as: 31 were spontaneous, 0 were associated with PLMs, 1 were associated with respiratory events.  Audio and video analysis did not show any abnormal or unusual movements, behaviors, phonations or vocalizations. The patient took 1 bathroom break. The EKG was in keeping with normal sinus rhythm (NSR).  RESPIRATORY ANALYSIS:  There was a total of 1 respiratory events: 0 obstructive apneas, 0 central apneas and 0 mixed apneas with a total of 0 apneas and an apnea index (AI) of 0 /hour. There were 1 hypopneas with a hypopnea index of .1/hour. The patient also had 0 respiratory event related arousals (RERAs).      The total APNEA/HYPOPNEA INDEX  (AHI) was .1 /hour and the total RESPIRATORY DISTURBANCE INDEX was .1 .hour  0 events occurred in REM sleep and 1 events in NREM. The REM AHI was 0 /hour versus a non-REM AHI of .2 /hour.  The patient spent 426.5 minutes of total sleep time in the supine position and 0 minutes in non-supine. The supine AHI was 0.1, versus a non-supine AHI of 0.0.  OXYGEN SATURATION & C02:  The baseline 02 saturation was 97%, with the lowest being 91%. Time spent below 89% saturation equaled 0 minutes.  PERIODIC LIMB MOVEMENTS: The patient had a total of  0 Periodic Limb Movements. The Periodic Limb Movement (PLM) index was 0 and the PLM Arousal index was 0 /hour.  Post-study, the patient indicated that sleep was better than usual.   IMPRESSION:   1. Obstructive Sleep Apnea (OSA) 2. Dysfunctions associated with sleep stages or arousal from sleep   RECOMMENDATIONS:   1. This study demonstrates resolution of the patient's obstructive sleep apnea with CPAP therapy. I will, therefore, start the patient on home CPAP treatment at a pressure of 8 cm via small nasal mask with heated humidity. The patient should be reminded  to be fully compliant with PAP therapy to improve sleep related symptoms and decrease long term cardiovascular risks. The patient should be reminded, that it may take up to 3 months to get fully used to using PAP with all planned sleep. The earlier full compliance is achieved, the better long term compliance tends to be. Please note that untreated obstructive sleep apnea carries additional perioperative morbidity. Patients with significant obstructive sleep apnea should receive perioperative PAP therapy and the surgeons and particularly the anesthesiologist should be informed of the diagnosis and the severity of the sleep disordered breathing. 2. This study shows sleep fragmentation and abnormal sleep stage percentages; these are nonspecific findings and per se do not signify an intrinsic sleep disorder or a cause for the patient's sleep-related symptoms. Causes include (but are not limited to) the first night effect of the sleep study, circadian rhythm disturbances, medication effect or an underlying mood disorder or medical problem.  3. The patient should be cautioned not to drive, work at heights, or operate dangerous or heavy equipment when tired or sleepy. Review and reiteration of good sleep hygiene measures should be pursued with any patient. 4. The patient will be seen in follow-up by Dr. Frances Furbish at [facility] for discussion of the test results and further management strategies. The referring provider will be notified of the test results.   I certify that I have reviewed the entire raw data recording prior to the issuance of this report in accordance with the Standards of Accreditation of the American Academy of Sleep Medicine (AASM)     Huston Foley, MD, PhD Diplomat, American Board of Psychiatry and Neurology (Neurology and Sleep Medicine)

## 2017-12-08 NOTE — Telephone Encounter (Signed)
-----   Message from Huston FoleySaima Athar, MD sent at 12/08/2017 10:15 AM EST ----- Patient referred by Marcos EkeGreg Calone, NP, seen by me on 09/28/17, diagnostic PSG on 11/16/17. Patient had a CPAP titration study on 12/07/17.  Please call and inform patient that I have entered an order for treatment with positive airway pressure (PAP) treatment for obstructive sleep apnea (OSA). She did well during the latest sleep study with CPAP. We will, therefore, arrange for a machine for home use through a DME (durable medical equipment) company of Her choice; and I will see the patient back in follow-up in about 10 weeks. Please also explain to the patient that I will be looking out for compliance data, which can be downloaded from the machine (stored on an SD card, that is inserted in the machine) or via remote access through a modem, that is built into the machine. At the time of the followup appointment we will discuss sleep study results and how it is going with PAP treatment at home. Please advise patient to bring Her machine at the time of the first FU visit, even though this is cumbersome. Bringing the machine for every visit after that will likely not be needed, but often helps for the first visit to troubleshoot if needed. Please re-enforce the importance of compliance with treatment and the need for us to monitor compliance data - often an insurance requirement and actually good feedback for the patient as far as how they are doing.  Also remind patient, that any interim PAP machine or mask issues should be first addressed with the DME company, as they can often help better with technical and mask fit issues. Please ask if patient has a preference regarding DME company.  Please also make sure, the patient has a follow-up appointment with me in about 10 weeks from the setup date, thanks. May see one of our nurse practitioners if needed for proper timing of the FU appointment.  Please fax or rout report to the referring provider.  Thanks,   Huston FoleySaima Athar, MD, PhD [facility])

## 2017-12-08 NOTE — Telephone Encounter (Signed)
I called pt. I advised pt that Dr. Frances FurbishAthar reviewed their sleep study results and found that pt did well with the cpap during her latest sleep study. Dr. Frances FurbishAthar recommends that pt start a cpap at home. I reviewed PAP compliance expectations with the pt. Pt is agreeable to starting a CPAP. I advised pt that an order will be sent to a DME, [employer], and [employer] will call the pt within about one week after they file with the pt's insurance. [employer] will show the pt how to use the machine, fit for masks, and troubleshoot the CPAP if needed. A follow up appt was made for insurance purposes with Dr. Frances FurbishAthar on 02/22/18 at 10:30am. Pt verbalized understanding to arrive 15 minutes early and bring their CPAP. A letter with all of this information in it will be mailed to the pt as a reminder. I verified with the pt that the address we have on file is correct. Pt verbalized understanding of results. Pt had no questions at this time but was encouraged to call back if questions arise.

## 2017-12-14 ENCOUNTER — Other Ambulatory Visit: Payer: Self-pay

## 2017-12-14 ENCOUNTER — Telehealth: Payer: Self-pay

## 2017-12-14 MED ORDER — ONDANSETRON HCL 4 MG PO TABS: 4.0000 mg | ORAL_TABLET | Freq: Three times a day (TID) | ORAL | 0 refills | Status: DC | PRN

## 2017-12-14 NOTE — Telephone Encounter (Signed)
Spoke with the patient. She does not have any pain. Discussed bland foods and low fat diet. Follow up appointment is scheduled. She will call if she acutely worsens or go to the ER.

## 2017-12-14 NOTE — Telephone Encounter (Signed)
Has had vomiting for three days. States she last vomited this morning. She ate eggs and bacon. Complains that it comes back up and she has the "burning and acid." She confirms she takes Omeprazole 40 mg.  Please advise.

## 2017-12-14 NOTE — Telephone Encounter (Signed)
Please advise patient to avoid high fat diet. She does have gallstones but no cholecystitis based on last ultrasound. If symptoms worse will consider HIDA scan and possible cholecystectomy. Continue Omeprazole and please send Rx for Zofran 4mg  daily PRN X 30 tabs

## 2017-12-25 ENCOUNTER — Ambulatory Visit (INDEPENDENT_AMBULATORY_CARE_PROVIDER_SITE_OTHER): Payer: Medicare Other | Admitting: Gastroenterology

## 2017-12-25 ENCOUNTER — Encounter: Payer: Self-pay | Admitting: Gastroenterology

## 2017-12-25 VITALS — BP 128/82 | HR 72 | Ht 65.0 in | Wt 282.4 lb

## 2017-12-25 DIAGNOSIS — R1310 Dysphagia, unspecified: Secondary | ICD-10-CM | POA: Diagnosis not present

## 2017-12-25 DIAGNOSIS — R1110 Vomiting, unspecified: Secondary | ICD-10-CM | POA: Diagnosis not present

## 2017-12-25 NOTE — Progress Notes (Signed)
Shannon Sharp    [id number]    06/08/1966  Primary Care Physician:Husain, Jerelyn Scott, MD  Referring Physician: Georgann Housekeeper, MD [address] [place], [place] [address]  Chief complaint: Dysphagia, regurgitation  HPI:  52 year old female with history of bipolar disorder, depression, morbid obesity here for follow-up visit.  Patient continues to have significant regurgitation and intermittent dysphagia.  She feels food is going down better but continues to choke with liquids and regurgitates.  Denies any odynophagia, heartburn or abdominal pain.  No vomiting.  EGD November 08, 2017 with esophageal biopsies was unremarkable, negative for esophageal stricture or eosinophilic esophagitis. She took omeprazole for a few weeks but did not notice any improvement so discontinued it about 2 or 3 weeks ago Her bowel habits are regular with no constipation or diarrhea.  Denies any melena or blood per rectum. Colonoscopy November 08, 2017 showed small internal hemorrhoids otherwise unremarkable exam  Outpatient Encounter Medications as of 12/25/2017  Medication Sig  . albuterol (PROVENTIL HFA;VENTOLIN HFA) 108 (90 Base) MCG/ACT inhaler Inhale 2 puffs into the lungs every 4 (four) hours as needed for wheezing or shortness of breath.  Marland Kitchen atomoxetine (STRATTERA) 80 MG capsule Take 80 mg by mouth daily.   Marland Kitchen buPROPion (WELLBUTRIN XL) 150 MG 24 hr tablet Take 150 mg by mouth daily.   . clonazePAM (KLONOPIN) 1 MG tablet Take 1 tablet by mouth daily.  Marland Kitchen FLUoxetine (PROZAC) 40 MG capsule Take 80 mg by mouth daily.  . hydrocortisone 2.5 % cream Apply 1 application topically as needed.  Marland Kitchen losartan (COZAAR) 50 MG tablet Take 1 tablet by mouth daily.  . meloxicam (MOBIC) 15 MG tablet TAKE 1 TABLET BY MOUTH EVERY DAY  . omeprazole (PRILOSEC) 40 MG capsule Take 1 capsule (40 mg total) by mouth 2 (two) times daily. Take 30 minutes before breakfast and dinner  . ondansetron (ZOFRAN) 4 MG  tablet Take 1 tablet (4 mg total) by mouth every 8 (eight) hours as needed for nausea or vomiting.  . topiramate (TOPAMAX) 100 MG tablet Take 300 mg by mouth daily.  Marland Kitchen triamcinolone (KENALOG) 0.025 % cream Apply 1 application topically 2 (two) times daily.  . ziprasidone (GEODON) 60 MG capsule Take 60 mg by mouth at bedtime.  Marland Kitchen zolpidem (AMBIEN) 10 MG tablet Take 10 mg by mouth at bedtime as needed for sleep.  . [DISCONTINUED] FLUoxetine (PROZAC) 20 MG capsule Take 80 mg by mouth daily. Takes 80 mg po  . [DISCONTINUED] solifenacin (VESICARE) 10 MG tablet Take by mouth daily.  . [DISCONTINUED] topiramate (TOPAMAX) 50 MG tablet Take 100 mg by mouth 2 (two) times daily.   No facility-administered encounter medications on file as of 12/25/2017.     Allergies as of 12/25/2017 - Review Complete 12/25/2017  Allergen Reaction Noted  . Codeine Itching 02/22/2012  . Penicillins Itching 02/22/2012  . Sulfa antibiotics Swelling 02/22/2012    Past Medical History:  Diagnosis Date  . Allergy   . Anxiety   . Arthritis   . Bipolar 2 disorder (HCC)   . Depression   . GERD (gastroesophageal reflux disease)   . HTN (hypertension)   . Morbid obesity (HCC)     Past Surgical History:  Procedure Laterality Date  . COLONOSCOPY    . NO PAST SURGERIES      Family History  Problem Relation Age of Onset  . Hypertension Mother   . Hypertension Father   .  Diabetes Paternal Aunt   . Diabetes Paternal Uncle     Social History   Socioeconomic History  . Marital status: Single    Spouse name: Not on file  . Number of children: 2  . Years of education: 2312  . Highest education level: Not on file  Social Needs  . Financial resource strain: Not on file  . Food insecurity - worry: Not on file  . Food insecurity - inability: Not on file  . Transportation needs - medical: Not on file  . Transportation needs - non-medical: Not on file  Occupational History  . Occupation: Disability  Tobacco Use  .  Smoking status: Never Smoker  . Smokeless tobacco: Never Used  Substance and Sexual Activity  . Alcohol use: No  . Drug use: No  . Sexual activity: Yes    Birth control/protection: IUD  Other Topics Concern  . Not on file  Social History Narrative   Fun/Hobby: Attend classes at the [employer]; working on going to the gym.       Review of systems: Review of Systems  Constitutional: Negative for fever and chills.  HENT: Negative.   Eyes: Negative for blurred vision.  Respiratory: Negative for cough, shortness of breath and wheezing.   Cardiovascular: Negative for chest pain and palpitations.  Gastrointestinal: as per HPI Genitourinary: Negative for dysuria, urgency, frequency and hematuria.  Musculoskeletal: Negative for myalgias, back pain and joint pain.  Skin: Negative for itching and rash.  Neurological: Negative for dizziness, tremors, focal weakness, seizures and loss of consciousness.  Endo/Heme/Allergies: Positive for seasonal allergies.  Psychiatric/Behavioral: Negative for depression, suicidal ideas and hallucinations. Positive for anxiety All other systems reviewed and are negative.   Physical Exam: Vitals:   12/25/17 0953  BP: 128/82  Pulse: 72   Body mass index is 46.99 kg/m. Gen:      No acute distress HEENT:  EOMI, sclera anicteric Neck:     No masses; no thyromegaly Lungs:    Clear to auscultation bilaterally; normal respiratory effort CV:         Regular rate and rhythm; no murmurs Abd:      + bowel sounds; soft, non-tender; no palpable masses, no distension Ext:    No edema; adequate peripheral perfusion Skin:      Warm and dry; no rash Neuro: alert and oriented x 3 Psych: normal mood and affect  Data Reviewed:  Reviewed labs, radiology imaging, old records and pertinent past GI work up   Assessment and Plan/Recommendations:  52 year old female here with complaints of intermittent dysphagia and regurgitation EGD was  unremarkable, negative for any stricture We will schedule for esophageal manometry to evaluate esophageal function, also obtain 24-hour pH study without impedance to check if patient has increased gastroesophageal acid reflux As the procedure in detail and patient agrees to proceed She is currently off PPI   Greater than 50% of the time used for counseling as well as treatment plan and follow-up. She had multiple questions which were answered to her satisfaction  K. Scherry RanVeena Nandigam , MD [phone] Mon-Fri 8a-5p [phone] after 5p, weekends, holidays  CC: Georgann HousekeeperHusain, Karrar, MD

## 2017-12-25 NOTE — Patient Instructions (Addendum)
You have been [facility] at 8:30am. Please arrive 30 minutes prior to your procedure for registration. You will need to go to outpatient registration (1st floor of the hospital) first. Make certain to bring your insurance cards as well as a complete list of medications.  Stay off your Omeprazole for 7 days before   Please remember the following:  1) Do not take any muscle relaxants, xanax (alprazolam) or ativan for 1 day prior to your     test as well as the day of the test.  2) Nothing to eat or drink after 12:00 midnight on the night before your test.  3) Hold all diabetic medications/insulin the morning of the test. You may eat and take             your medications after the test.  It will take at least 2 weeks to receive the results of this test from your physician. ------------------------------------------ ABOUT ESOPHAGEAL MANOMETRY Esophageal manometry (muh-NOM-uh-tree) is a test that gauges how well your esophagus works. Your esophagus is the long, muscular tube that connects your throat to your stomach. Esophageal manometry measures the rhythmic muscle contractions (peristalsis) that occur in your esophagus when you swallow. Esophageal manometry also measures the coordination and force exerted by the muscles of your esophagus.  During esophageal manometry, a thin, flexible tube (catheter) that contains sensors is passed through your nose, down your esophagus and into your stomach. Esophageal manometry can be helpful in diagnosing some mostly uncommon disorders that affect your esophagus.  Why it's done Esophageal manometry is used to evaluate the movement (motility) of food through the esophagus and into the stomach. The test measures how well the circular bands of muscle (sphincters) at the top and bottom of your esophagus open and close, as well as the pressure, strength and pattern of the wave of esophageal muscle  contractions that moves food along.  What you can expect Esophageal manometry is an outpatient procedure done without sedation. Most people tolerate it well. You may be asked to change into a hospital gown before the test starts.  During esophageal manometry  While you are sitting up, a member of your health care team sprays your throat with a numbing medication or puts numbing gel in your nose or both.  A catheter is guided through your nose into your esophagus. The catheter may be sheathed in a water-filled sleeve. It doesn't interfere with your breathing. However, your eyes may water, and you may gag. You may have a slight nosebleed from irritation.  After the catheter is in place, you may be asked to lie on your back on an exam table, or you may be asked to remain seated.  You then swallow small sips of water. As you do, a computer connected to the catheter records the pressure, strength and pattern of your esophageal muscle contractions.  During the test, you'll be asked to breathe slowly and smoothly, remain as still as possible, and swallow only when you're asked to do so.  A member of your health care team may move the catheter down into your stomach while the catheter continues its measurements.  The catheter then is slowly withdrawn. The test usually lasts 20 to 30 minutes.  After esophageal manometry  When your esophageal manometry is complete, you may return to your normal activities  This test typically takes 30-45 minutes to complete. ________________________________________________________________________________

## 2017-12-29 DIAGNOSIS — R192 Visible peristalsis: Secondary | ICD-10-CM

## 2017-12-29 HISTORY — DX: Visible peristalsis: R19.2

## 2018-01-01 ENCOUNTER — Emergency Department (HOSPITAL_COMMUNITY): Payer: Medicare Other

## 2018-01-01 ENCOUNTER — Encounter (HOSPITAL_COMMUNITY): Payer: Self-pay

## 2018-01-01 ENCOUNTER — Emergency Department (HOSPITAL_COMMUNITY)
Admission: EM | Admit: 2018-01-01 | Discharge: 2018-01-01 | Disposition: A | Discharge status: Home / Self Care | Payer: Medicare Other | Attending: Emergency Medicine | Admitting: Emergency Medicine

## 2018-01-01 ENCOUNTER — Other Ambulatory Visit: Payer: Self-pay

## 2018-01-01 DIAGNOSIS — I10 Essential (primary) hypertension: Secondary | ICD-10-CM | POA: Insufficient documentation

## 2018-01-01 DIAGNOSIS — S161XXA Strain of muscle, fascia and tendon at neck level, initial encounter: Secondary | ICD-10-CM

## 2018-01-01 DIAGNOSIS — Y9389 Activity, other specified: Secondary | ICD-10-CM | POA: Insufficient documentation

## 2018-01-01 DIAGNOSIS — Z79899 Other long term (current) drug therapy: Secondary | ICD-10-CM | POA: Diagnosis not present

## 2018-01-01 DIAGNOSIS — Y999 Unspecified external cause status: Secondary | ICD-10-CM | POA: Diagnosis not present

## 2018-01-01 DIAGNOSIS — Y92410 Unspecified street and highway as the place of occurrence of the external cause: Secondary | ICD-10-CM | POA: Diagnosis not present

## 2018-01-01 DIAGNOSIS — S20211A Contusion of right front wall of thorax, initial encounter: Secondary | ICD-10-CM

## 2018-01-01 DIAGNOSIS — S199XXA Unspecified injury of neck, initial encounter: Secondary | ICD-10-CM | POA: Diagnosis present

## 2018-01-01 HISTORY — DX: Unspecified urinary incontinence: R32

## 2018-01-01 MED ORDER — CYCLOBENZAPRINE HCL 10 MG PO TABS: 10.0000 mg | ORAL_TABLET | Freq: Two times a day (BID) | ORAL | 0 refills | Status: DC | PRN

## 2018-01-01 MED ORDER — IBUPROFEN 600 MG PO TABS: 600.0000 mg | ORAL_TABLET | Freq: Four times a day (QID) | ORAL | 0 refills | Status: DC | PRN
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
## 2018-01-01 NOTE — ED Notes (Signed)
Bed: WTR5 Expected date:  Expected time:  Means of arrival:  Comments: 
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
## 2018-01-08 ENCOUNTER — Ambulatory Visit (HOSPITAL_COMMUNITY)
Admission: RE | Admit: 2018-01-08 | Discharge: 2018-01-09 | Disposition: A | Discharge status: Home / Self Care | Payer: Medicare Other | Source: Ambulatory Visit | Attending: Gastroenterology | Admitting: Gastroenterology

## 2018-01-08 ENCOUNTER — Encounter (HOSPITAL_COMMUNITY)
Admission: RE | Disposition: A | Discharge status: Home / Self Care | Payer: Self-pay | Source: Ambulatory Visit | Attending: Gastroenterology

## 2018-01-08 DIAGNOSIS — R1310 Dysphagia, unspecified: Secondary | ICD-10-CM | POA: Diagnosis present

## 2018-01-08 DIAGNOSIS — K228 Other specified diseases of esophagus: Secondary | ICD-10-CM | POA: Insufficient documentation

## 2018-01-08 DIAGNOSIS — R1110 Vomiting, unspecified: Secondary | ICD-10-CM | POA: Insufficient documentation

## 2018-01-08 DIAGNOSIS — R12 Heartburn: Secondary | ICD-10-CM | POA: Diagnosis present

## 2018-01-08 DIAGNOSIS — K224 Dyskinesia of esophagus: Secondary | ICD-10-CM | POA: Insufficient documentation

## 2018-01-08 HISTORY — PX: 24 HOUR PH STUDY: SHX5419

## 2018-01-08 HISTORY — PX: ESOPHAGEAL MANOMETRY: SHX5429

## 2018-01-08 SURGERY — MANOMETRY, ESOPHAGUS

## 2018-01-08 MED ORDER — LIDOCAINE VISCOUS 2 % MT SOLN
OROMUCOSAL | Status: AC
  Filled 2018-01-08: qty 15

## 2018-01-08 SURGICAL SUPPLY — 2 items
FACESHIELD LNG OPTICON STERILE (SAFETY) IMPLANT
GLOVE BIO SURGEON STRL SZ8 (GLOVE) ×6 IMPLANT

## 2018-01-08 NOTE — Progress Notes (Signed)
Esophageal manometry performed per protocol.  Patient tolerated well without complications.  PH probe placed per protocol at 40cm.  Patient tolerated well.  Instructions given on how to use journal and monitor.  Patient instructed to return tomorrow after 9am with journal and monitor.  Patient verbalized understanding.

## 2018-01-09 ENCOUNTER — Encounter (HOSPITAL_COMMUNITY): Payer: Self-pay | Admitting: Gastroenterology

## 2018-01-09 DIAGNOSIS — R1310 Dysphagia, unspecified: Secondary | ICD-10-CM

## 2018-01-09 DIAGNOSIS — R12 Heartburn: Secondary | ICD-10-CM

## 2018-01-10 DIAGNOSIS — R12 Heartburn: Secondary | ICD-10-CM

## 2018-01-10 DIAGNOSIS — R1310 Dysphagia, unspecified: Secondary | ICD-10-CM

## 2018-01-11 ENCOUNTER — Telehealth: Payer: Self-pay

## 2018-01-11 NOTE — Telephone Encounter (Signed)
Copy of Esophageal manometry faxed to Dr Georgann HousekeeperKarrar Husain, MD of [facility]

## 2018-01-24 ENCOUNTER — Ambulatory Visit
Admission: RE | Admit: 2018-01-24 | Discharge: 2018-01-24 | Disposition: A | Discharge status: Home / Self Care | Payer: Medicare Other | Source: Ambulatory Visit | Attending: Family | Admitting: Family

## 2018-01-24 ENCOUNTER — Other Ambulatory Visit: Payer: Self-pay | Admitting: Geriatric Medicine

## 2018-01-24 DIAGNOSIS — M25561 Pain in right knee: Secondary | ICD-10-CM

## 2018-01-24 DIAGNOSIS — M25562 Pain in left knee: Secondary | ICD-10-CM

## 2018-01-26 ENCOUNTER — Telehealth: Payer: Self-pay

## 2018-01-26 NOTE — Telephone Encounter (Signed)
Let message and [facility]

## 2018-02-19 ENCOUNTER — Encounter: Payer: Self-pay | Admitting: Neurology

## 2018-02-22 ENCOUNTER — Telehealth: Payer: Self-pay

## 2018-02-22 ENCOUNTER — Ambulatory Visit (INDEPENDENT_AMBULATORY_CARE_PROVIDER_SITE_OTHER): Payer: Medicare Other | Admitting: Neurology

## 2018-02-22 ENCOUNTER — Encounter: Payer: Self-pay | Admitting: Neurology

## 2018-02-22 VITALS — BP 122/88 | HR 68 | Ht 66.0 in | Wt 282.0 lb

## 2018-02-22 DIAGNOSIS — Z789 Other specified health status: Secondary | ICD-10-CM | POA: Diagnosis not present

## 2018-02-22 DIAGNOSIS — G4733 Obstructive sleep apnea (adult) (pediatric): Secondary | ICD-10-CM | POA: Diagnosis not present
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
## 2018-02-22 NOTE — Patient Instructions (Addendum)
Please continue using your CPAP regularly. While your insurance requires that you use CPAP at least 4 hours each night on 70% of the nights, I recommend, that you not skip any nights and use it throughout the night if you can. Getting used to CPAP and staying with the treatment long term does take time and patience and discipline. Untreated obstructive sleep apnea when it is moderate to severe can have an adverse impact on cardiovascular health and raise her risk for heart disease, arrhythmias, hypertension, congestive heart failure, stroke and diabetes. Untreated obstructive sleep apnea causes sleep disruption, nonrestorative sleep, and sleep deprivation. This can have an impact on your day to day functioning and cause daytime sleepiness and impairment of cognitive function, memory loss, mood disturbance, and problems focussing. Using CPAP regularly can improve these symptoms.  Please don't drive when you are sleepy.   We will ask your DME company, [employer], to reach out to you.

## 2018-02-22 NOTE — Telephone Encounter (Signed)
Received this notice from [employer]: "Her 90 days is up 04/05/2018 as long as she meets compliance by then she will be ok.  If she can not come in sooner than July that will be ok since her insurance is [employer] they are not as picky with the second face to face."  I called pt and explained this to her. She is agreeable to using her cpap every night for at least for 4 hours and knows that the deadline to meet 70% compliance with this by 04/05/18. An appt was made with pt on 05/30/18 at 9:45am with Eber Jonesarolyn, NP. Pt verbalized understanding of the recommendations, appt date and time.

## 2018-02-26 ENCOUNTER — Other Ambulatory Visit: Payer: Self-pay | Admitting: Gastroenterology

## 2018-02-28 DIAGNOSIS — R569 Unspecified convulsions: Secondary | ICD-10-CM

## 2018-02-28 HISTORY — DX: Unspecified convulsions: R56.9

## 2018-04-09 ENCOUNTER — Encounter (HOSPITAL_COMMUNITY): Payer: Self-pay

## 2018-04-09 ENCOUNTER — Emergency Department (HOSPITAL_COMMUNITY)
Admission: EM | Admit: 2018-04-09 | Discharge: 2018-04-09 | Disposition: A | Discharge status: Home / Self Care | Payer: Medicare Other | Source: Home / Self Care | Attending: Emergency Medicine | Admitting: Emergency Medicine

## 2018-04-09 ENCOUNTER — Other Ambulatory Visit: Payer: Self-pay

## 2018-04-09 DIAGNOSIS — R7881 Bacteremia: Secondary | ICD-10-CM | POA: Diagnosis not present

## 2018-04-09 DIAGNOSIS — N390 Urinary tract infection, site not specified: Secondary | ICD-10-CM | POA: Diagnosis not present

## 2018-04-09 DIAGNOSIS — D631 Anemia in chronic kidney disease: Secondary | ICD-10-CM | POA: Diagnosis present

## 2018-04-09 DIAGNOSIS — Z5321 Procedure and treatment not carried out due to patient leaving prior to being seen by health care provider: Secondary | ICD-10-CM | POA: Insufficient documentation

## 2018-04-09 DIAGNOSIS — N183 Chronic kidney disease, stage 3 (moderate): Secondary | ICD-10-CM | POA: Diagnosis present

## 2018-04-09 DIAGNOSIS — Z7951 Long term (current) use of inhaled steroids: Secondary | ICD-10-CM

## 2018-04-09 DIAGNOSIS — Z79899 Other long term (current) drug therapy: Secondary | ICD-10-CM

## 2018-04-09 DIAGNOSIS — K219 Gastro-esophageal reflux disease without esophagitis: Secondary | ICD-10-CM | POA: Diagnosis present

## 2018-04-09 DIAGNOSIS — Z791 Long term (current) use of non-steroidal anti-inflammatories (NSAID): Secondary | ICD-10-CM

## 2018-04-09 DIAGNOSIS — B964 Proteus (mirabilis) (morganii) as the cause of diseases classified elsewhere: Secondary | ICD-10-CM | POA: Diagnosis present

## 2018-04-09 DIAGNOSIS — R350 Frequency of micturition: Secondary | ICD-10-CM

## 2018-04-09 DIAGNOSIS — M1711 Unilateral primary osteoarthritis, right knee: Secondary | ICD-10-CM | POA: Diagnosis present

## 2018-04-09 DIAGNOSIS — F3181 Bipolar II disorder: Secondary | ICD-10-CM | POA: Diagnosis present

## 2018-04-09 DIAGNOSIS — Z6841 Body Mass Index (BMI) 40.0 and over, adult: Secondary | ICD-10-CM

## 2018-04-09 DIAGNOSIS — G4730 Sleep apnea, unspecified: Secondary | ICD-10-CM | POA: Diagnosis present

## 2018-04-09 DIAGNOSIS — I129 Hypertensive chronic kidney disease with stage 1 through stage 4 chronic kidney disease, or unspecified chronic kidney disease: Secondary | ICD-10-CM | POA: Diagnosis present

## 2018-04-09 LAB — CBC WITH DIFFERENTIAL/PLATELET
Abs Immature Granulocytes: 0.1 10*3/uL (ref 0.0–0.1)
BASOS ABS: 0.1 10*3/uL (ref 0.0–0.1)
BASOS PCT: 0 %
EOS PCT: 0 %
Eosinophils Absolute: 0 10*3/uL (ref 0.0–0.7)
HCT: 36.1 % (ref 36.0–46.0)
Hemoglobin: 11.1 g/dL — ABNORMAL LOW (ref 12.0–15.0)
Immature Granulocytes: 1 %
Lymphocytes Relative: 8 %
Lymphs Abs: 1.7 10*3/uL (ref 0.7–4.0)
MCH: 23.7 pg — AB (ref 26.0–34.0)
MCHC: 30.7 g/dL (ref 30.0–36.0)
MCV: 77.1 fL — ABNORMAL LOW (ref 78.0–100.0)
Monocytes Absolute: 1.8 10*3/uL — ABNORMAL HIGH (ref 0.1–1.0)
Monocytes Relative: 9 %
Neutro Abs: 16.6 10*3/uL — ABNORMAL HIGH (ref 1.7–7.7)
Neutrophils Relative %: 82 %
PLATELETS: 284 10*3/uL (ref 150–400)
RBC: 4.68 MIL/uL (ref 3.87–5.11)
RDW: 14.6 % (ref 11.5–15.5)
WBC: 20.3 10*3/uL — ABNORMAL HIGH (ref 4.0–10.5)

## 2018-04-09 LAB — BASIC METABOLIC PANEL
Anion gap: 11 (ref 5–15)
BUN: 14 mg/dL (ref 6–20)
CO2: 18 mmol/L — ABNORMAL LOW (ref 22–32)
CREATININE: 1.73 mg/dL — AB (ref 0.44–1.00)
Calcium: 9.8 mg/dL (ref 8.9–10.3)
Chloride: 104 mmol/L (ref 101–111)
GFR calc Af Amer: 38 mL/min — ABNORMAL LOW (ref >60)
GFR, EST NON AFRICAN AMERICAN: 33 mL/min — AB (ref >60)
Glucose, Bld: 111 mg/dL — ABNORMAL HIGH (ref 65–99)
POTASSIUM: 3.9 mmol/L (ref 3.5–5.1)
SODIUM: 133 mmol/L — AB (ref 135–145)

## 2018-04-09 LAB — URINALYSIS, ROUTINE W REFLEX MICROSCOPIC
BILIRUBIN URINE: NEGATIVE
Glucose, UA: NEGATIVE mg/dL
Ketones, ur: NEGATIVE mg/dL
Nitrite: NEGATIVE
PROTEIN: 30 mg/dL — AB
Specific Gravity, Urine: 1.017 (ref 1.005–1.030)
pH: 6 (ref 5.0–8.0)

## 2018-04-09 LAB — I-STAT CG4 LACTIC ACID, ED: LACTIC ACID, VENOUS: 0.98 mmol/L (ref 0.5–1.9)

## 2018-04-09 MED ORDER — ACETAMINOPHEN 325 MG PO TABS
650.0000 mg | ORAL_TABLET | Freq: Once | ORAL | Status: AC | PRN
  Administered 2018-04-09: 650 mg via ORAL
  Filled 2018-04-09: qty 2

## 2018-04-09 NOTE — ED Triage Notes (Signed)
Pt reports that she was sent by her PCP for concern for kidney infection or bladder infection. Pt states that she has not been drinking a lot of water but has had urinary frequency.

## 2018-04-09 NOTE — ED Provider Notes (Signed)
Patient placed in Quick Look pathway, seen and evaluated   Chief Complaint: fever, UTI symptoms  HPI:  Shannon Sharp is a 52 y.o. female with hx of HTN, GERD, and mental health problems who presents to the ED with urinary frequency, urgency and fever that started 3 days ago. Patient went to her PCP this morning and was told to come to the ED for further evaluation.   ROS:   Physical Exam:  BP 136/85 (BP Location: Left Arm)   Pulse 96   Temp (!) 102.9 F (39.4 C) (Oral)   Resp 16   Ht 5\' 6"  (1.676 m)   Wt 127 kg (280 lb)   SpO2 95%   BMI 45.19 kg/m    Gen: No distress  Neuro: Awake and Alert  Skin: Warm and dry  Abdomen: soft, obese, non tender with palpation, right CVA tenderness     Initiation of care has begun. The patient has been counseled on the process, plan, and necessity for staying for the completion/evaluation, and the remainder of the medical screening examination    Janne Napoleoneese, Shannon M, NP 04/09/18 1622    Derwood Sharp, Ankit, MD 04/12/18 [phone]

## 2018-04-10 ENCOUNTER — Encounter (HOSPITAL_COMMUNITY): Payer: Self-pay | Admitting: Emergency Medicine

## 2018-04-10 ENCOUNTER — Other Ambulatory Visit: Payer: Self-pay

## 2018-04-10 ENCOUNTER — Inpatient Hospital Stay (HOSPITAL_COMMUNITY)
Admission: EM | Admit: 2018-04-10 | Discharge: 2018-04-12 | DRG: 690 | Disposition: A | Discharge status: Home / Self Care | Payer: Medicare Other | Attending: Family Medicine | Admitting: Family Medicine

## 2018-04-10 DIAGNOSIS — Z6841 Body Mass Index (BMI) 40.0 and over, adult: Secondary | ICD-10-CM | POA: Diagnosis not present

## 2018-04-10 DIAGNOSIS — R7881 Bacteremia: Secondary | ICD-10-CM | POA: Diagnosis present

## 2018-04-10 DIAGNOSIS — N390 Urinary tract infection, site not specified: Secondary | ICD-10-CM | POA: Diagnosis present

## 2018-04-10 DIAGNOSIS — B964 Proteus (mirabilis) (morganii) as the cause of diseases classified elsewhere: Secondary | ICD-10-CM | POA: Diagnosis present

## 2018-04-10 DIAGNOSIS — Z79899 Other long term (current) drug therapy: Secondary | ICD-10-CM | POA: Diagnosis not present

## 2018-04-10 DIAGNOSIS — N183 Chronic kidney disease, stage 3 unspecified: Secondary | ICD-10-CM

## 2018-04-10 DIAGNOSIS — D631 Anemia in chronic kidney disease: Secondary | ICD-10-CM | POA: Diagnosis present

## 2018-04-10 DIAGNOSIS — M1711 Unilateral primary osteoarthritis, right knee: Secondary | ICD-10-CM | POA: Diagnosis present

## 2018-04-10 DIAGNOSIS — Z7951 Long term (current) use of inhaled steroids: Secondary | ICD-10-CM | POA: Diagnosis not present

## 2018-04-10 DIAGNOSIS — G4730 Sleep apnea, unspecified: Secondary | ICD-10-CM | POA: Diagnosis present

## 2018-04-10 DIAGNOSIS — Z791 Long term (current) use of non-steroidal anti-inflammatories (NSAID): Secondary | ICD-10-CM | POA: Diagnosis not present

## 2018-04-10 DIAGNOSIS — I129 Hypertensive chronic kidney disease with stage 1 through stage 4 chronic kidney disease, or unspecified chronic kidney disease: Secondary | ICD-10-CM | POA: Diagnosis present

## 2018-04-10 DIAGNOSIS — F3181 Bipolar II disorder: Secondary | ICD-10-CM | POA: Diagnosis present

## 2018-04-10 DIAGNOSIS — K219 Gastro-esophageal reflux disease without esophagitis: Secondary | ICD-10-CM | POA: Diagnosis present

## 2018-04-10 HISTORY — DX: Urinary tract infection, site not specified: N39.0

## 2018-04-10 LAB — CBC WITH DIFFERENTIAL/PLATELET
Abs Immature Granulocytes: 0.1 10*3/uL (ref 0.0–0.1)
BASOS ABS: 0 10*3/uL (ref 0.0–0.1)
BASOS PCT: 0 %
EOS PCT: 0 %
Eosinophils Absolute: 0 10*3/uL (ref 0.0–0.7)
HCT: 36 % (ref 36.0–46.0)
Hemoglobin: 11 g/dL — ABNORMAL LOW (ref 12.0–15.0)
Immature Granulocytes: 1 %
Lymphocytes Relative: 10 %
Lymphs Abs: 1.8 10*3/uL (ref 0.7–4.0)
MCH: 23.6 pg — ABNORMAL LOW (ref 26.0–34.0)
MCHC: 30.6 g/dL (ref 30.0–36.0)
MCV: 77.1 fL — ABNORMAL LOW (ref 78.0–100.0)
MONO ABS: 1.9 10*3/uL — AB (ref 0.1–1.0)
Monocytes Relative: 11 %
Neutro Abs: 14.1 10*3/uL — ABNORMAL HIGH (ref 1.7–7.7)
Neutrophils Relative %: 78 %
PLATELETS: 292 10*3/uL (ref 150–400)
RBC: 4.67 MIL/uL (ref 3.87–5.11)
RDW: 14.6 % (ref 11.5–15.5)
WBC: 17.9 10*3/uL — AB (ref 4.0–10.5)

## 2018-04-10 LAB — BLOOD CULTURE ID PANEL (REFLEXED)
Acinetobacter baumannii: NOT DETECTED
CANDIDA GLABRATA: NOT DETECTED
CANDIDA KRUSEI: NOT DETECTED
CANDIDA TROPICALIS: NOT DETECTED
Candida albicans: NOT DETECTED
Candida parapsilosis: NOT DETECTED
Carbapenem resistance: NOT DETECTED
ESCHERICHIA COLI: NOT DETECTED
Enterobacter cloacae complex: NOT DETECTED
Enterobacteriaceae species: DETECTED — AB
Enterococcus species: NOT DETECTED
Haemophilus influenzae: NOT DETECTED
KLEBSIELLA OXYTOCA: NOT DETECTED
KLEBSIELLA PNEUMONIAE: NOT DETECTED
LISTERIA MONOCYTOGENES: NOT DETECTED
Neisseria meningitidis: NOT DETECTED
PROTEUS SPECIES: DETECTED — AB
Pseudomonas aeruginosa: NOT DETECTED
SERRATIA MARCESCENS: NOT DETECTED
STAPHYLOCOCCUS SPECIES: NOT DETECTED
STREPTOCOCCUS PNEUMONIAE: NOT DETECTED
Staphylococcus aureus (BCID): NOT DETECTED
Streptococcus agalactiae: NOT DETECTED
Streptococcus pyogenes: NOT DETECTED
Streptococcus species: NOT DETECTED

## 2018-04-10 LAB — COMPREHENSIVE METABOLIC PANEL
ALT: 33 U/L (ref 14–54)
ANION GAP: 11 (ref 5–15)
AST: 35 U/L (ref 15–41)
Albumin: 3.1 g/dL — ABNORMAL LOW (ref 3.5–5.0)
Alkaline Phosphatase: 66 U/L (ref 38–126)
BUN: 13 mg/dL (ref 6–20)
CHLORIDE: 105 mmol/L (ref 101–111)
CO2: 20 mmol/L — AB (ref 22–32)
CREATININE: 1.51 mg/dL — AB (ref 0.44–1.00)
Calcium: 9.9 mg/dL (ref 8.9–10.3)
GFR calc non Af Amer: 39 mL/min — ABNORMAL LOW (ref >60)
GFR, EST AFRICAN AMERICAN: 45 mL/min — AB (ref >60)
Glucose, Bld: 101 mg/dL — ABNORMAL HIGH (ref 65–99)
Potassium: 4.1 mmol/L (ref 3.5–5.1)
SODIUM: 136 mmol/L (ref 135–145)
Total Bilirubin: 1 mg/dL (ref 0.3–1.2)
Total Protein: 7.7 g/dL (ref 6.5–8.1)

## 2018-04-10 LAB — I-STAT CG4 LACTIC ACID, ED: LACTIC ACID, VENOUS: 1.24 mmol/L (ref 0.5–1.9)

## 2018-04-10 MED ORDER — ACETAMINOPHEN 500 MG PO TABS
1000.0000 mg | ORAL_TABLET | Freq: Once | ORAL | Status: AC
  Administered 2018-04-10: 1000 mg via ORAL
  Filled 2018-04-10: qty 2

## 2018-04-10 MED ORDER — TOPIRAMATE 100 MG PO TABS
300.0000 mg | ORAL_TABLET | Freq: Every day | ORAL | Status: DC
  Administered 2018-04-11: 300 mg via ORAL
  Filled 2018-04-10 (×2): qty 3

## 2018-04-10 MED ORDER — ENOXAPARIN SODIUM 40 MG/0.4ML ~~LOC~~ SOLN
40.0000 mg | Freq: Every day | SUBCUTANEOUS | Status: DC
  Administered 2018-04-10 – 2018-04-11 (×2): 40 mg via SUBCUTANEOUS
  Filled 2018-04-10 (×2): qty 0.4

## 2018-04-10 MED ORDER — CEFTRIAXONE SODIUM 2 G IJ SOLR: 2.0000 g | INTRAMUSCULAR | Status: DC

## 2018-04-10 MED ORDER — SODIUM CHLORIDE 0.9 % IV BOLUS
500.0000 mL | Freq: Once | INTRAVENOUS | Status: AC
  Administered 2018-04-10: 500 mL via INTRAVENOUS

## 2018-04-10 MED ORDER — SODIUM CHLORIDE 0.9 % IV SOLN: 1.0000 g | Freq: Once | INTRAVENOUS | Status: DC

## 2018-04-10 MED ORDER — ACETAMINOPHEN 650 MG RE SUPP: 650.0000 mg | Freq: Four times a day (QID) | RECTAL | Status: DC | PRN

## 2018-04-10 MED ORDER — ONDANSETRON HCL 4 MG PO TABS: 4.0000 mg | ORAL_TABLET | Freq: Four times a day (QID) | ORAL | Status: DC | PRN

## 2018-04-10 MED ORDER — ZOLPIDEM TARTRATE 5 MG PO TABS
5.0000 mg | ORAL_TABLET | Freq: Every day | ORAL | Status: DC
  Administered 2018-04-10 – 2018-04-11 (×2): 5 mg via ORAL
  Filled 2018-04-10 (×2): qty 1

## 2018-04-10 MED ORDER — SODIUM CHLORIDE 0.9 % IV SOLN
INTRAVENOUS | Status: AC
  Administered 2018-04-10 – 2018-04-11 (×2): via INTRAVENOUS

## 2018-04-10 MED ORDER — CLONAZEPAM 1 MG PO TABS
1.0000 mg | ORAL_TABLET | Freq: Two times a day (BID) | ORAL | Status: DC
  Administered 2018-04-10 – 2018-04-12 (×4): 1 mg via ORAL
  Filled 2018-04-10 (×3): qty 1
  Filled 2018-04-10: qty 2

## 2018-04-10 MED ORDER — ZIPRASIDONE HCL 40 MG PO CAPS
60.0000 mg | ORAL_CAPSULE | Freq: Every day | ORAL | Status: DC
  Administered 2018-04-10 – 2018-04-11 (×2): 60 mg via ORAL
  Filled 2018-04-10 (×3): qty 1

## 2018-04-10 MED ORDER — FLUOXETINE HCL 20 MG PO CAPS
80.0000 mg | ORAL_CAPSULE | Freq: Every day | ORAL | Status: DC
  Administered 2018-04-11 – 2018-04-12 (×2): 80 mg via ORAL
  Filled 2018-04-10 (×4): qty 4

## 2018-04-10 MED ORDER — FAMOTIDINE 20 MG PO TABS
20.0000 mg | ORAL_TABLET | Freq: Two times a day (BID) | ORAL | Status: DC
  Administered 2018-04-10 – 2018-04-12 (×4): 20 mg via ORAL
  Filled 2018-04-10 (×4): qty 1

## 2018-04-10 MED ORDER — ONDANSETRON HCL 4 MG/2ML IJ SOLN: 4.0000 mg | Freq: Four times a day (QID) | INTRAMUSCULAR | Status: DC | PRN

## 2018-04-10 MED ORDER — ALBUTEROL SULFATE (2.5 MG/3ML) 0.083% IN NEBU
3.0000 mL | INHALATION_SOLUTION | RESPIRATORY_TRACT | Status: DC | PRN
Start: 2018-04-10 — End: 2018-04-12

## 2018-04-10 MED ORDER — FESOTERODINE FUMARATE ER 4 MG PO TB24
4.0000 mg | ORAL_TABLET | Freq: Every day | ORAL | Status: DC
  Administered 2018-04-11 – 2018-04-12 (×2): 4 mg via ORAL
  Filled 2018-04-10 (×2): qty 1

## 2018-04-10 MED ORDER — BUPROPION HCL ER (XL) 150 MG PO TB24
150.0000 mg | ORAL_TABLET | Freq: Every day | ORAL | Status: DC
  Administered 2018-04-11 – 2018-04-12 (×2): 150 mg via ORAL
  Filled 2018-04-10 (×2): qty 1

## 2018-04-10 MED ORDER — CEFTRIAXONE SODIUM 1 G IJ SOLR
1.0000 g | Freq: Once | INTRAMUSCULAR | Status: AC
  Administered 2018-04-10: 1 g via INTRAVENOUS
  Filled 2018-04-10: qty 10

## 2018-04-10 MED ORDER — ATOMOXETINE HCL 40 MG PO CAPS
80.0000 mg | ORAL_CAPSULE | Freq: Every day | ORAL | Status: DC
  Administered 2018-04-11: 80 mg via ORAL
  Filled 2018-04-10 (×3): qty 2

## 2018-04-10 MED ORDER — IRBESARTAN 300 MG PO TABS
300.0000 mg | ORAL_TABLET | Freq: Every day | ORAL | Status: DC
Start: 2018-04-10 — End: 2018-04-11
  Administered 2018-04-11: 300 mg via ORAL
  Filled 2018-04-10 (×3): qty 1

## 2018-04-10 MED ORDER — ACETAMINOPHEN 325 MG PO TABS
650.0000 mg | ORAL_TABLET | Freq: Four times a day (QID) | ORAL | Status: DC | PRN
  Administered 2018-04-11 (×2): 650 mg via ORAL
  Filled 2018-04-10 (×2): qty 2

## 2018-04-10 NOTE — ED Provider Notes (Signed)
[facility] EMERGENCY DEPARTMENT Provider Note   CSN: [id number] Arrival date & time: 04/10/18  1216     History   Chief Complaint Chief Complaint  Patient presents with  . Urinary Tract Infection    HPI Shannon Sharp is a 52 y.o. female.  Patient with history of hypertension, overactive bladder on Detrol --presents to the emergency department after being called and told that she had positive blood cultures.  Patient presented to the ED yesterday for fever and malaise also some urinary symptoms.  Patient had blood cultures drawn at that time.  She was found to have UTI and elevated white blood cell count.  Unfortunately, patient left because of the wait times.  She states that she has had some nausea but no vomiting.  Occasionally she will have some pain in the middle of her lower back.  She denies cough or shortness of breath.  No skin rashes or skin findings.  She does not endorse having a urinary tract infection in the past but records show UTI about a year ago.  Blood cultures grew Proteus.  Urine culture grew Proteus.  Sensitivities are not yet returned. The onset of this condition was acute. The course is constant. Aggravating factors: none. Alleviating factors: none.       Past Medical History:  Diagnosis Date  . Allergy   . Anxiety   . Arthritis   . Bipolar 2 disorder (HCC)   . Depression   . GERD (gastroesophageal reflux disease)   . HTN (hypertension)   . Morbid obesity (HCC)   . Urinary incontinence     Patient Active Problem List   Diagnosis Date Noted  . Heartburn   . Dysphagia   . Seasonal allergic rhinitis 05/08/2017  . Gastroesophageal reflux disease with esophagitis 05/08/2017  . Urinary frequency 03/02/2017  . Acute pain of both knees 03/02/2017  . Class 3 severe obesity due to excess calories without serious comorbidity with body mass index (BMI) of 45.0 to 49.9 in adult (HCC) 03/02/2017  . OBESITY, NOS 12/28/2006  . ANEMIA,  IRON DEFICIENCY, UNSPEC. 12/28/2006  . DEPRESSION, MAJOR, RECURRENT 12/28/2006  . Bipolar 2 disorder (HCC) 12/28/2006  . ANXIETY 12/28/2006  . PANIC ATTACKS 12/28/2006  . OBSESSIVE COMPUL. DISORDER 12/28/2006  . ATTENTION DEFICIT, W/HYPERACTIVITY 12/28/2006  . HEARING LOSS NOS OR DEAFNESS 12/28/2006  . Atopic dermatitis 12/28/2006  . INSOMNIA NOS 12/28/2006    Past Surgical History:  Procedure Laterality Date  . 24 HOUR PH STUDY N/A 01/08/2018   Procedure: 24 HOUR PH STUDY;  Surgeon: Napoleon Form, MD;  Location: [facility];  Service: Endoscopy;  Laterality: N/A;  . COLONOSCOPY    . ESOPHAGEAL MANOMETRY N/A 01/08/2018   Procedure: ESOPHAGEAL MANOMETRY (EM);  Surgeon: Napoleon Form, MD;  Location: [facility];  Service: Endoscopy;  Laterality: N/A;  . ESOPHAGOGASTRODUODENOSCOPY ENDOSCOPY    . NO PAST SURGERIES       OB History   None      Home Medications    Prior to Admission medications   Medication Sig Start Date End Date Taking? Authorizing Provider  albuterol (PROVENTIL HFA;VENTOLIN HFA) 108 (90 Base) MCG/ACT inhaler Inhale 2 puffs into the lungs every 4 (four) hours as needed for wheezing or shortness of breath. 05/08/17  Yes Veryl Speak, FNP  atomoxetine (STRATTERA) 80 MG capsule Take 80 mg by mouth daily after supper.  11/10/17  Yes [provider]  buPROPion (WELLBUTRIN XL) 150 MG 24 hr tablet  Take 150 mg by mouth daily.  10/14/14  Yes [provider]  clonazePAM (KLONOPIN) 1 MG tablet Take 1 tablet by mouth 2 (two) times daily.  12/20/17  Yes [provider]  FLUoxetine (PROZAC) 40 MG capsule Take 80 mg by mouth daily. 12/20/17  Yes [provider]  hydrocortisone 2.5 % cream Apply 1 application topically as needed (For eczema.).  12/08/17  Yes [provider]  ibuprofen (ADVIL,MOTRIN) 600 MG tablet Take 1 tablet (600 mg total) by mouth every 6 (six) hours as needed. 01/01/18  Yes Rolan Bucco, MD  irbesartan  (AVAPRO) 300 MG tablet Take 300 mg by mouth daily. For 30 days 03/23/18  Yes [provider]  meloxicam (MOBIC) 15 MG tablet TAKE 1 TABLET BY MOUTH EVERY DAY 07/25/17  Yes Veryl Speak, FNP  ondansetron (ZOFRAN) 8 MG tablet Take 8 mg by mouth 2 (two) times daily. 04/09/18  Yes [provider]  ranitidine (ZANTAC) 150 MG tablet Take 150 mg by mouth 2 (two) times daily. 03/10/18  Yes [provider]  tolterodine (DETROL LA) 4 MG 24 hr capsule Take 4 mg by mouth daily. 11/30/17  Yes [provider]  topiramate (TOPAMAX) 100 MG tablet Take 300 mg by mouth daily after supper.  11/16/17  Yes [provider]  triamcinolone (KENALOG) 0.025 % cream Apply 1 application topically 2 (two) times daily. Patient taking differently: Apply 1 application topically 2 (two) times daily as needed (For eczema.).  07/10/17  Yes Veryl Speak, FNP  ziprasidone (GEODON) 60 MG capsule Take 60 mg by mouth at bedtime.   Yes [provider]  zolpidem (AMBIEN) 10 MG tablet Take 10 mg by mouth at bedtime.    Yes [provider]    Family History Family History  Problem Relation Age of Onset  . Hypertension Mother   . Hypertension Father   . Diabetes Paternal Aunt   . Diabetes Paternal Uncle     Social History Social History   Tobacco Use  . Smoking status: Never Smoker  . Smokeless tobacco: Never Used  Substance Use Topics  . Alcohol use: No  . Drug use: No     Allergies   Penicillins and Sulfa antibiotics   Review of Systems Review of Systems  Constitutional: Positive for fatigue and fever.  HENT: Negative for rhinorrhea and sore throat.   Eyes: Negative for redness.  Respiratory: Negative for cough.   Cardiovascular: Negative for chest pain.  Gastrointestinal: Negative for abdominal pain, diarrhea, nausea and vomiting.  Genitourinary: Positive for frequency. Negative for dysuria.  Musculoskeletal: Positive for back pain. Negative for  myalgias.  Skin: Negative for rash.  Neurological: Negative for headaches.     Physical Exam Updated Vital Signs BP 140/88 (BP Location: Right Arm)   Pulse 95   Temp 98.1 F (36.7 C) (Oral)   Resp 16   Ht 5\' 6"  (1.676 m)   Wt 127 kg (280 lb)   SpO2 95%   BMI 45.19 kg/m   Physical Exam  Constitutional: She appears well-developed and well-nourished.  HENT:  Head: Normocephalic and atraumatic.  Mouth/Throat: Oropharynx is clear and moist.  Eyes: Conjunctivae are normal. Right eye exhibits no discharge. Left eye exhibits no discharge.  Neck: Normal range of motion. Neck supple.  Cardiovascular: Normal rate, regular rhythm and normal heart sounds.  Pulmonary/Chest: Effort normal and breath sounds normal.  Abdominal: Soft. There is no tenderness. There is no rebound, no guarding and no CVA tenderness.  Neurological: She is alert.  Skin: Skin is warm and dry.  Psychiatric: She has a normal mood and affect.  Nursing note and vitals reviewed.    ED Treatments / Results  Labs (all labs ordered are listed, but only abnormal results are displayed) Labs Reviewed  COMPREHENSIVE METABOLIC PANEL - Abnormal; Notable for the following components:      Result Value   CO2 20 (*)    Glucose, Bld 101 (*)    Creatinine, Ser 1.51 (*)    Albumin 3.1 (*)    GFR calc non Af Amer 39 (*)    GFR calc Af Amer 45 (*)    All other components within normal limits  CBC WITH DIFFERENTIAL/PLATELET - Abnormal; Notable for the following components:   WBC 17.9 (*)    Hemoglobin 11.0 (*)    MCV 77.1 (*)    MCH 23.6 (*)    Neutro Abs 14.1 (*)    Monocytes Absolute 1.9 (*)    All other components within normal limits  URINE CULTURE  I-STAT CG4 LACTIC ACID, ED  I-STAT CG4 LACTIC ACID, ED    EKG None  Radiology No results found.  Procedures Procedures (including critical care time)  Medications Ordered in ED Medications  cefTRIAXone (ROCEPHIN) 1 g in sodium chloride 0.9 % 100 mL IVPB (1 g  Intravenous New Bag/Given 04/10/18 2021)  sodium chloride 0.9 % bolus 500 mL (500 mLs Intravenous New Bag/Given 04/10/18 2021)  acetaminophen (TYLENOL) tablet 1,000 mg (has no administration in time range)     Initial Impression / Assessment and Plan / ED Course  I have reviewed the triage vital signs and the nursing notes.  Pertinent labs & imaging results that were available during my care of the patient were reviewed by me and considered in my medical decision making (see chart for details).     Patient seen and examined. Work-up initiated. Medications ordered.   Vital signs reviewed and are as follows: BP 140/88 (BP Location: Right Arm)   Pulse 95   Temp (S) (!) 102.6 F (39.2 C) (Oral)   Resp 16   Ht 5\' 6"  (1.676 m)   Wt 127 kg (280 lb)   SpO2 95%   BMI 45.19 kg/m   Reviewed blood and urine cultures. One year ago pt had Proteus UTI pan-sensitive except to Macrobid.   8:44 PM Temp now 102F. Tylenol ordered. Spoke with Dr. Toniann FailKakrakandy who will see.   Discussed previously with Dr. Fredderick PhenixBelfi.    Final Clinical Impressions(s) / ED Diagnoses   Final diagnoses:  Bacteremia  Urinary tract infection without hematuria, site unspecified   Proteus UTI/bacteremia. Admit.   ED Discharge Orders    None       Renne CriglerGeiple, Azavier Creson, Cordelia Poche-C 04/10/18 2047    Rolan BuccoBelfi, Melanie, MD 04/10/18 2055

## 2018-04-10 NOTE — ED Triage Notes (Signed)
Patient states she was called and told to return to ED for concern about an infection in her bladder and/or kidneys. Patient denies pain at this time. Patient alert, oriented, and in no apparent distress at this time.

## 2018-04-10 NOTE — H&P (Signed)
History and Physical    Shannon ElkMelodney A Perdew ZOX:[id number] DOB: 07/08/1966 DOA: 04/10/2018  PCP: Georgann HousekeeperHusain, Karrar, MD  Patient coming from: Home.  Chief Complaint: Positive blood cultures.  HPI: Shannon Sharp is a 52 y.o. female with history of bipolar disorder, hypertension, chronic anemia, chronic kidney disease has not been feeling well for last 1 week with subjective feeling of fever chills.  Denies any chest pain shortness of breath nausea vomiting diarrhea.  Patient had gone to her PCP and was advised to come to the ER with concerns of UTI.  Patient come to the ER on April 09, 2018 yesterday and had blood cultures and UA done and was waiting in the ER but due to long wait times patient went back home.  His blood cultures grew positive for Proteus mirabilis and also was urine culture positive for Proteus.  ED Course: The ER patient was tachycardic febrile.  Repeat blood pressure was obtained and started on ceftriaxone.  Sensitivities are still pending.  Patient had similar urinary tract infection a year ago which was growing Proteus which was sensitive to ceftriaxone.  On exam patient is not in distress.  Review of Systems: As per HPI, rest all negative.   Past Medical History:  Diagnosis Date  . Allergy   . Anxiety   . Arthritis   . Bipolar 2 disorder (HCC)   . Depression   . GERD (gastroesophageal reflux disease)   . HTN (hypertension)   . Morbid obesity (HCC)   . Urinary incontinence     Past Surgical History:  Procedure Laterality Date  . 24 HOUR PH STUDY N/A 01/08/2018   Procedure: 24 HOUR PH STUDY;  Surgeon: Napoleon FormNandigam, Kavitha V, MD;  Location: [facility];  Service: Endoscopy;  Laterality: N/A;  . COLONOSCOPY    . ESOPHAGEAL MANOMETRY N/A 01/08/2018   Procedure: ESOPHAGEAL MANOMETRY (EM);  Surgeon: Napoleon FormNandigam, Kavitha V, MD;  Location: [facility];  Service: Endoscopy;  Laterality: N/A;  . ESOPHAGOGASTRODUODENOSCOPY ENDOSCOPY    . NO PAST SURGERIES       reports  that she has never smoked. She has never used smokeless tobacco. She reports that she does not drink alcohol or use drugs.  Allergies  Allergen Reactions  . Penicillins Itching and Other (See Comments)    Has patient had a PCN reaction causing immediate rash, facial/tongue/throat swelling, SOB or lightheadedness with hypotension: no Has patient had a PCN reaction causing severe rash involving mucus membranes or skin necrosis: no Has patient had a PCN reaction that required hospitalization: no Has patient had a PCN reaction occurring within the last 10 years: no If all of the above answers are "NO", then may proceed with Cephalosporin use.   . Sulfa Antibiotics Swelling    Family History  Problem Relation Age of Onset  . Hypertension Mother   . Hypertension Father   . Diabetes Paternal Aunt   . Diabetes Paternal Uncle     Prior to Admission medications   Medication Sig Start Date End Date Taking? Authorizing Provider  albuterol (PROVENTIL HFA;VENTOLIN HFA) 108 (90 Base) MCG/ACT inhaler Inhale 2 puffs into the lungs every 4 (four) hours as needed for wheezing or shortness of breath. 05/08/17  Yes Veryl Speakalone, Gregory D, FNP  atomoxetine (STRATTERA) 80 MG capsule Take 80 mg by mouth daily after supper.  11/10/17  Yes [provider]  buPROPion (WELLBUTRIN XL) 150 MG 24 hr tablet Take 150 mg by mouth daily.  10/14/14  Yes [provider]  clonazePAM (KLONOPIN) 1 MG tablet Take 1 tablet by mouth 2 (two) times daily.  12/20/17  Yes [provider]  FLUoxetine (PROZAC) 40 MG capsule Take 80 mg by mouth daily. 12/20/17  Yes [provider]  hydrocortisone 2.5 % cream Apply 1 application topically as needed (For eczema.).  12/08/17  Yes [provider]  ibuprofen (ADVIL,MOTRIN) 600 MG tablet Take 1 tablet (600 mg total) by mouth every 6 (six) hours as needed. 01/01/18  Yes Rolan Bucco, MD  irbesartan (AVAPRO) 300 MG tablet Take 300 mg by mouth daily. For 30  days 03/23/18  Yes [provider]  meloxicam (MOBIC) 15 MG tablet TAKE 1 TABLET BY MOUTH EVERY DAY 07/25/17  Yes Veryl Speak, FNP  ondansetron (ZOFRAN) 8 MG tablet Take 8 mg by mouth 2 (two) times daily. 04/09/18  Yes [provider]  ranitidine (ZANTAC) 150 MG tablet Take 150 mg by mouth 2 (two) times daily. 03/10/18  Yes [provider]  tolterodine (DETROL LA) 4 MG 24 hr capsule Take 4 mg by mouth daily. 11/30/17  Yes [provider]  topiramate (TOPAMAX) 100 MG tablet Take 300 mg by mouth daily after supper.  11/16/17  Yes [provider]  triamcinolone (KENALOG) 0.025 % cream Apply 1 application topically 2 (two) times daily. Patient taking differently: Apply 1 application topically 2 (two) times daily as needed (For eczema.).  07/10/17  Yes Veryl Speak, FNP  ziprasidone (GEODON) 60 MG capsule Take 60 mg by mouth at bedtime.   Yes [provider]  zolpidem (AMBIEN) 10 MG tablet Take 10 mg by mouth at bedtime.    Yes [provider]    Physical Exam: Vitals:   04/10/18 1224 04/10/18 1449 04/10/18 1734 04/10/18 2036  BP: (!) 141/88 (!) 127/95 140/88   Pulse: 92 91 95   Resp: 16 18 16    Temp: 98.1 F (36.7 C)   (S) (!) 102.6 F (39.2 C)  TempSrc: Oral   Oral  SpO2: 99% 99% 95%   Weight: 127 kg (280 lb)     Height: 5\' 6"  (1.676 m)         Constitutional: Moderately built and nourished. Vitals:   04/10/18 1224 04/10/18 1449 04/10/18 1734 04/10/18 2036  BP: (!) 141/88 (!) 127/95 140/88   Pulse: 92 91 95   Resp: 16 18 16    Temp: 98.1 F (36.7 C)   (S) (!) 102.6 F (39.2 C)  TempSrc: Oral   Oral  SpO2: 99% 99% 95%   Weight: 127 kg (280 lb)     Height: 5\' 6"  (1.676 m)      Eyes: Anicteric no pallor. ENMT: No discharge from the ears eyes nose or mouth. Neck: No mass felt.  No neck rigidity. Respiratory: No rhonchi or crepitations. Cardiovascular: S1-S2 heard no murmurs appreciated. Abdomen: Soft nontender  bowel sounds present. Musculoskeletal: No edema.  No joint effusion. Skin: No rash. Neurologic: Alert awake oriented to time place and person.  Moves all extremities. Psychiatric: Appears normal per normal affect.   Labs on Admission: I have personally reviewed following labs and imaging studies  CBC: Recent Labs  Lab 04/09/18 1613 04/10/18 1230  WBC 20.3* 17.9*  NEUTROABS 16.6* 14.1*  HGB 11.1* 11.0*  HCT 36.1 36.0  MCV 77.1* 77.1*  PLT 284 292   Basic Metabolic Panel: Recent Labs  Lab 04/09/18 1612 04/10/18 1230  NA 133* 136  K 3.9 4.1  CL 104 105  CO2 18* 20*  GLUCOSE 111* 101*  BUN 14 13  CREATININE 1.73* 1.51*  CALCIUM 9.8 9.9   GFR: Estimated Creatinine Clearance: 59.4 mL/min (A) (by C-G formula based on SCr of 1.51 mg/dL (H)). Liver Function Tests: Recent Labs  Lab 04/10/18 1230  AST 35  ALT 33  ALKPHOS 66  BILITOT 1.0  PROT 7.7  ALBUMIN 3.1*   No results for input(s): LIPASE, AMYLASE in the last 168 hours. No results for input(s): AMMONIA in the last 168 hours. Coagulation Profile: No results for input(s): INR, PROTIME in the last 168 hours. Cardiac Enzymes: No results for input(s): CKTOTAL, CKMB, CKMBINDEX, TROPONINI in the last 168 hours. BNP (last 3 results) No results for input(s): PROBNP in the last 8760 hours. HbA1C: No results for input(s): HGBA1C in the last 72 hours. CBG: No results for input(s): GLUCAP in the last 168 hours. Lipid Profile: No results for input(s): CHOL, HDL, LDLCALC, TRIG, CHOLHDL, LDLDIRECT in the last 72 hours. Thyroid Function Tests: No results for input(s): TSH, T4TOTAL, FREET4, T3FREE, THYROIDAB in the last 72 hours. Anemia Panel: No results for input(s): VITAMINB12, FOLATE, FERRITIN, TIBC, IRON, RETICCTPCT in the last 72 hours. Urine analysis:    Component Value Date/Time   COLORURINE AMBER (A) 04/09/2018 1717   APPEARANCEUR TURBID (A) 04/09/2018 1717   LABSPEC 1.017 04/09/2018 1717   PHURINE 6.0  04/09/2018 1717   GLUCOSEU NEGATIVE 04/09/2018 1717   HGBUR MODERATE (A) 04/09/2018 1717   BILIRUBINUR NEGATIVE 04/09/2018 1717   KETONESUR NEGATIVE 04/09/2018 1717   PROTEINUR 30 (A) 04/09/2018 1717   NITRITE NEGATIVE 04/09/2018 1717   LEUKOCYTESUR LARGE (A) 04/09/2018 1717   Sepsis Labs: @LABRCNTIP (procalcitonin:4,lacticidven:4) ) Recent Results (from the past 240 hour(s))  Blood culture (routine x 2)     Status: None (Preliminary result)   Collection Time: 04/09/18  4:32 PM  Result Value Ref Range Status   Specimen Description BLOOD LEFT HAND  Final   Special Requests   Final    BOTTLES DRAWN AEROBIC AND ANAEROBIC Blood Culture adequate volume   Culture  Setup Time   Final    GRAM NEGATIVE RODS AEROBIC BOTTLE ONLY Organism ID to follow CRITICAL RESULT CALLED TO, READ BACK BY AND VERIFIED WITH: Paulita Cradle RN 10:45 04/10/18 ([email or web address]) Performed at [facility], [address] [place], [place] [address]    Culture GRAM NEGATIVE RODS  Final   Report Status PENDING  Incomplete  Blood Culture ID Panel (Reflexed)     Status: Abnormal   Collection Time: 04/09/18  4:32 PM  Result Value Ref Range Status   Enterococcus species NOT DETECTED NOT DETECTED Final   Listeria monocytogenes NOT DETECTED NOT DETECTED Final   Staphylococcus species NOT DETECTED NOT DETECTED Final   Staphylococcus aureus NOT DETECTED NOT DETECTED Final   Streptococcus species NOT DETECTED NOT DETECTED Final   Streptococcus agalactiae NOT DETECTED NOT DETECTED Final   Streptococcus pneumoniae NOT DETECTED NOT DETECTED Final   Streptococcus pyogenes NOT DETECTED NOT DETECTED Final   Acinetobacter baumannii NOT DETECTED NOT DETECTED Final   Enterobacteriaceae species DETECTED (A) NOT DETECTED Final    Comment: Enterobacteriaceae represent a large family of gram-negative bacteria, not a single organism. CRITICAL RESULT CALLED TO, READ BACK BY AND VERIFIED WITH: Paulita Cradle RN 10:45 04/10/18 ([email or web address])     Enterobacter cloacae complex NOT DETECTED NOT DETECTED Final   Escherichia coli NOT DETECTED NOT DETECTED Final   Klebsiella oxytoca NOT DETECTED NOT DETECTED Final   Klebsiella pneumoniae NOT DETECTED NOT  DETECTED Final   Proteus species DETECTED (A) NOT DETECTED Final    Comment: CRITICAL RESULT CALLED TO, READ BACK BY AND VERIFIED WITH: Paulita Cradle RN 10:45 04/10/18 ([email or web address])    Serratia marcescens NOT DETECTED NOT DETECTED Final   Carbapenem resistance NOT DETECTED NOT DETECTED Final   Haemophilus influenzae NOT DETECTED NOT DETECTED Final   Neisseria meningitidis NOT DETECTED NOT DETECTED Final   Pseudomonas aeruginosa NOT DETECTED NOT DETECTED Final   Candida albicans NOT DETECTED NOT DETECTED Final   Candida glabrata NOT DETECTED NOT DETECTED Final   Candida krusei NOT DETECTED NOT DETECTED Final   Candida parapsilosis NOT DETECTED NOT DETECTED Final   Candida tropicalis NOT DETECTED NOT DETECTED Final    Comment: Performed at [facility], [address], [place], [place] [address]  Urine culture     Status: Abnormal (Preliminary result)   Collection Time: 04/09/18  5:34 PM  Result Value Ref Range Status   Specimen Description URINE, CLEAN CATCH  Final   Special Requests   Final    NONE Performed at [facility], [address] [place], [place] [address]    Culture >=100,000 COLONIES/mL PROTEUS MIRABILIS (A)  Final   Report Status PENDING  Incomplete     Radiological Exams on Admission: No results found.    Assessment/Plan Principal Problem:   Bacteremia Active Problems:   Bipolar 2 disorder (HCC)   Acute lower UTI    1. Proteus mirabilis bacteremia with UTI -patient is on ceftriaxone.  Discussed with pharmacist.  Follow repeat cultures and also sensitivities.  On gentle hydration. 2. Hypertension on ARB.  Note that patient has chronic kidney disease closely follow metabolic panel. 3. Chronic kidney disease stage III creatinine appears to be at  baseline.  Follow metabolic panel.  See # #2. 4. Bipolar disorder on Geodon Prozac Wellbutrin Klonopin. 5. History of sleep apnea on CPAP. 6. Patient also takes Strattera and Topamax. 7. History of osteoarthritis of the right knee.   DVT prophylaxis: Lovenox. Code Status: Full code. Family Communication: Discussed with patient. Disposition Plan: Home. Consults called: None. Admission status: Inpatient.   Eduard Clos MD [facility] Pager [phone].  If 7PM-7AM, please contact night-coverage [email or web address] Password [facility]  04/10/2018, 9:03 PM

## 2018-04-10 NOTE — Progress Notes (Signed)
PHARMACY NOTE:  ANTIMICROBIAL RENAL DOSAGE ADJUSTMENT  Current antimicrobial regimen includes a mismatch between antimicrobial dosage and estimated renal function.  As per policy approved by the Pharmacy & Therapeutics and Medical Executive Committees, the antimicrobial dosage will be adjusted accordingly.  Current antimicrobial dosage:  Rocephin 1gm IV Q24H  Indication: bacteremia with weight 127 kg  Renal Function:  Estimated Creatinine Clearance: 59.4 mL/min (A) (by C-G formula based on SCr of 1.51 mg/dL (H)). []      On intermittent HD, scheduled: []      On CRRT    Antimicrobial dosage has been changed to:  Rocephin 2gm IV Q24H  Additional comments:   Thank you for allowing pharmacy to be a part of this patient's care.  Daryan Cagley D. Laney Potashang, PharmD, BCPS, BCCCP Pager:  [phone] 04/10/2018, 9:40 PM

## 2018-04-10 NOTE — ED Notes (Signed)
Follow up call made  Pt to call her md   04/10/18  0955 s Minnette Merida rn

## 2018-04-10 NOTE — ED Notes (Signed)
Positive blood culture reported to EDP [place] and states have pt to return to hospital for possible admission, need of IV antibiotics. Pt notified and states will return here.

## 2018-04-11 ENCOUNTER — Encounter (HOSPITAL_COMMUNITY): Payer: Self-pay | Admitting: General Practice

## 2018-04-11 DIAGNOSIS — N390 Urinary tract infection, site not specified: Principal | ICD-10-CM

## 2018-04-11 DIAGNOSIS — R7881 Bacteremia: Secondary | ICD-10-CM

## 2018-04-11 DIAGNOSIS — N183 Chronic kidney disease, stage 3 unspecified: Secondary | ICD-10-CM

## 2018-04-11 DIAGNOSIS — F3181 Bipolar II disorder: Secondary | ICD-10-CM

## 2018-04-11 LAB — BASIC METABOLIC PANEL
ANION GAP: 13 (ref 5–15)
BUN: 13 mg/dL (ref 6–20)
CALCIUM: 9.9 mg/dL (ref 8.9–10.3)
CO2: 19 mmol/L — ABNORMAL LOW (ref 22–32)
Chloride: 105 mmol/L (ref 101–111)
Creatinine, Ser: 1.4 mg/dL — ABNORMAL HIGH (ref 0.44–1.00)
GFR calc Af Amer: 49 mL/min — ABNORMAL LOW (ref >60)
GFR, EST NON AFRICAN AMERICAN: 42 mL/min — AB (ref >60)
Glucose, Bld: 84 mg/dL (ref 65–99)
POTASSIUM: 4.5 mmol/L (ref 3.5–5.1)
SODIUM: 137 mmol/L (ref 135–145)

## 2018-04-11 LAB — HEPATIC FUNCTION PANEL
ALBUMIN: 3 g/dL — AB (ref 3.5–5.0)
ALT: 29 U/L (ref 14–54)
AST: 29 U/L (ref 15–41)
Alkaline Phosphatase: 62 U/L (ref 38–126)
Bilirubin, Direct: 0.2 mg/dL (ref 0.1–0.5)
Indirect Bilirubin: 0.5 mg/dL (ref 0.3–0.9)
TOTAL PROTEIN: 7.6 g/dL (ref 6.5–8.1)
Total Bilirubin: 0.7 mg/dL (ref 0.3–1.2)

## 2018-04-11 LAB — HIV ANTIBODY (ROUTINE TESTING W REFLEX): HIV SCREEN 4TH GENERATION: NONREACTIVE

## 2018-04-11 LAB — URINE CULTURE: Culture: >=100000 — AB

## 2018-04-11 LAB — CBC
HEMATOCRIT: 36.8 % (ref 36.0–46.0)
Hemoglobin: 11 g/dL — ABNORMAL LOW (ref 12.0–15.0)
MCH: 23.8 pg — ABNORMAL LOW (ref 26.0–34.0)
MCHC: 29.9 g/dL — AB (ref 30.0–36.0)
MCV: 79.7 fL (ref 78.0–100.0)
Platelets: 220 10*3/uL (ref 150–400)
RBC: 4.62 MIL/uL (ref 3.87–5.11)
RDW: 14.7 % (ref 11.5–15.5)
WBC: 14.2 10*3/uL — AB (ref 4.0–10.5)

## 2018-04-11 MED ORDER — SODIUM CHLORIDE 0.9 % IV SOLN
2.0000 g | INTRAVENOUS | Status: DC
  Administered 2018-04-11: 2 g via INTRAVENOUS
  Filled 2018-04-11 (×3): qty 20

## 2018-04-11 NOTE — Progress Notes (Signed)
Received pt from [facility], A&O x4. No pain

## 2018-04-11 NOTE — ED Notes (Signed)
ED TO INPATIENT HANDOFF REPORT  Name/Age/Gender Shannon Sharp 52 y.o. female  Code Status    Code Status Orders  (From admission, onward)        Start     Ordered   04/10/18 2100  Full code  Continuous     04/10/18 2100    Code Status History    This patient has a current code status but no historical code status.      Home/SNF/Other Home  Chief Complaint sent by dr/bladder/kidney infection to be admitted  Level of Care/Admitting Diagnosis ED Disposition    ED Disposition Condition [place]: [place] [[id number]]  Level of Care: Med-Surg [16]  Diagnosis: Bacteremia [790.7.ICD-9-CM]  Admitting Physician: Rise Patience [[id number]]  Attending Physician: Rise Patience [phone]  Estimated length of stay: past midnight tomorrow  Certification:: I certify this patient will need inpatient services for at least 2 midnights  PT Class (Do Not Modify): Inpatient [101]  PT Acc Code (Do Not Modify): Private [1]       Medical History Past Medical History:  Diagnosis Date  . Allergy   . Anxiety   . Arthritis   . Bipolar 2 disorder ([place])   . Depression   . GERD (gastroesophageal reflux disease)   . HTN (hypertension)   . Morbid obesity ([place])   . Urinary incontinence     Allergies Allergies  Allergen Reactions  . Penicillins Itching and Other (See Comments)    Has patient had a PCN reaction causing immediate rash, facial/tongue/throat swelling, SOB or lightheadedness with hypotension: no Has patient had a PCN reaction causing severe rash involving mucus membranes or skin necrosis: no Has patient had a PCN reaction that required hospitalization: no Has patient had a PCN reaction occurring within the last 10 years: no If all of the above answers are "NO", then may proceed with Cephalosporin use.   . Sulfa Antibiotics Swelling    IV Location/Drains/Wounds Patient Lines/Drains/Airways Status   Active  Line/Drains/Airways    Name:   Placement date:   Placement time:   Site:   Days:   Peripheral IV 04/11/18 Left;Lateral Forearm   04/11/18    0420    Forearm   less than 1          Labs/Imaging Results for orders placed or performed during the hospital encounter of 04/10/18 (from the past 48 hour(s))  Comprehensive metabolic panel     Status: Abnormal   Collection Time: 04/10/18 12:30 PM  Result Value Ref Range   Sodium 136 135 - 145 mmol/L   Potassium 4.1 3.5 - 5.1 mmol/L   Chloride 105 101 - 111 mmol/L   CO2 20 (L) 22 - 32 mmol/L   Glucose, Bld 101 (H) 65 - 99 mg/dL   BUN 13 6 - 20 mg/dL   Creatinine, Ser 1.51 (H) 0.44 - 1.00 mg/dL   Calcium 9.9 8.9 - 10.3 mg/dL   Total Protein 7.7 6.5 - 8.1 g/dL   Albumin 3.1 (L) 3.5 - 5.0 g/dL   AST 35 15 - 41 U/L   ALT 33 14 - 54 U/L   Alkaline Phosphatase 66 38 - 126 U/L   Total Bilirubin 1.0 0.3 - 1.2 mg/dL   GFR calc non Af Amer 39 (L) >60 mL/min   GFR calc Af Amer 45 (L) >60 mL/min    Comment: (NOTE) The eGFR has been calculated using the CKD EPI equation. This calculation has not  been validated in all clinical situations. eGFR's persistently <60 mL/min signify possible Chronic Kidney Disease.    Anion gap 11 5 - 15    Comment: Performed at [facility] [address] [place], [place] [address]  CBC with Differential     Status: Abnormal   Collection Time: 04/10/18 12:30 PM  Result Value Ref Range   WBC 17.9 (H) 4.0 - 10.5 K/uL   RBC 4.67 3.87 - 5.11 MIL/uL   Hemoglobin 11.0 (L) 12.0 - 15.0 g/dL   HCT 36.0 36.0 - 46.0 %   MCV 77.1 (L) 78.0 - 100.0 fL   MCH 23.6 (L) 26.0 - 34.0 pg   MCHC 30.6 30.0 - 36.0 g/dL   RDW 14.6 11.5 - 15.5 %   Platelets 292 150 - 400 K/uL   Neutrophils Relative % 78 %   Neutro Abs 14.1 (H) 1.7 - 7.7 K/uL   Lymphocytes Relative 10 %   Lymphs Abs 1.8 0.7 - 4.0 K/uL   Monocytes Relative 11 %   Monocytes Absolute 1.9 (H) 0.1 - 1.0 K/uL   Eosinophils Relative 0 %   Eosinophils Absolute 0.0  0.0 - 0.7 K/uL   Basophils Relative 0 %   Basophils Absolute 0.0 0.0 - 0.1 K/uL   Immature Granulocytes 1 %   Abs Immature Granulocytes 0.1 0.0 - 0.1 K/uL    Comment: Performed at [facility], [place] [address], [place], [place] [address]  I-Stat CG4 Lactic Acid, ED     Status: None   Collection Time: 04/10/18 12:59 PM  Result Value Ref Range   Lactic Acid, Venous 1.24 0.5 - 1.9 mmol/L  Basic metabolic panel     Status: Abnormal   Collection Time: 04/11/18  3:30 AM  Result Value Ref Range   Sodium 137 135 - 145 mmol/L   Potassium 4.5 3.5 - 5.1 mmol/L   Chloride 105 101 - 111 mmol/L   CO2 19 (L) 22 - 32 mmol/L   Glucose, Bld 84 65 - 99 mg/dL   BUN 13 6 - 20 mg/dL   Creatinine, Ser 1.40 (H) 0.44 - 1.00 mg/dL   Calcium 9.9 8.9 - 10.3 mg/dL   GFR calc non Af Amer 42 (L) >60 mL/min   GFR calc Af Amer 49 (L) >60 mL/min    Comment: (NOTE) The eGFR has been calculated using the CKD EPI equation. This calculation has not been validated in all clinical situations. eGFR's persistently <60 mL/min signify possible Chronic Kidney Disease.    Anion gap 13 5 - 15    Comment: Performed at [facility] [address], [place], [place] [address]  CBC     Status: Abnormal   Collection Time: 04/11/18  3:30 AM  Result Value Ref Range   WBC 14.2 (H) 4.0 - 10.5 K/uL   RBC 4.62 3.87 - 5.11 MIL/uL   Hemoglobin 11.0 (L) 12.0 - 15.0 g/dL   HCT 36.8 36.0 - 46.0 %   MCV 79.7 78.0 - 100.0 fL   MCH 23.8 (L) 26.0 - 34.0 pg   MCHC 29.9 (L) 30.0 - 36.0 g/dL   RDW 14.7 11.5 - 15.5 %   Platelets 220 150 - 400 K/uL    Comment: Performed at [facility], [place] [address] [place], [place] [address]  Hepatic function panel     Status: Abnormal   Collection Time: 04/11/18  3:30 AM  Result Value Ref Range   Total Protein 7.6 6.5 -  8.1 g/dL   Albumin 3.0 (L) 3.5 - 5.0 g/dL   AST 29 15 - 41 U/L   ALT 29 14 - 54 U/L   Alkaline Phosphatase 62 38 - 126 U/L   Total Bilirubin 0.7 0.3 - 1.2 mg/dL    Bilirubin, Direct 0.2 0.1 - 0.5 mg/dL   Indirect Bilirubin 0.5 0.3 - 0.9 mg/dL    Comment: Performed at [facility] [address], [place], [place] [address]   No results found.  Pending Labs Unresulted Labs (From admission, onward)   Start     Ordered   04/17/18 0500  Creatinine, serum  (enoxaparin (LOVENOX)    CrCl >/= 30 ml/min)  Weekly,   R    Comments:  while on enoxaparin therapy    04/10/18 2100   04/11/18 0500  HIV antibody (Routine Testing)  Tomorrow morning,   R     04/10/18 2100   04/10/18 1952  Urine Culture  Once,   STAT     04/10/18 1951      Vitals/Pain Today's Vitals   04/10/18 2232 04/10/18 2233 04/11/18 0320 04/11/18 0331  BP: 112/60  126/83   Pulse: 88  91   Resp: 18     Temp: 100.2 F (37.9 C)     TempSrc: Oral     SpO2: 96%  99%   Weight:      Height:      PainSc: 0-No pain 0-No pain  0-No pain    Isolation Precautions No active isolations  Medications Medications  albuterol (PROVENTIL) (2.5 MG/3ML) 0.083% nebulizer solution 3 mL (has no administration in time range)  atomoxetine (STRATTERA) capsule 80 mg (0 mg Oral Hold 04/10/18 2159)  buPROPion (WELLBUTRIN XL) 24 hr tablet 150 mg (0 mg Oral Hold 04/10/18 2159)  clonazePAM (KLONOPIN) tablet 1 mg (1 mg Oral Given 04/10/18 2228)  FLUoxetine (PROZAC) capsule 80 mg (0 mg Oral Hold 04/10/18 2201)  irbesartan (AVAPRO) tablet 300 mg (0 mg Oral Hold 04/10/18 2201)  famotidine (PEPCID) tablet 20 mg (20 mg Oral Given 04/10/18 2228)  fesoterodine (TOVIAZ) tablet 4 mg (0 mg Oral Hold 04/10/18 2201)  topiramate (TOPAMAX) tablet 300 mg (0 mg Oral Hold 04/10/18 2202)  ziprasidone (GEODON) capsule 60 mg (60 mg Oral Given 04/10/18 2228)  zolpidem (AMBIEN) tablet 5 mg (5 mg Oral Given 04/10/18 2228)  acetaminophen (TYLENOL) tablet 650 mg (has no administration in time range)    Or  acetaminophen (TYLENOL) suppository 650 mg (has no administration in time range)  ondansetron (ZOFRAN) tablet 4 mg (has no  administration in time range)    Or  ondansetron (ZOFRAN) injection 4 mg (has no administration in time range)  enoxaparin (LOVENOX) injection 40 mg (40 mg Subcutaneous Given 04/10/18 2241)  0.9 %  sodium chloride infusion ( Intravenous Stopped 04/11/18 0326)  cefTRIAXone (ROCEPHIN) 2 g in sodium chloride 0.9 % 100 mL IVPB (has no administration in time range)  cefTRIAXone (ROCEPHIN) 1 g in sodium chloride 0.9 % 100 mL IVPB (0 g Intravenous Hold 04/10/18 2200)  cefTRIAXone (ROCEPHIN) 1 g in sodium chloride 0.9 % 100 mL IVPB (0 g Intravenous Stopped 04/10/18 2051)  sodium chloride 0.9 % bolus 500 mL (0 mLs Intravenous Stopped 04/10/18 2121)  acetaminophen (TYLENOL) tablet 1,000 mg (1,000 mg Oral Given 04/10/18 2050)    Mobility walks

## 2018-04-11 NOTE — Progress Notes (Signed)
RT spoke to patient about wearing CPAP and the patient stated that she doesn't wear it all the time at home and don't want to wear it at this time. RT advised if she changes her mind to advise the RN and one can be supplied.    04/11/18 2217  BiPAP/CPAP/SIPAP  BiPAP/CPAP/SIPAP Pt Type Adult (pt refused)  Oxygen Percent 21 %  BiPAP/CPAP/SIPAP CPAP (pt refused)

## 2018-04-11 NOTE — Progress Notes (Signed)
  PROGRESS NOTE  Shannon Sharp ZOX:[id number] DOB: 03/02/1966 DOA: 04/10/2018 PCP: Georgann HousekeeperHusain, Karrar, MD  Brief Narrative: 52-year-old woman recently seen in the emergency department for UTI, June 10, was recalled for positive blood culture.  Proteus UTI, bacteremia  Assessment/Plan Proteus bacteremia, UTI --WBC trending down, hemodynamics stable, clinically improving --Follow-up final blood culture data.  In the meantime continue ceftriaxone.  Chronic kidney disease stage III, appears to be at baseline.  Essential hypertension.  Stable.  Bipolar disorder, stable.  Continue Geodon, Prozac, Wellbutrin, clonidine.    DVT prophylaxis: enoxaparin Code Status: full Family Communication:  Disposition Plan: home    Brendia Sacksaniel Goodrich, MD  [facility] Direct contact: [phone] --Via amion app OR  --[email or web address]; password [email or web address]  7PM-7AM contact night coverage as above 04/11/2018, 5:00 PM  LOS: 1 day   Consultants:    Procedures:    Antimicrobials:  Ceftriaxone 6/11 >>  Interval history/Subjective: Feels much better today.  Objective: Vitals:  Vitals:   04/11/18 0635 04/11/18 1410  BP: (!) 139/91 135/61  Pulse: 96 94  Resp: 17   Temp: 99.7 F (37.6 C) 99.8 F (37.7 C)  SpO2: 94% 100%    Exam:  Constitutional:  . Appears calm and comfortable .  Respiratory:  . CTA bilaterally, no w/r/r.  . Respiratory effort normal. No retractions or accessory muscle use Cardiovascular:  . RRR, no m/r/g . No LE extremity edema   Psychiatric:  . Mental status o Mood, affect appropriate  I have personally reviewed the following:   Labs:  Creatinine appears to be at baseline.  Remainder CMP unremarkable.  WBC trending down, 14.2.  Hemoglobin stable 11.0.  Urine culture noted.  Blood cultures pending.  Scheduled Meds: . atomoxetine  80 mg Oral QPC supper  . buPROPion  150 mg Oral Daily  . clonazePAM  1 mg Oral BID  . enoxaparin (LOVENOX) injection  40  mg Subcutaneous QHS  . famotidine  20 mg Oral BID  . fesoterodine  4 mg Oral Daily  . FLUoxetine  80 mg Oral Daily  . irbesartan  300 mg Oral Daily  . topiramate  300 mg Oral QPC supper  . ziprasidone  60 mg Oral QHS  . zolpidem  5 mg Oral QHS   Continuous Infusions: . sodium chloride 75 mL/hr at 04/11/18 1314  . cefTRIAXone (ROCEPHIN)  IV Stopped (04/11/18 1416)    Principal Problem:   Bacteremia Active Problems:   Bipolar 2 disorder ([facility])   Acute lower UTI   LOS: 1 day

## 2018-04-12 ENCOUNTER — Telehealth: Payer: Self-pay | Admitting: Emergency Medicine

## 2018-04-12 LAB — CBC
HEMATOCRIT: 31.7 % — AB (ref 36.0–46.0)
HEMOGLOBIN: 9.5 g/dL — AB (ref 12.0–15.0)
MCH: 23.5 pg — ABNORMAL LOW (ref 26.0–34.0)
MCHC: 30 g/dL (ref 30.0–36.0)
MCV: 78.3 fL (ref 78.0–100.0)
Platelets: 284 10*3/uL (ref 150–400)
RBC: 4.05 MIL/uL (ref 3.87–5.11)
RDW: 14.5 % (ref 11.5–15.5)
WBC: 10.5 10*3/uL (ref 4.0–10.5)

## 2018-04-12 LAB — BASIC METABOLIC PANEL
ANION GAP: 9 (ref 5–15)
BUN: 10 mg/dL (ref 6–20)
CALCIUM: 9.9 mg/dL (ref 8.9–10.3)
CO2: 22 mmol/L (ref 22–32)
Chloride: 107 mmol/L (ref 101–111)
Creatinine, Ser: 1.06 mg/dL — ABNORMAL HIGH (ref 0.44–1.00)
GFR calc Af Amer: >60 mL/min (ref >60)
GFR calc non Af Amer: 59 mL/min — ABNORMAL LOW (ref >60)
GLUCOSE: 106 mg/dL — AB (ref 65–99)
Potassium: 3.7 mmol/L (ref 3.5–5.1)
Sodium: 138 mmol/L (ref 135–145)

## 2018-04-12 MED ORDER — POLYETHYLENE GLYCOL 3350 17 G PO PACK: 17.0000 g | PACK | Freq: Two times a day (BID) | ORAL | Status: DC

## 2018-04-12 MED ORDER — CEFDINIR 300 MG PO CAPS: 300.0000 mg | ORAL_CAPSULE | Freq: Two times a day (BID) | ORAL | 0 refills | Status: DC

## 2018-04-12 MED ORDER — CEFDINIR 300 MG PO CAPS
300.0000 mg | ORAL_CAPSULE | Freq: Two times a day (BID) | ORAL | Status: DC
  Administered 2018-04-12: 300 mg via ORAL
  Filled 2018-04-12 (×2): qty 1

## 2018-04-12 MED ORDER — BISACODYL 10 MG RE SUPP: 10.0000 mg | Freq: Every day | RECTAL | Status: DC | PRN

## 2018-04-12 MED ORDER — SENNA 8.6 MG PO TABS: 1.0000 | ORAL_TABLET | Freq: Every day | ORAL | Status: DC

## 2018-04-12 NOTE — Telephone Encounter (Signed)
Post ED Visit - Positive Culture Follow-up  Culture report reviewed by antimicrobial stewardship pharmacist:  []  Shannon Sharp, Pharm.D. []  Shannon Sharp, Pharm.D., BCPS AQ-ID []  Shannon Sharp, Pharm.D., BCPS []  Shannon Sharp, Pharm.D., BCPS []  Shannon Sharp, [address]Ph[address]D., BCPS, AAHIVP []  Shannon Sharp, Pharm.D., BCPS, AAHIVP []  Shannon Sharp, PharmD, BCPS []  Shannon Sharp, PharmD []  Shannon Sharp, PharmD, BCPS Shannon Sharp PharmD  Positive urine culture Patient is admitted to hospital, no further patient follow-up is required at this time.  Shannon Sharp, Shannon Sharp 04/12/2018, 4:32 PM

## 2018-04-12 NOTE — Progress Notes (Signed)
Discharged home today with daughter driving patient home. Personal belongings,discharged instructions given to patient. Prescription called in to the pharmacy of choice by MD, patient advised to pick up the medicine. Verbalized understanding of instructions

## 2018-04-12 NOTE — Discharge Summary (Addendum)
Physician Discharge Summary  Shannon Sharp A Brilliant ZOX:[id number] DOB: 09/29/1966 DOA: 04/10/2018  PCP: Georgann HousekeeperHusain, Karrar, MD  Admit date: 04/10/2018 Discharge date: 04/12/2018  Recommendations for Outpatient Follow-up:  1. Resolution of UTI, bacteremia  Follow-up Information    Georgann HousekeeperHusain, Karrar, MD. Schedule an appointment as soon as possible for a visit in 1 week(s).   Specialty:  Internal Medicine Contact information: [address] [place] [id number] [phone]            Discharge Diagnoses:  1. Proteus bacteremia, UTI 2. Chronic kidney disease stage III 3. Essential hypertension 4. Bipolar disorder, stable 5. Morbid obesity Body mass index is 45.19 kg/m.  Discharge Condition: improved Disposition: home  Diet recommendation: regular  Filed Weights   04/10/18 1224 04/11/18 0635  Weight: 127 kg (280 lb) 127 kg (280 lb)    History of present illness:  52 year old woman recently seen in the emergency department for UTI, June 10, was recalled for positive blood culture.  Proteus UTI, bacteremia  Hospital Course:  Patient was treated for Proteus bacteremia secondary to UTI with rapid clinical improvement with empiric ceftriaxone.  Final culture data available and patient was transitioned to oral antibiotics.  Afebrile 24 hours.  WBC has normalized.  Renal function stable.  Hospitalization was uncomplicated.  Proteus bacteremia, UTI --WBC trending down, hemodynamics stable, clinically improving --Follow-up final blood culture data.  In the meantime continue ceftriaxone.  Chronic kidney disease stage III, appears to be at baseline.  Essential hypertension.  Stable.  Bipolar disorder, stable.  Continue Geodon, Prozac, Wellbutrin, clonidine.  Antimicrobials:  Ceftriaxone 6/11 >> 6/12  cefdinir 6/13 >>  Today's assessment: S: Feels much better today.  No pain.  Eating well.  No complaints. O: Vitals:  Vitals:   04/12/18 0521 04/12/18 1225    BP: 122/68 117/75  Pulse: 79 79  Resp: 18   Temp: 98.7 F (37.1 C) 98.5 F (36.9 C)  SpO2: 94% 100%    Constitutional:  . Appears calm and comfortable Respiratory:  . CTA bilaterally, no w/r/r.  . Respiratory effort normal.  Cardiovascular:  . RRR, no m/r/g . No LE extremity edema   Psychiatric:  . judgement and insight appear normal . Mental status o Mood, affect appropriate  Urine blood culture data reviewed. Creatinine now near normal, 1.06. Hemoglobin stable 9.5.  WBC has normalized.  Discharge Instructions  Discharge Instructions    Diet - low sodium heart healthy   Complete by:  As directed    Discharge instructions   Complete by:  As directed    Call your physician or seek immediate medical attention for fever, pain, confusion, weakness or worsening of condition.   Increase activity slowly   Complete by:  As directed      Allergies as of 04/12/2018      Reactions   Penicillins Itching, Other (See Comments)   Has patient had a PCN reaction causing immediate rash, facial/tongue/throat swelling, SOB or lightheadedness with hypotension: no Has patient had a PCN reaction causing severe rash involving mucus membranes or skin necrosis: no Has patient had a PCN reaction that required hospitalization: no Has patient had a PCN reaction occurring within the last 10 years: no If all of the above answers are "NO", then may proceed with Cephalosporin use.   Sulfa Antibiotics Swelling      Medication List    TAKE these medications   albuterol 108 (90 Base) MCG/ACT inhaler Commonly known as:  PROVENTIL HFA;VENTOLIN HFA Inhale 2  puffs into the lungs every 4 (four) hours as needed for wheezing or shortness of breath.   atomoxetine 80 MG capsule Commonly known as:  STRATTERA Take 80 mg by mouth daily after supper.   buPROPion 150 MG 24 hr tablet Commonly known as:  WELLBUTRIN XL Take 150 mg by mouth daily.   cefdinir 300 MG capsule Commonly known as:   OMNICEF Take 1 capsule (300 mg total) by mouth every 12 (twelve) hours.   clonazePAM 1 MG tablet Commonly known as:  KLONOPIN Take 1 tablet by mouth 2 (two) times daily.   FLUoxetine 40 MG capsule Commonly known as:  PROZAC Take 80 mg by mouth daily.   hydrocortisone 2.5 % cream Apply 1 application topically as needed (For eczema.).   ibuprofen 600 MG tablet Commonly known as:  ADVIL,MOTRIN Take 1 tablet (600 mg total) by mouth every 6 (six) hours as needed.   irbesartan 300 MG tablet Commonly known as:  AVAPRO Take 300 mg by mouth daily. For 30 days   meloxicam 15 MG tablet Commonly known as:  MOBIC TAKE 1 TABLET BY MOUTH EVERY DAY   ondansetron 8 MG tablet Commonly known as:  ZOFRAN Take 8 mg by mouth 2 (two) times daily.   ranitidine 150 MG tablet Commonly known as:  ZANTAC Take 150 mg by mouth 2 (two) times daily.   tolterodine 4 MG 24 hr capsule Commonly known as:  DETROL LA Take 4 mg by mouth daily.   topiramate 100 MG tablet Commonly known as:  TOPAMAX Take 300 mg by mouth daily after supper.   triamcinolone 0.025 % cream Commonly known as:  KENALOG Apply 1 application topically 2 (two) times daily. What changed:    when to take this  reasons to take this   ziprasidone 60 MG capsule Commonly known as:  GEODON Take 60 mg by mouth at bedtime.   zolpidem 10 MG tablet Commonly known as:  AMBIEN Take 10 mg by mouth at bedtime.      Allergies  Allergen Reactions  . Penicillins Itching and Other (See Comments)    Has patient had a PCN reaction causing immediate rash, facial/tongue/throat swelling, SOB or lightheadedness with hypotension: no Has patient had a PCN reaction causing severe rash involving mucus membranes or skin necrosis: no Has patient had a PCN reaction that required hospitalization: no Has patient had a PCN reaction occurring within the last 10 years: no If all of the above answers are "NO", then may proceed with Cephalosporin use.    . Sulfa Antibiotics Swelling    The results of significant diagnostics from this hospitalization (including imaging, microbiology, ancillary and laboratory) are listed below for reference.    Significant Diagnostic Studies: No results found.  Microbiology: Recent Results (from the past 240 hour(s))  Blood culture (routine x 2)     Status: Abnormal   Collection Time: 04/09/18  4:32 PM  Result Value Ref Range Status   Specimen Description BLOOD LEFT HAND  Final   Special Requests   Final    BOTTLES DRAWN AEROBIC AND ANAEROBIC Blood Culture adequate volume   Culture  Setup Time   Final    GRAM NEGATIVE RODS AEROBIC BOTTLE ONLY CRITICAL RESULT CALLED TO, READ BACK BY AND VERIFIED WITH: Paulita Cradle RN 10:45 04/10/18 ([email or web address])    Culture PROTEUS MIRABILIS (A)  Final   Report Status 04/12/2018 FINAL  Final   Organism ID, Bacteria PROTEUS MIRABILIS  Final      Susceptibility  Proteus mirabilis - MIC*    AMPICILLIN <=2 SENSITIVE Sensitive     CEFAZOLIN <=4 SENSITIVE Sensitive     CEFEPIME <=1 SENSITIVE Sensitive     CEFTAZIDIME <=1 SENSITIVE Sensitive     CEFTRIAXONE <=1 SENSITIVE Sensitive     CIPROFLOXACIN <=0.25 SENSITIVE Sensitive     GENTAMICIN <=1 SENSITIVE Sensitive     IMIPENEM 2 SENSITIVE Sensitive     TRIMETH/SULFA <=20 SENSITIVE Sensitive     AMPICILLIN/SULBACTAM <=2 SENSITIVE Sensitive     PIP/TAZO <=4 SENSITIVE Sensitive     * PROTEUS MIRABILIS  Blood Culture ID Panel (Reflexed)     Status: Abnormal   Collection Time: 04/09/18  4:32 PM  Result Value Ref Range Status   Enterococcus species NOT DETECTED NOT DETECTED Final   Listeria monocytogenes NOT DETECTED NOT DETECTED Final   Staphylococcus species NOT DETECTED NOT DETECTED Final   Staphylococcus aureus NOT DETECTED NOT DETECTED Final   Streptococcus species NOT DETECTED NOT DETECTED Final   Streptococcus agalactiae NOT DETECTED NOT DETECTED Final   Streptococcus pneumoniae NOT DETECTED NOT DETECTED Final    Streptococcus pyogenes NOT DETECTED NOT DETECTED Final   Acinetobacter baumannii NOT DETECTED NOT DETECTED Final   Enterobacteriaceae species DETECTED (A) NOT DETECTED Final    Comment: Enterobacteriaceae represent a large family of gram-negative bacteria, not a single organism. CRITICAL RESULT CALLED TO, READ BACK BY AND VERIFIED WITH: Paulita Cradle RN 10:45 04/10/18 ([email or web address])    Enterobacter cloacae complex NOT DETECTED NOT DETECTED Final   Escherichia coli NOT DETECTED NOT DETECTED Final   Klebsiella oxytoca NOT DETECTED NOT DETECTED Final   Klebsiella pneumoniae NOT DETECTED NOT DETECTED Final   Proteus species DETECTED (A) NOT DETECTED Final    Comment: CRITICAL RESULT CALLED TO, READ BACK BY AND VERIFIED WITH: Paulita Cradle RN 10:45 04/10/18 ([email or web address])    Serratia marcescens NOT DETECTED NOT DETECTED Final   Carbapenem resistance NOT DETECTED NOT DETECTED Final   Haemophilus influenzae NOT DETECTED NOT DETECTED Final   Neisseria meningitidis NOT DETECTED NOT DETECTED Final   Pseudomonas aeruginosa NOT DETECTED NOT DETECTED Final   Candida albicans NOT DETECTED NOT DETECTED Final   Candida glabrata NOT DETECTED NOT DETECTED Final   Candida krusei NOT DETECTED NOT DETECTED Final   Candida parapsilosis NOT DETECTED NOT DETECTED Final   Candida tropicalis NOT DETECTED NOT DETECTED Final    Comment: Performed at [facility], [address] [place], [place] [address]  Urine culture     Status: Abnormal   Collection Time: 04/09/18  5:34 PM  Result Value Ref Range Status   Specimen Description URINE, CLEAN CATCH  Final   Special Requests   Final    NONE Performed at [facility], [address] [place], [place] [address]    Culture >=100,000 COLONIES/mL PROTEUS MIRABILIS (A)  Final   Report Status 04/11/2018 FINAL  Final   Organism ID, Bacteria PROTEUS MIRABILIS (A)  Final      Susceptibility   Proteus mirabilis - MIC*    AMPICILLIN <=2 SENSITIVE Sensitive     CEFAZOLIN  <=4 SENSITIVE Sensitive     CEFTRIAXONE <=1 SENSITIVE Sensitive     CIPROFLOXACIN <=0.25 SENSITIVE Sensitive     GENTAMICIN <=1 SENSITIVE Sensitive     IMIPENEM 2 SENSITIVE Sensitive     NITROFURANTOIN 128 RESISTANT Resistant     TRIMETH/SULFA <=20 SENSITIVE Sensitive     AMPICILLIN/SULBACTAM <=2 SENSITIVE Sensitive     PIP/TAZO <=4 SENSITIVE Sensitive     * >=  100,000 COLONIES/mL PROTEUS MIRABILIS  Urine Culture     Status: Abnormal (Preliminary result)   Collection Time: 04/10/18  8:12 PM  Result Value Ref Range Status   Specimen Description URINE, CLEAN CATCH  Final   Special Requests   Final    NONE Performed at [facility], [address] [place], [place] [address]    Culture >=100,000 COLONIES/mL PROTEUS MIRABILIS (A)  Final   Report Status PENDING  Incomplete     Labs: Basic Metabolic Panel: Recent Labs  Lab 04/09/18 1612 04/10/18 1230 04/11/18 0330 04/12/18 0646  NA 133* 136 137 138  K 3.9 4.1 4.5 3.7  CL 104 105 105 107  CO2 18* 20* 19* 22  GLUCOSE 111* 101* 84 106*  BUN 14 13 13 10   CREATININE 1.73* 1.51* 1.40* 1.06*  CALCIUM 9.8 9.9 9.9 9.9   Liver Function Tests: Recent Labs  Lab 04/10/18 1230 04/11/18 0330  AST 35 29  ALT 33 29  ALKPHOS 66 62  BILITOT 1.0 0.7  PROT 7.7 7.6  ALBUMIN 3.1* 3.0*   CBC: Recent Labs  Lab 04/09/18 1613 04/10/18 1230 04/11/18 0330 04/12/18 0646  WBC 20.3* 17.9* 14.2* 10.5  NEUTROABS 16.6* 14.1*  --   --   HGB 11.1* 11.0* 11.0* 9.5*  HCT 36.1 36.0 36.8 31.7*  MCV 77.1* 77.1* 79.7 78.3  PLT 284 292 220 284    Principal Problem:   Bacteremia Active Problems:   Bipolar 2 disorder (HCC)   Acute lower UTI   CKD (chronic kidney disease), stage III (HCC)   Time coordinating discharge: 25 minutes  Signed:  Brendia Sacks, MD [facility] 04/12/2018, 4:55 PM

## 2018-04-13 LAB — URINE CULTURE

## 2018-04-14 ENCOUNTER — Telehealth: Payer: Self-pay

## 2018-04-14 NOTE — Telephone Encounter (Signed)
+   BC treated during adm and D/c per Joette Catchingachel Rumbarger Pharm D

## 2018-04-15 LAB — CULTURE, BLOOD (ROUTINE X 2): SPECIAL REQUESTS: ADEQUATE

## 2018-04-16 ENCOUNTER — Telehealth: Payer: Self-pay | Admitting: Emergency Medicine

## 2018-04-16 NOTE — Telephone Encounter (Deleted)
Post ED Visit - Positive Culture Follow-up: Successful Patient Follow-Up  Culture assessed and recommendations reviewed by: []  Enzo BiNathan Batchelder, Pharm.D. []  Celedonio MiyamotoJeremy Frens, Pharm.D., BCPS AQ-ID []  Garvin FilaMike Maccia, Pharm.D., BCPS []  Georgina PillionElizabeth Martin, Pharm.D., BCPS []  [place] RidgeMinh Pham, Vermont[place]arm.D., BCPS, AAHIVP []  Estella HuskMichelle Turner, Pharm.D., BCPS, AAHIVP []  Lysle Pearlachel Rumbarger, PharmD, BCPS []  Sherlynn CarbonAustin Lucas, PharmD []  Pollyann SamplesAndy Johnston, PharmD, BCPS  Positive *** culture  []  Patient discharged without antimicrobial prescription and treatment is now indicated []  Organism is resistant to prescribed ED discharge antimicrobial []  Patient with positive blood cultures  Changes discussed with ED provider: *** New antibiotic prescription *** Called to ***  Contacted patient, date ***, time ***   Berle MullMiller, Riki Gehring 04/16/2018, 1:03 PM

## 2018-04-16 NOTE — Telephone Encounter (Signed)
   Berle MullMiller, Dimple Bastyr 04/16/2018, 1:07 PM  Post ED Visit - Positive Culture Follow-up  Culture report reviewed by antimicrobial stewardship pharmacist:  []  Enzo BiNathan Batchelder, Pharm.D. []  Celedonio MiyamotoJeremy Frens, Pharm.D., BCPS AQ-ID []  Garvin FilaMike Maccia, Pharm.D., BCPS []  Georgina PillionElizabeth Martin, [address] BoulevardPharm.D., BCPS []  [place] Point[place]h Pham, [place]Pharm.D., BCPS, AAHIVP []  Estella HuskMichelle Turner, Pharm.D., BCPS, AAHIVP []  Lysle Pearlachel Rumbarger, PharmD, BCPS []  Sherlynn CarbonAustin Lucas, PharmD []  Pollyann SamplesAndy Johnston, PharmD, BCPS [facility] Hospital[facility] Badgett PharmD  Positive blood culture Already called and treated according to culture results,no further patient follow-up is required at this time.  Berle MullMiller, Gerline Ratto 04/16/2018, 1:07 PM

## 2018-04-16 NOTE — Telephone Encounter (Deleted)
Post ED Visit - Positive Culture Follow-up  Culture report reviewed by antimicrobial stewardship pharmacist:  []  Enzo BiNathan Batchelder, Pharm.D. []  Celedonio MiyamotoJeremy Frens, Pharm.D., BCPS AQ-ID []  Garvin FilaMike Maccia, Pharm.D., BCPS []  Georgina PillionElizabeth Martin, Pharm.D., BCPS []  [place]h Pham, [address] BoulevardPharm.D., BCPS, AAHIVP []  Estella HuskMichelle Turner, Pharm.D., BCPS, AAHIVP []  Lysle Pearlachel Rumbarger, PharmD, BCPS []  Sherlynn CarbonAustin Lucas, PharmD []  Pollyann SamplesAndy Johnston, PharmD, BCPS  Positive *** culture Treated with ***, organism sensitive to the same and no further patient follow-up is required at this time.  Berle MullMiller, Cobi Aldape 04/16/2018, 1:07 PM  Post ED Visit - Positive Culture Follow-up  Culture report reviewed by antimicrobial stewardship pharmacist:  []  Enzo BiNathan Batchelder, Pharm.D. []  Celedonio MiyamotoJeremy Frens, Pharm.D., BCPS AQ-ID []  Garvin FilaMike Maccia, Pharm.D., BCPS []  Georgina PillionElizabeth Martin, [address]Pharm.D., BCPS []  [place]Minh Pham, [place]Pharm.D., BCPS, AAHIVP []  Estella HuskMichelle Turner, Pharm.D., BCPS, AAHIVP []  Lysle Pearlachel Rumbarger, PharmD, BCPS []  Sherlynn CarbonAustin Lucas, PharmD []  Pollyann SamplesAndy Johnston, PharmD, BCPS  Positive *** culture Treated with ***, organism sensitive to the same and no further patient follow-up is required at this time.  Berle MullMiller, Owin Vignola 04/16/2018, 1:07 PM

## 2018-05-10 ENCOUNTER — Other Ambulatory Visit: Payer: Self-pay | Admitting: Nurse Practitioner

## 2018-05-10 ENCOUNTER — Other Ambulatory Visit (HOSPITAL_COMMUNITY)
Admission: RE | Admit: 2018-05-10 | Discharge: 2018-05-10 | Disposition: A | Discharge status: Home / Self Care | Payer: Medicare Other | Source: Ambulatory Visit | Attending: Nurse Practitioner | Admitting: Nurse Practitioner

## 2018-05-10 DIAGNOSIS — Z01419 Encounter for gynecological examination (general) (routine) without abnormal findings: Secondary | ICD-10-CM | POA: Diagnosis present

## 2018-05-11 LAB — CYTOLOGY - PAP
DIAGNOSIS: NEGATIVE
HPV: DETECTED — AB

## 2018-05-29 DIAGNOSIS — M179 Osteoarthritis of knee, unspecified: Secondary | ICD-10-CM | POA: Insufficient documentation

## 2018-05-30 ENCOUNTER — Ambulatory Visit: Payer: Self-pay | Admitting: Nurse Practitioner

## 2018-05-30 ENCOUNTER — Telehealth: Payer: Self-pay | Admitting: *Deleted

## 2018-05-30 NOTE — Telephone Encounter (Signed)
Patient was no show for follow up with NP today.  
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
## 2018-05-31 ENCOUNTER — Encounter: Payer: Self-pay | Admitting: Nurse Practitioner

## 2018-06-04 ENCOUNTER — Encounter: Payer: Self-pay | Admitting: Gastroenterology

## 2018-06-04 ENCOUNTER — Ambulatory Visit (INDEPENDENT_AMBULATORY_CARE_PROVIDER_SITE_OTHER): Payer: Medicare Other | Admitting: Gastroenterology

## 2018-06-04 VITALS — BP 124/80 | HR 78 | Ht 66.0 in | Wt 279.4 lb

## 2018-06-04 DIAGNOSIS — K224 Dyskinesia of esophagus: Secondary | ICD-10-CM

## 2018-06-04 DIAGNOSIS — R197 Diarrhea, unspecified: Secondary | ICD-10-CM

## 2018-06-04 DIAGNOSIS — R159 Full incontinence of feces: Secondary | ICD-10-CM | POA: Diagnosis not present

## 2018-06-04 NOTE — Patient Instructions (Signed)
Take Benefiber 1 teaspoon three times a day with meals  Follow up as needed     Kegel Exercises Kegel exercises help strengthen the muscles that support the rectum, vagina, small intestine, bladder, and uterus. Doing Kegel exercises can help:  Improve bladder and bowel control.  Improve sexual response.  Reduce problems and discomfort during pregnancy.  Kegel exercises involve squeezing your pelvic floor muscles, which are the same muscles you squeeze when you try to stop the flow of urine. The exercises can be done while sitting, standing, or lying down, but it is best to vary your position. Phase 1 exercises 1. Squeeze your pelvic floor muscles tight. You should feel a tight lift in your rectal area. If you are a female, you should also feel a tightness in your vaginal area. Keep your stomach, buttocks, and legs relaxed. 2. Hold the muscles tight for up to 10 seconds. 3. Relax your muscles. Repeat this exercise 50 times a day or as many times as told by your health care provider. Continue to do this exercise for at least 4-6 weeks or for as long as told by your health care provider. This information is not intended to replace advice given to you by your health care provider. Make sure you discuss any questions you have with your health care provider. Document Released: 10/03/2012 Document Revised: 06/11/2016 Document Reviewed: 09/06/2015 Elsevier Interactive Patient Education  2018 [employer]  Thank you for choosing [facility]  Kavitha Nandigam,MD

## 2018-06-04 NOTE — Progress Notes (Signed)
Shannon Sharp    [id number]    1966-01-11  Primary Care Physician:Husain, Jerelyn Scott, MD  Referring Physician: Georgann Housekeeper, MD [address] [place], [place] [address]  Chief complaint: Fecal incontinence, diarrhea  HPI:  52 year old female with history of obesity, chronic GERD here for follow-up visit with complaints of diarrhea and associated fecal incontinence last month. She was recently hospitalized in June 2019 with UTI and bacteremia and was treated with a course of antibiotics.  Diarrhea started after she completed the antibiotics and lasted for a week.  She was having incontinence with liquid stool.  No other members in the household were affected.  Denies any black stool or blood in stool.  Denies any change in appetite or weight loss.  Her bowel habits are back to baseline and no longer having diarrhea.  Colonoscopy January 2019 internal hemorrhoids otherwise normal exam EGD January 2019 unremarkable Esophageal manometry March 2019 showed ineffective esophageal motility with weak peristalsis and frequent failed peristalsis 24-hour pH study showed normal esophageal acid exposure with no evidence of increased gastroesophageal acid reflux   Outpatient Encounter Medications as of 06/04/2018  Medication Sig  . albuterol (PROVENTIL HFA;VENTOLIN HFA) 108 (90 Base) MCG/ACT inhaler Inhale 2 puffs into the lungs every 4 (four) hours as needed for wheezing or shortness of breath.  . clonazePAM (KLONOPIN) 1 MG tablet Take 1 tablet by mouth 2 (two) times daily.   . hydrocortisone 2.5 % cream Apply 1 application topically as needed (For eczema.).   Marland Kitchen irbesartan (AVAPRO) 300 MG tablet Take 300 mg by mouth daily. For 30 days  . ondansetron (ZOFRAN) 8 MG tablet Take 8 mg by mouth 2 (two) times daily.  . ranitidine (ZANTAC) 150 MG tablet Take 150 mg by mouth 2 (two) times daily.  Marland Kitchen tolterodine (DETROL LA) 4 MG 24 hr capsule Take 4 mg by mouth daily.  Marland Kitchen  topiramate (TOPAMAX) 100 MG tablet Take 300 mg by mouth daily after supper.   . triamcinolone (KENALOG) 0.025 % cream Apply 1 application topically 2 (two) times daily. (Patient taking differently: Apply 1 application topically 2 (two) times daily as needed (For eczema.). )  . ziprasidone (GEODON) 60 MG capsule Take 60 mg by mouth at bedtime.  Marland Kitchen zolpidem (AMBIEN) 10 MG tablet Take 10 mg by mouth at bedtime.   . [DISCONTINUED] atomoxetine (STRATTERA) 80 MG capsule Take 80 mg by mouth daily after supper.   . [DISCONTINUED] buPROPion (WELLBUTRIN XL) 150 MG 24 hr tablet Take 150 mg by mouth daily.   . [DISCONTINUED] cefdinir (OMNICEF) 300 MG capsule Take 1 capsule (300 mg total) by mouth every 12 (twelve) hours.  . [DISCONTINUED] FLUoxetine (PROZAC) 40 MG capsule Take 80 mg by mouth daily.  . [DISCONTINUED] ibuprofen (ADVIL,MOTRIN) 600 MG tablet Take 1 tablet (600 mg total) by mouth every 6 (six) hours as needed.  . [DISCONTINUED] meloxicam (MOBIC) 15 MG tablet TAKE 1 TABLET BY MOUTH EVERY DAY   No facility-administered encounter medications on file as of 06/04/2018.     Allergies as of 06/04/2018 - Review Complete 06/04/2018  Allergen Reaction Noted  . Penicillins Itching and Other (See Comments) 02/22/2012  . Sulfa antibiotics Swelling 02/22/2012    Past Medical History:  Diagnosis Date  . Allergy   . Anxiety   . Arthritis   . Bipolar 2 disorder (HCC)   . Depression   . GERD (gastroesophageal reflux disease)   . HTN (hypertension)   .  Morbid obesity (HCC)   . Seizure (HCC) 02/2018  . Urinary incontinence   . UTI (urinary tract infection) 04/10/2018    Past Surgical History:  Procedure Laterality Date  . 24 HOUR PH STUDY N/A 01/08/2018   Procedure: 24 HOUR PH STUDY;  Surgeon: Napoleon Form, MD;  Location: [facility];  Service: Endoscopy;  Laterality: N/A;  . COLONOSCOPY    . ESOPHAGEAL MANOMETRY N/A 01/08/2018   Procedure: ESOPHAGEAL MANOMETRY (EM);  Surgeon: Napoleon Form, MD;  Location: [facility];  Service: Endoscopy;  Laterality: N/A;  . ESOPHAGOGASTRODUODENOSCOPY ENDOSCOPY      Family History  Problem Relation Age of Onset  . Hypertension Mother   . Hypertension Father   . Diabetes Paternal Aunt   . Diabetes Paternal Uncle   . Colon cancer Neg Hx   . Stomach cancer Neg Hx   . Pancreatic cancer Neg Hx     Social History   Socioeconomic History  . Marital status: Single    Spouse name: Not on file  . Number of children: 2  . Years of education: 91  . Highest education level: Not on file  Occupational History  . Occupation: Disability  Social Needs  . Financial resource strain: Not on file  . Food insecurity:    Worry: Not on file    Inability: Not on file  . Transportation needs:    Medical: Not on file    Non-medical: Not on file  Tobacco Use  . Smoking status: Never Smoker  . Smokeless tobacco: Never Used  Substance and Sexual Activity  . Alcohol use: No  . Drug use: No  . Sexual activity: Yes    Birth control/protection: IUD  Lifestyle  . Physical activity:    Days per week: Not on file    Minutes per session: Not on file  . Stress: Not on file  Relationships  . Social connections:    Talks on phone: Not on file    Gets together: Not on file    Attends religious service: Not on file    Active member of club or organization: Not on file    Attends meetings of clubs or organizations: Not on file    Relationship status: Not on file  . Intimate partner violence:    Fear of current or ex partner: Not on file    Emotionally abused: Not on file    Physically abused: Not on file    Forced sexual activity: Not on file  Other Topics Concern  . Not on file  Social History Narrative   Fun/Hobby: Attend classes at the [employer]; working on going to the gym.       Review of systems: Review of Systems  Constitutional: Negative for fever and chills.  HENT: Negative.   Eyes: Negative for blurred  vision.  Respiratory: Negative for cough, shortness of breath and wheezing.   Cardiovascular: Negative for chest pain and palpitations.  Gastrointestinal: as per HPI Genitourinary: Negative for dysuria, urgency, and hematuria.  Positive for frequent urination Musculoskeletal: Positive for myalgias, back pain and joint pain.  Skin: Negative for itching and rash.  Neurological: Negative for dizziness, tremors, focal weakness, seizures and loss of consciousness.  Endo/Heme/Allergies: Positive for seasonal allergies.  Psychiatric/Behavioral: Negative for suicidal ideas and hallucinations.  Positive for depression, anxiety and insomnia All other systems reviewed and are negative.   Physical Exam: Vitals:   06/04/18 0901  BP: 124/80  Pulse: 78   Body mass  index is 45.09 kg/m. Gen:      No acute distress HEENT:  EOMI, sclera anicteric Neck:     No masses; no thyromegaly Lungs:    Clear to auscultation bilaterally; normal respiratory effort CV:         Regular rate and rhythm; no murmurs Abd:      + bowel sounds; soft, non-tender; no palpable masses, no distension Ext:    No edema; adequate peripheral perfusion Skin:      Warm and dry; no rash Neuro: alert and oriented x 3 Psych: normal mood and affect  Data Reviewed:  Reviewed labs, radiology imaging, old records and pertinent past GI work up   Assessment and Plan/Recommendations:  52 year old female with history of morbid obesity, chronic kidney disease, dysphagia with ineffective esophageal motility here for follow-up visit Acute diarrhea and associated fecal incontinence with liquid stool likely secondary to antibiotics or infectious etiology Diarrhea has since resolved and her bowel habits are now back to normal Start Benefiber 1 teaspoon 3 times daily with meals Advised patient to start doing Keagle exercises daily  GERD and dysphagia symptoms stable Known esophageal dysmotility Okay to use Zantac as needed for GERD  related symptoms Diet and exercise for weight loss  Return in 1 year or sooner if needed   Greater than 50% of the time used for counseling as well as treatment plan and follow-up. She had multiple questions which were answered to her satisfaction  K. Scherry RanVeena Deren Degrazia , MD [phone]    CC: Georgann HousekeeperHusain, Karrar, MD

## 2018-06-29 ENCOUNTER — Other Ambulatory Visit: Payer: Self-pay | Admitting: Urology

## 2018-07-04 ENCOUNTER — Encounter (HOSPITAL_BASED_OUTPATIENT_CLINIC_OR_DEPARTMENT_OTHER): Payer: Self-pay

## 2018-07-10 ENCOUNTER — Encounter (HOSPITAL_BASED_OUTPATIENT_CLINIC_OR_DEPARTMENT_OTHER): Payer: Self-pay

## 2018-07-10 ENCOUNTER — Ambulatory Visit (HOSPITAL_BASED_OUTPATIENT_CLINIC_OR_DEPARTMENT_OTHER): Admit: 2018-07-10 | Payer: Medicare Other | Admitting: Urology

## 2018-07-10 HISTORY — DX: Diarrhea, unspecified: R19.7

## 2018-07-10 HISTORY — DX: Calculus of gallbladder without cholecystitis without obstruction: K80.20

## 2018-07-10 HISTORY — DX: Full incontinence of feces: R15.9

## 2018-07-10 HISTORY — DX: Unspecified osteoarthritis, unspecified site: M19.90

## 2018-07-10 HISTORY — DX: Obstructive sleep apnea (adult) (pediatric): G47.33

## 2018-07-10 HISTORY — DX: Dependence on other enabling machines and devices: Z99.89

## 2018-07-10 HISTORY — DX: Other cervical disc degeneration, unspecified cervical region: M50.30

## 2018-07-10 HISTORY — DX: Other specified soft tissue disorders: M79.89

## 2018-07-10 HISTORY — DX: Other hemorrhoids: K64.8

## 2018-07-10 HISTORY — DX: Visible peristalsis: R19.2

## 2018-07-10 SURGERY — BOTOX INJECTION
Anesthesia: General

## 2018-10-31 ENCOUNTER — Emergency Department (HOSPITAL_COMMUNITY)
Admission: EM | Admit: 2018-10-31 | Discharge: 2018-10-31 | Disposition: A | Discharge status: Home / Self Care | Payer: Medicare Other | Attending: Emergency Medicine | Admitting: Emergency Medicine

## 2018-10-31 ENCOUNTER — Other Ambulatory Visit: Payer: Self-pay

## 2018-10-31 ENCOUNTER — Emergency Department (HOSPITAL_COMMUNITY): Payer: Medicare Other

## 2018-10-31 ENCOUNTER — Encounter (HOSPITAL_COMMUNITY): Payer: Self-pay

## 2018-10-31 DIAGNOSIS — Z79899 Other long term (current) drug therapy: Secondary | ICD-10-CM | POA: Insufficient documentation

## 2018-10-31 DIAGNOSIS — R404 Transient alteration of awareness: Secondary | ICD-10-CM | POA: Insufficient documentation

## 2018-10-31 DIAGNOSIS — F10929 Alcohol use, unspecified with intoxication, unspecified: Secondary | ICD-10-CM | POA: Diagnosis present

## 2018-10-31 DIAGNOSIS — I10 Essential (primary) hypertension: Secondary | ICD-10-CM | POA: Insufficient documentation

## 2018-10-31 DIAGNOSIS — R296 Repeated falls: Secondary | ICD-10-CM

## 2018-10-31 DIAGNOSIS — F10920 Alcohol use, unspecified with intoxication, uncomplicated: Secondary | ICD-10-CM

## 2018-10-31 LAB — CBG MONITORING, ED: GLUCOSE-CAPILLARY: 135 mg/dL — AB (ref 70–99)

## 2018-10-31 MED ORDER — SODIUM CHLORIDE 0.9 % IV BOLUS (SEPSIS)
1000.0000 mL | Freq: Once | INTRAVENOUS | Status: AC
  Administered 2018-10-31: 1000 mL via INTRAVENOUS

## 2018-10-31 MED ORDER — ONDANSETRON HCL 4 MG/2ML IJ SOLN
4.0000 mg | Freq: Once | INTRAMUSCULAR | Status: AC
  Administered 2018-10-31: 4 mg via INTRAVENOUS
  Filled 2018-10-31: qty 2

## 2018-10-31 NOTE — ED Notes (Signed)
Patient verbalizes understanding of discharge instructions. Opportunity for questioning and answers were provided. Armband removed by staff, pt discharged from ED in wheelchair.  

## 2018-10-31 NOTE — ED Provider Notes (Signed)
TIME SEEN: 2:00 AM  CHIEF COMPLAINT: Alcohol intoxication, fall  HPI: Patient is a 53 year old female with history of hypertension, depression who presents to the emergency department the EMS for alcohol intoxication and falls.  Reportedly patient drank approximately 375 mL of Everclear tonight and then had 2 falls.  It is unclear if she hit her head.  No family is at bedside at this time.  Patient is extremely poor historian as she is very intoxicated.  She states she is not sure if she drank alcohol tonight but denies any drug use.  She denies any pain at this time.  Blood glucose with EMS was normal.  ROS: Level 5 caveat for a intoxication  PAST MEDICAL HISTORY/PAST SURGICAL HISTORY:  Past Medical History:  Diagnosis Date  . Allergy   . Anxiety   . Bipolar 2 disorder (HCC)   . Cholelithiasis 02/2017  . DDD (degenerative disc disease), cervical   . Depression   . Diarrhea   . Fecal incontinence   . GERD (gastroesophageal reflux disease)   . HTN (hypertension)   . Internal hemorrhoids 10/2017   noted on colonoscopy  . Leg swelling   . Morbid obesity (HCC)   . OA (osteoarthritis)   . OSA on CPAP   . Peristalsis 12/2017   weak and frequent failed noted on esophageal manometry  . Seizure (HCC) 02/2018  . Urinary incontinence   . UTI (urinary tract infection) 04/10/2018   Hospitalized    MEDICATIONS:  Prior to Admission medications   Medication Sig Start Date End Date Taking? Authorizing Provider  albuterol (PROVENTIL HFA;VENTOLIN HFA) 108 (90 Base) MCG/ACT inhaler Inhale 2 puffs into the lungs every 4 (four) hours as needed for wheezing or shortness of breath. 05/08/17   Veryl Speak, FNP  clonazePAM (KLONOPIN) 1 MG tablet Take 1 tablet by mouth 2 (two) times daily.  12/20/17   [provider]  hydrocortisone 2.5 % cream Apply 1 application topically as needed (For eczema.).  12/08/17   [provider]  irbesartan (AVAPRO) 300 MG tablet Take 300 mg by mouth  daily. For 30 days 03/23/18   [provider]  ondansetron (ZOFRAN) 8 MG tablet Take 8 mg by mouth 2 (two) times daily. 04/09/18   [provider]  ranitidine (ZANTAC) 150 MG tablet Take 150 mg by mouth 2 (two) times daily. 03/10/18   [provider]  tolterodine (DETROL LA) 4 MG 24 hr capsule Take 4 mg by mouth daily. 11/30/17   [provider]  topiramate (TOPAMAX) 100 MG tablet Take 300 mg by mouth daily after supper.  11/16/17   [provider]  triamcinolone (KENALOG) 0.025 % cream Apply 1 application topically 2 (two) times daily. Patient taking differently: Apply 1 application topically 2 (two) times daily as needed (For eczema.).  07/10/17   Veryl Speak, FNP  ziprasidone (GEODON) 60 MG capsule Take 60 mg by mouth at bedtime.    [provider]  zolpidem (AMBIEN) 10 MG tablet Take 10 mg by mouth at bedtime.     [provider]    ALLERGIES:  Allergies  Allergen Reactions  . Penicillins Itching and Other (See Comments)    Has patient had a PCN reaction causing immediate rash, facial/tongue/throat swelling, SOB or lightheadedness with hypotension: no Has patient had a PCN reaction causing severe rash involving mucus membranes or skin necrosis: no Has patient had a PCN reaction that required hospitalization: no Has patient had a PCN reaction occurring within  the last 10 years: no If all of the above answers are "NO", then may proceed with Cephalosporin use.   . Sulfa Antibiotics Swelling    SOCIAL HISTORY:  Social History   Tobacco Use  . Smoking status: Never Smoker  . Smokeless tobacco: Never Used  Substance Use Topics  . Alcohol use: No    FAMILY HISTORY: Family History  Problem Relation Age of Onset  . Hypertension Mother   . Hypertension Father   . Diabetes Paternal Aunt   . Diabetes Paternal Uncle   . Colon cancer Neg Hx   . Stomach cancer Neg Hx   . Pancreatic cancer Neg Hx     EXAM: BP 133/63 (BP  Location: Right Arm)   Pulse 96   Resp 16   Ht 5\' 6"  (1.676 m)   Wt 126 kg   SpO2 97% Comment: Simultaneous filing. User may not have seen previous data.  BMI 44.83 kg/m  CONSTITUTIONAL: Alert and oriented person only.  Is very intoxicated.  Does not answer questions appropriately.  Repeatedly stating "91".  Obese. HEAD: Normocephalic; atraumatic EYES: Conjunctivae clear, PERRL, EOMI ENT: normal nose; no rhinorrhea; moist mucous membranes; pharynx without lesions noted; no dental injury; no septal hematoma NECK: Supple, no meningismus, no LAD; no midline spinal tenderness, step-off or deformity; trachea midline CARD: RRR; S1 and S2 appreciated; no murmurs, no clicks, no rubs, no gallops RESP: Normal chest excursion without splinting or tachypnea; breath sounds clear and equal bilaterally; no wheezes, no rhonchi, no rales; no hypoxia or respiratory distress CHEST:  chest wall stable, no crepitus or ecchymosis or deformity, nontender to palpation; no flail chest ABD/GI: Normal bowel sounds; non-distended; soft, non-tender, no rebound, no guarding; no ecchymosis or other lesions noted PELVIS:  stable, nontender to palpation BACK:  The back appears normal and is non-tender to palpation, there is no CVA tenderness; no midline spinal tenderness, step-off or deformity EXT: Normal ROM in all joints; non-tender to palpation; no edema; normal capillary refill; no cyanosis, no bony tenderness or bony deformity of patient's extremities, no joint effusion, compartments are soft, extremities are warm and well-perfused, no ecchymosis SKIN: Normal color for age and race; warm NEURO: Moves all extremities equally, no obvious facial asymmetry, slurred speech, no tremors, no asterixis, confused PSYCH: The patient's mood and manner are appropriate. Grooming and personal hygiene are appropriate.  MEDICAL DECISION MAKING: Patient here with alcohol intoxication likely causing her confusion.  She did reportedly have  2 falls at home and given she is not answering questions appropriately, will obtain CT head of her head and cervical spine.  Will repeat blood glucose here in the ED.  Will give IV fluids, Zofran.  Will monitor closely.  ED PROGRESS: Patient CT head and cervical spine show no acute abnormality.  Blood glucose 135.  She will be monitored until she is clinically sober and then discharged home.   5:00 AM  Pt now clinically sober.  She has been able to ambulate without assistance with steady gait.  She has now oriented x3 and has no complaints.  Her family is here to drive her home.  I feel she is safe to be discharged.   At this time, I do not feel there is any life-threatening condition present. I have reviewed and discussed all results (EKG, imaging, lab, urine as appropriate) and exam findings with patient/family. I have reviewed nursing notes and appropriate previous records.  I feel the patient is safe to be discharged home without further  emergent workup and can continue workup as an outpatient as needed. Discussed usual and customary return precautions. Patient/family verbalize understanding and are comfortable with this plan.  Outpatient follow-up has been provided as needed. All questions have been answered.      Lafaye Mcelmurry, Layla Maw, DO 10/31/18 [phone]

## 2018-10-31 NOTE — ED Notes (Signed)
Pt ambulated to restroom and back to bed. Pt steady on her feet.

## 2018-10-31 NOTE — ED Triage Notes (Signed)
Per [facility], pt from home w/ a c/o ETOH intoxication. It was reported by family that the pt drank 375 ml of Everclear by herself. The pt had two falls, and on the second fall family was unable to get her up. Pt denied injury. No injuries noted. No LOC. Pt is alert to self only.   108/67 HR 94 97% RA CBG 121  18 ga IV L hand

## 2019-05-29 IMAGING — US US ABDOMEN LIMITED
1 series · 14 of 25 positions shown · non-contrast
Comparison: None.

CLINICAL DATA: Initial evaluation for acute right upper quadrant
pain for 1 week.

EXAM:
US ABDOMEN LIMITED - RIGHT UPPER QUADRANT

[Series 1: us abdomen limited · 0.26mm/px · 14 of 39 slices shown]
[im 1/39]
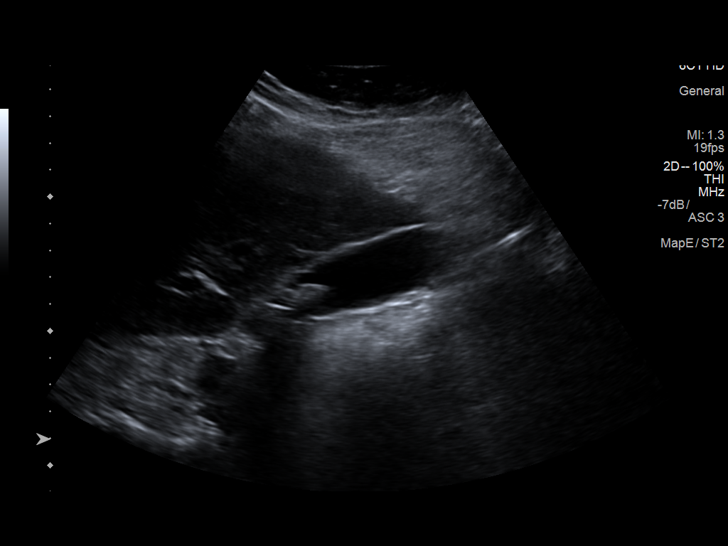
[im 4/39]
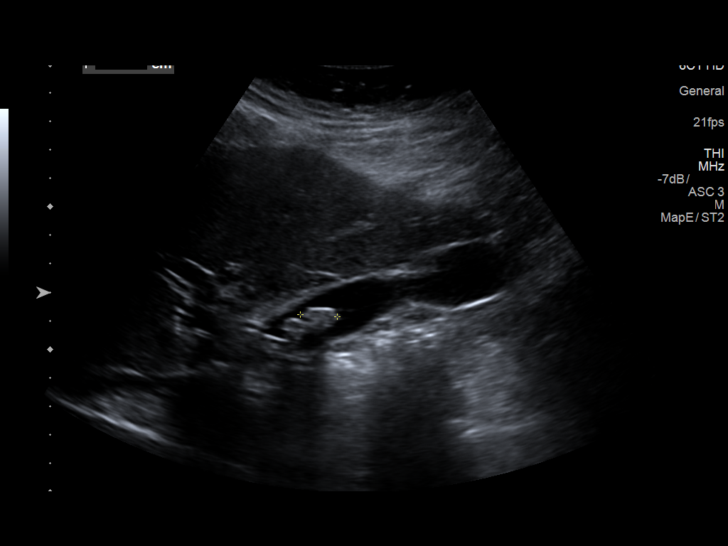
[im 7/39]
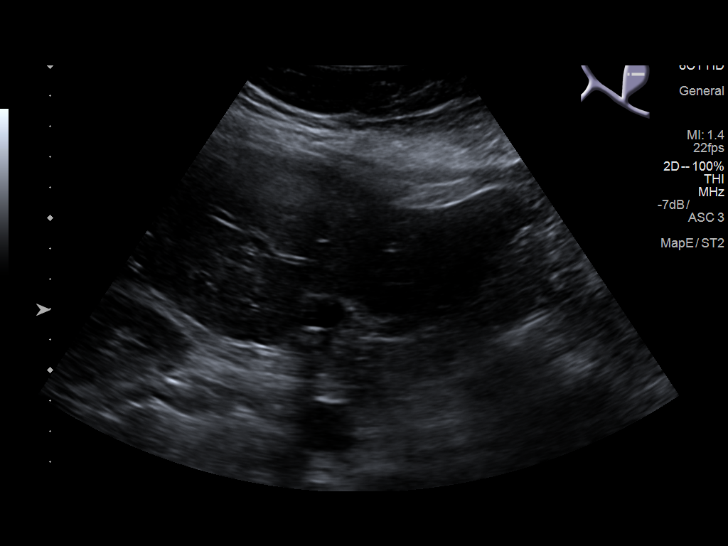
[im 10/39]
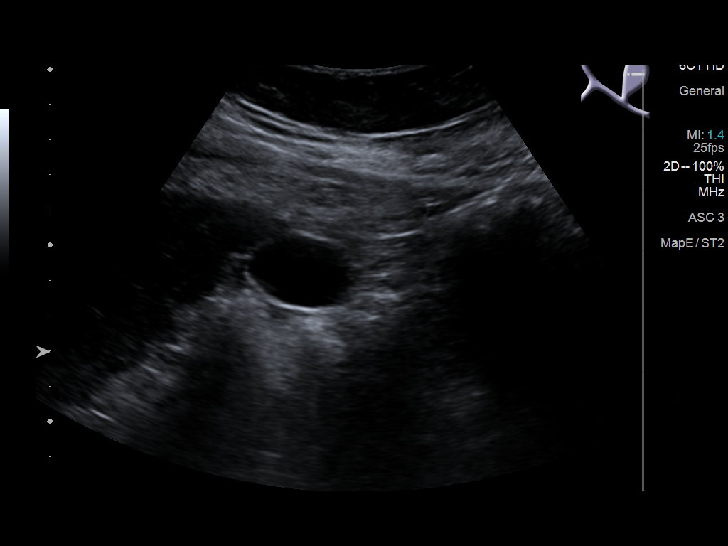
[im 13/39]
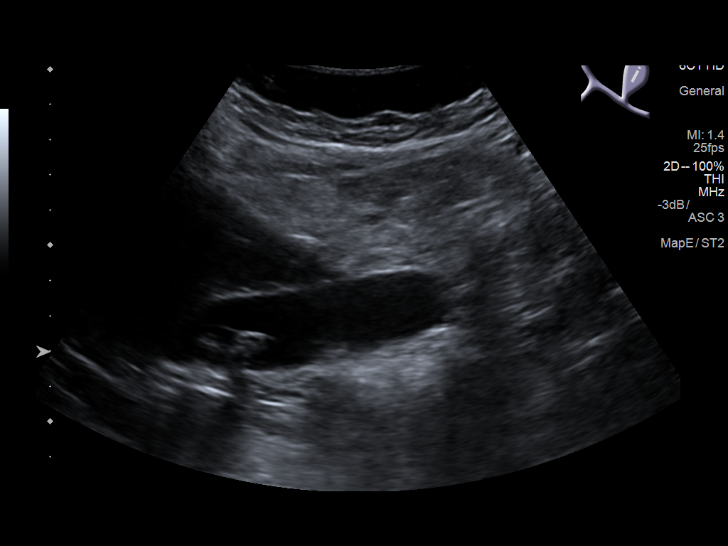
[im 15/39]
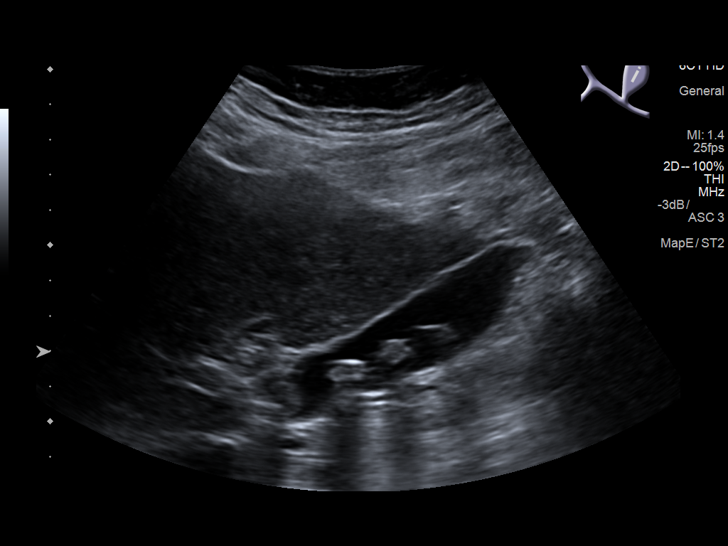
[im 18/39]
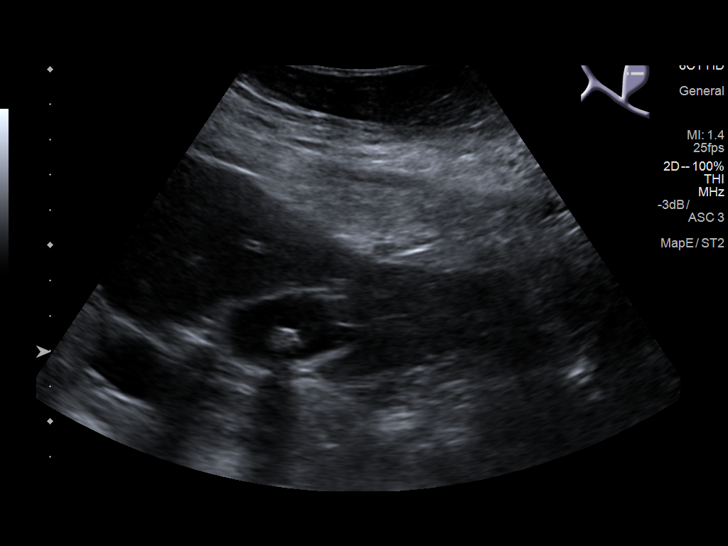
[im 21/39]
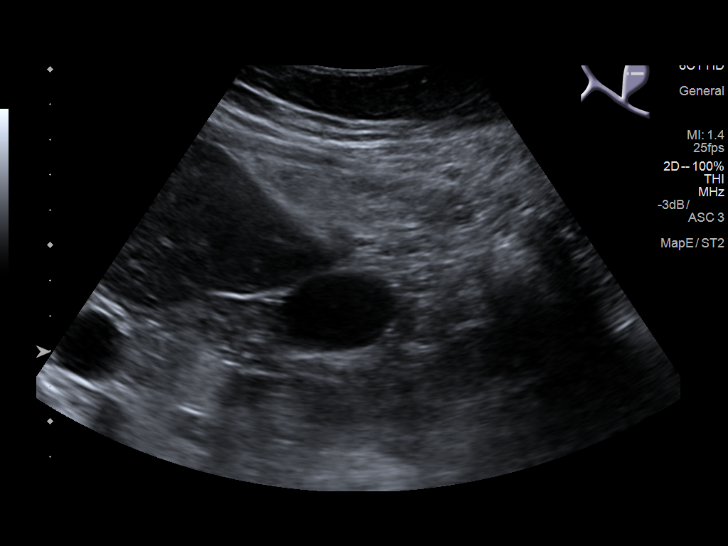
[im 24/39]
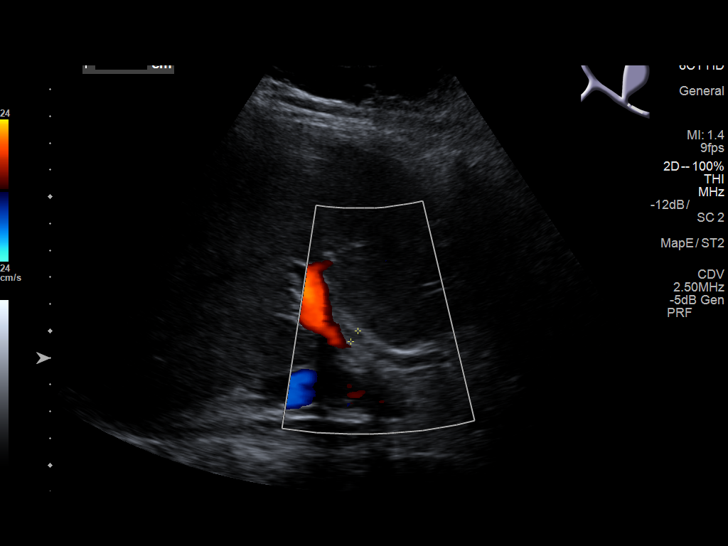
[im 26/39]
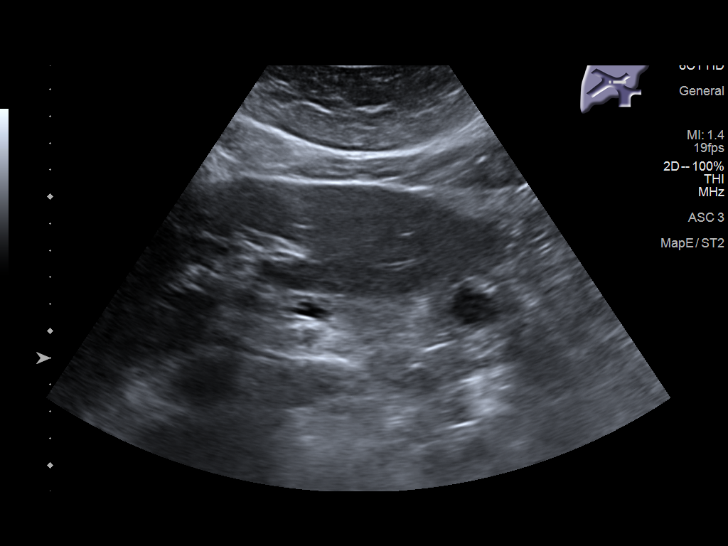
[im 29/39]
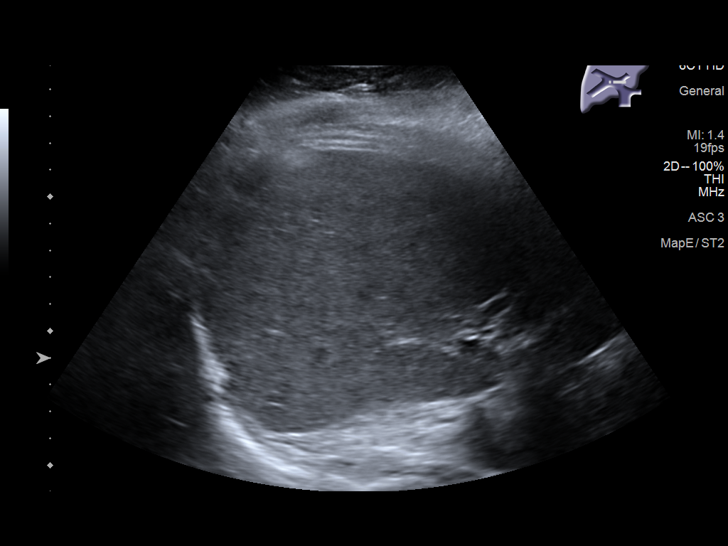
[im 32/39]
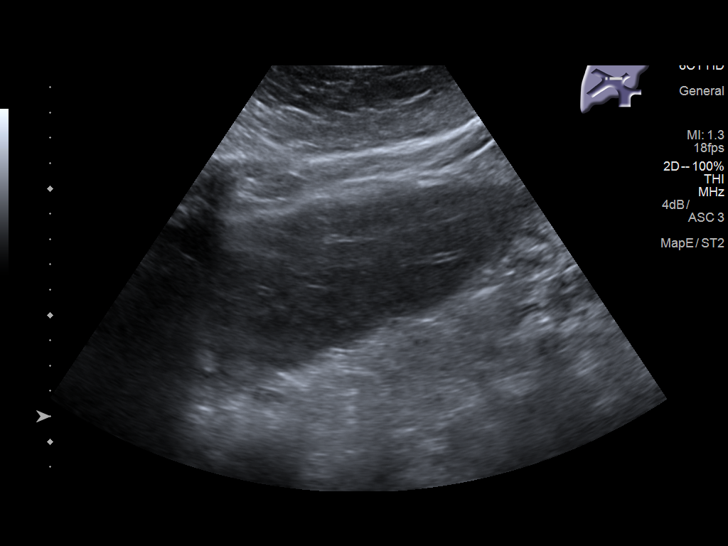
[im 35/39]
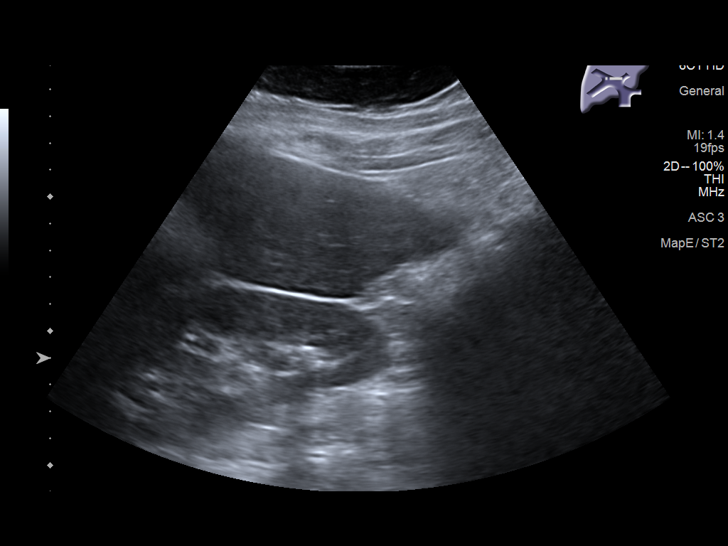
[im 39/39]
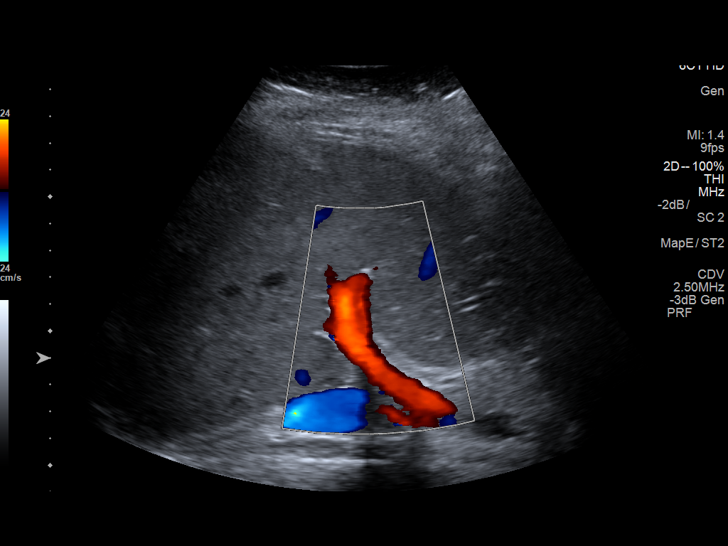

[14 of 25 positions shown; findings below may reference images not displayed]

FINDINGS: Gallbladder:

Shadowing echogenic stones measuring up to 1.3 cm present within the
gallbladder lumen. Gallbladder wall measure within normal limits at
2.4 mm. No free pericholecystic fluid. No sonographic Murphy sign
elicited on exam.

Common bile duct:

Diameter: 4.8 mm

Liver:

No focal lesion identified. Within normal limits in parenchymal
echogenicity.
IMPRESSION: 1. Cholelithiasis without sonographic features for acute
cholecystitis.
2. No biliary dilatation.

## 2019-06-07 ENCOUNTER — Emergency Department (HOSPITAL_COMMUNITY)
Admission: EM | Admit: 2019-06-07 | Discharge: 2019-06-07 | Disposition: A | Discharge status: Home / Self Care | Payer: Medicare Other | Attending: Emergency Medicine | Admitting: Emergency Medicine

## 2019-06-07 ENCOUNTER — Encounter (HOSPITAL_COMMUNITY): Payer: Self-pay | Admitting: Emergency Medicine

## 2019-06-07 ENCOUNTER — Other Ambulatory Visit: Payer: Self-pay

## 2019-06-07 DIAGNOSIS — N183 Chronic kidney disease, stage 3 (moderate): Secondary | ICD-10-CM | POA: Insufficient documentation

## 2019-06-07 DIAGNOSIS — I129 Hypertensive chronic kidney disease with stage 1 through stage 4 chronic kidney disease, or unspecified chronic kidney disease: Secondary | ICD-10-CM | POA: Diagnosis not present

## 2019-06-07 DIAGNOSIS — M25562 Pain in left knee: Secondary | ICD-10-CM | POA: Insufficient documentation

## 2019-06-07 DIAGNOSIS — Z79899 Other long term (current) drug therapy: Secondary | ICD-10-CM | POA: Insufficient documentation

## 2019-06-07 DIAGNOSIS — G8929 Other chronic pain: Secondary | ICD-10-CM

## 2019-06-07 DIAGNOSIS — M25561 Pain in right knee: Secondary | ICD-10-CM | POA: Insufficient documentation

## 2019-06-07 MED ORDER — OXYCODONE HCL 5 MG PO TABS: 2.5000 mg | ORAL_TABLET | Freq: Four times a day (QID) | ORAL | 0 refills | Status: DC | PRN

## 2019-06-07 NOTE — ED Notes (Signed)
Pt discharged with all belongings. Discharge instructions reviewed with pt, and pt verbalized understanding. Opportunity for questions provided. Pt unable to sign discharge signature. 

## 2019-06-07 NOTE — ED Provider Notes (Signed)
[facility] Note   CSN: [id number] Arrival date & time: 06/07/19  1545     History   Chief Complaint Chief Complaint  Patient presents with  . Knee Pain    HPI Shannon Sharp is a 53 y.o. female who presents emergency department with bilateral knee pain.  Patient states that both of her knees are "totally shot."  She states that she needs bilateral knee replacements however she has to lose a certain amount of weight prior to surgery.  She is being followed by Dr. Devonne DoughtyNoris.  She states that he did give her some hydrocodone when he saw her back in June she states it really did not do very much for her pain.  She has been taking Tylenol and using over-the-counter joint pain creams with little relief.  She states that her pain is usually an 8 out of 10.  She has constant knee pain which is worse with ambulation and becomes sharp and severe when she ambulates.  She states that today however her pain is 10 out of 10 and she needed some relief.  She is not under any active pain contract.  She has been referred to pain management but has not yet had any prescriptions from them.  She has had previous knee injections including steroid and Synvisc without relief of her symptoms.  She denies any new swelling, heat, redness, fevers or chills.    HPI  Past Medical History:  Diagnosis Date  . Allergy   . Anxiety   . Bipolar 2 disorder (HCC)   . Cholelithiasis 02/2017  . DDD (degenerative disc disease), cervical   . Depression   . Diarrhea   . Fecal incontinence   . GERD (gastroesophageal reflux disease)   . HTN (hypertension)   . Internal hemorrhoids 10/2017   noted on colonoscopy  . Leg swelling   . Morbid obesity (HCC)   . OA (osteoarthritis)   . OSA on CPAP   . Peristalsis 12/2017   weak and frequent failed noted on esophageal manometry  . Seizure (HCC) 02/2018  . Urinary incontinence   . UTI (urinary tract infection) 04/10/2018    Hospitalized    Patient Active Problem List   Diagnosis Date Noted  . CKD (chronic kidney disease), stage III (HCC) 04/11/2018  . Bacteremia 04/10/2018  . Acute lower UTI 04/10/2018  . Heartburn   . Dysphagia   . Seasonal allergic rhinitis 05/08/2017  . Gastroesophageal reflux disease with esophagitis 05/08/2017  . Urinary frequency 03/02/2017  . Acute pain of both knees 03/02/2017  . Class 3 severe obesity due to excess calories without serious comorbidity with body mass index (BMI) of 45.0 to 49.9 in adult (HCC) 03/02/2017  . OBESITY, NOS 12/28/2006  . ANEMIA, IRON DEFICIENCY, UNSPEC. 12/28/2006  . DEPRESSION, MAJOR, RECURRENT 12/28/2006  . Bipolar 2 disorder (HCC) 12/28/2006  . ANXIETY 12/28/2006  . PANIC ATTACKS 12/28/2006  . OBSESSIVE COMPUL. DISORDER 12/28/2006  . ATTENTION DEFICIT, W/HYPERACTIVITY 12/28/2006  . HEARING LOSS NOS OR DEAFNESS 12/28/2006  . Atopic dermatitis 12/28/2006  . INSOMNIA NOS 12/28/2006    Past Surgical History:  Procedure Laterality Date  . 24 HOUR PH STUDY N/A 01/08/2018   Procedure: 24 HOUR PH STUDY;  Surgeon: Napoleon FormNandigam, Kavitha V, MD;  Location: [facility];  Service: Endoscopy;  Laterality: N/A;  . COLONOSCOPY  11/08/2017  . ESOPHAGEAL MANOMETRY N/A 01/08/2018   Procedure: ESOPHAGEAL MANOMETRY (EM);  Surgeon: Napoleon FormNandigam, Kavitha V, MD;  [facility];  Service: Endoscopy;  Laterality: N/A;  . ESOPHAGOGASTRODUODENOSCOPY ENDOSCOPY  11/08/2017     OB History   No obstetric history on file.      Home Medications    Prior to Admission medications   Medication Sig Start Date End Date Taking? Authorizing Provider  albuterol (PROVENTIL HFA;VENTOLIN HFA) 108 (90 Base) MCG/ACT inhaler Inhale 2 puffs into the lungs every 4 (four) hours as needed for wheezing or shortness of breath. 05/08/17   Veryl Speakalone, Gregory D, FNP  clonazePAM (KLONOPIN) 1 MG tablet Take 1 tablet by mouth 2 (two) times daily.  12/20/17   [provider]   hydrocortisone 2.5 % cream Apply 1 application topically as needed (For eczema.).  12/08/17   [provider]  irbesartan (AVAPRO) 300 MG tablet Take 300 mg by mouth daily. For 30 days 03/23/18   [provider]  ondansetron (ZOFRAN) 8 MG tablet Take 8 mg by mouth 2 (two) times daily. 04/09/18   [provider]  ranitidine (ZANTAC) 150 MG tablet Take 150 mg by mouth 2 (two) times daily. 03/10/18   [provider]  tolterodine (DETROL LA) 4 MG 24 hr capsule Take 4 mg by mouth daily. 11/30/17   [provider]  topiramate (TOPAMAX) 100 MG tablet Take 300 mg by mouth daily after supper.  11/16/17   [provider]  triamcinolone (KENALOG) 0.025 % cream Apply 1 application topically 2 (two) times daily. Patient taking differently: Apply 1 application topically 2 (two) times daily as needed (For eczema.).  07/10/17   Veryl Speakalone, Gregory D, FNP  ziprasidone (GEODON) 60 MG capsule Take 60 mg by mouth at bedtime.    [provider]  zolpidem (AMBIEN) 10 MG tablet Take 10 mg by mouth at bedtime.     [provider]    Family History Family History  Problem Relation Age of Onset  . Hypertension Mother   . Hypertension Father   . Diabetes Paternal Aunt   . Diabetes Paternal Uncle   . Colon cancer Neg Hx   . Stomach cancer Neg Hx   . Pancreatic cancer Neg Hx     Social History Social History   Tobacco Use  . Smoking status: Never Smoker  . Smokeless tobacco: Never Used  Substance Use Topics  . Alcohol use: Yes  . Drug use: No     Allergies   Penicillins and Sulfa antibiotics   Review of Systems Review of Systems  Ten systems reviewed and are negative for acute change, except as noted in the HPI.   Physical Exam Updated Vital Signs BP 139/81   Pulse 77   Temp 98.7 F (37.1 C) (Oral)   Resp 16   SpO2 99%   Physical Exam Vitals signs and nursing note reviewed.  Constitutional:      General: She is not in acute  distress.    Appearance: She is well-developed. She is not diaphoretic.  HENT:     Head: Normocephalic and atraumatic.  Eyes:     General: No scleral icterus.    Conjunctiva/sclera: Conjunctivae normal.  Neck:     Musculoskeletal: Normal range of motion.  Cardiovascular:     Rate and Rhythm: Normal rate and regular rhythm.     Heart sounds: Normal heart sounds. No murmur. No friction rub. No gallop.   Pulmonary:     Effort: Pulmonary effort is normal. No respiratory distress.     Breath sounds: Normal breath sounds.  Abdominal:  General: Bowel sounds are normal. There is no distension.     Palpations: Abdomen is soft. There is no mass.     Tenderness: There is no abdominal tenderness. There is no guarding.  Musculoskeletal:     Comments: Bilateral knees with varus deformity, Obvious joint space narrowing.  She is tender with passive and active range of motion.  No obvious effusion.  Limited physical examination secondary to body habitus.  Normal DP and PT pulse and sensation in the lower extremities  Skin:    General: Skin is warm and dry.  Neurological:     Mental Status: She is alert and oriented to person, place, and time.  Psychiatric:        Behavior: Behavior normal.      ED Treatments / Results  Labs (all labs ordered are listed, but only abnormal results are displayed) Labs Reviewed - No data to display  EKG None  Radiology No results found.  Procedures Procedures (including critical care time)  Medications Ordered in ED Medications - No data to display   Initial Impression / Assessment and Plan / ED Course  I have reviewed the triage vital signs and the nursing notes.  Pertinent labs & imaging results that were available during my care of the patient were reviewed by me and considered in my medical decision making (see chart for details).       Patient with acute on chronic bilateral knee pain.  Having difficulty ambulating secondary to her severe  pain.  She is in between doctors and currently in a bit of a pain crisis.  Patient will be given a very small course of narcotic pain medications after PDMP reviewed.  She is having significant difficulty ambulation.  She is advised to follow closely with her new pain management specialist and with Dr. Veverly Fells.  No obvious evidence of septic joints, no new injuries.  Discussed return precautions.  Patient appears appropriate for discharge at this time  Final Clinical Impressions(s) / ED Diagnoses   Final diagnoses:  None    ED Discharge Orders    None       Margarita Mail, PA-C 06/07/19 1750    Drenda Freeze, MD 06/07/19 [phone]

## 2019-06-07 NOTE — Discharge Instructions (Signed)
Contact a health care provider if:  Your knee pain continues, changes, or gets worse.  You have a fever along with knee pain.  Your knee feels warm to the touch.  Your knee buckles or locks up.  Get help right away if:  Your knee swells, and the swelling becomes worse.  You cannot move your knee.  You have severe pain in your knee.

## 2019-06-07 NOTE — ED Triage Notes (Signed)
Pt here for eval of bilateral knee pain from arthritis that is worse today. Denies recent injury.

## 2019-08-01 ENCOUNTER — Other Ambulatory Visit (HOSPITAL_COMMUNITY)
Admission: RE | Admit: 2019-08-01 | Discharge: 2019-08-01 | Disposition: A | Discharge status: Home / Self Care | Payer: Medicare Other | Source: Ambulatory Visit | Attending: Nurse Practitioner | Admitting: Nurse Practitioner

## 2019-08-01 ENCOUNTER — Other Ambulatory Visit: Payer: Self-pay | Admitting: Nurse Practitioner

## 2019-08-01 DIAGNOSIS — Z124 Encounter for screening for malignant neoplasm of cervix: Secondary | ICD-10-CM | POA: Diagnosis present

## 2019-08-07 LAB — CYTOLOGY - PAP
Diagnosis: UNDETERMINED — AB
High risk HPV: POSITIVE — AB

## 2019-11-27 IMAGING — CT CT CERVICAL SPINE W/O CM
5 of 8 series · 11 of 33 positions shown, 12 images · non-contrast
Comparison: CT of the head performed 02/21/2005, and CT of the
cervical spine performed 01/01/2018

CLINICAL DATA: Status post falls, with altered level of
consciousness. Concern for head or cervical spine injury. Initial
encounter.

EXAM:
CT HEAD WITHOUT CONTRAST
CT CERVICAL SPINE WITHOUT CONTRAST
TECHNIQUE: Multidetector CT imaging of the head and cervical spine was
performed following the standard protocol without intravenous
contrast. Multiplanar CT image reconstructions of the cervical spine
were also generated.

[Series 4: head bone · axial · 0.45mm/px · z∈[+1183,+1241]mm · 2 of 87 slices shown]
[im 29/87  bone]
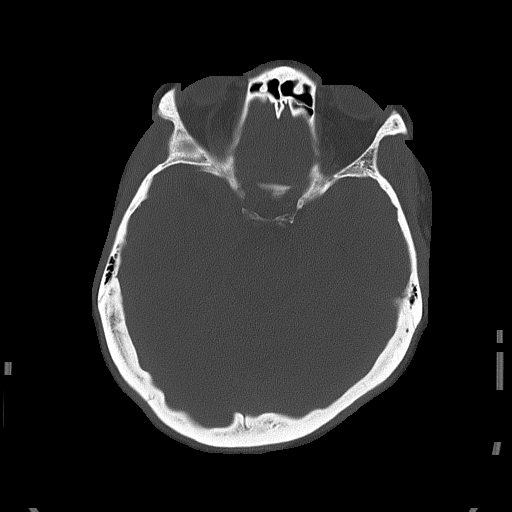
[im 58/87  bone]
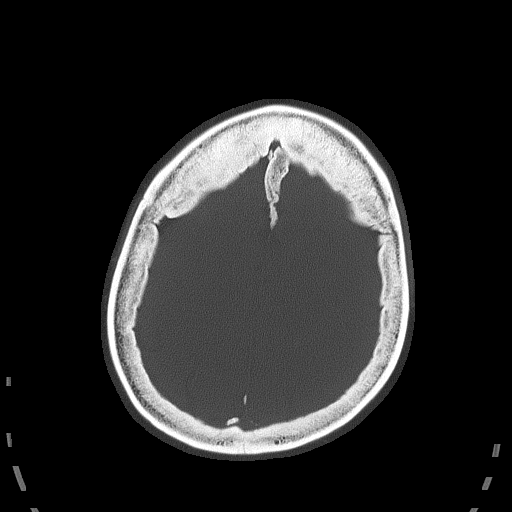

[Series 7: c_spine 2.0 st · axial · 0.25mm/px · z∈[+1042,+1092]mm · 2 of 77 slices shown, 3 images]
[im 26/77  soft-tissue]
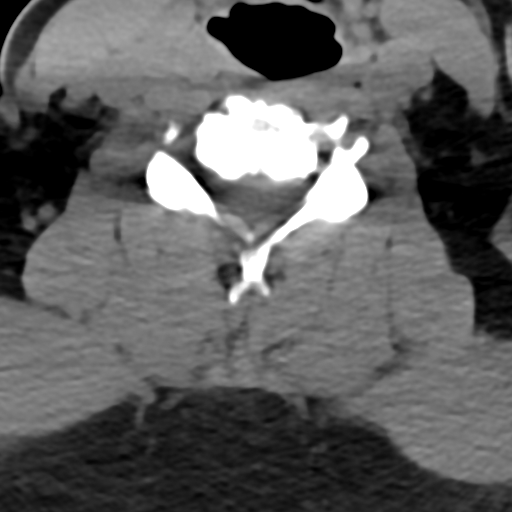
[im 26/77  bone]
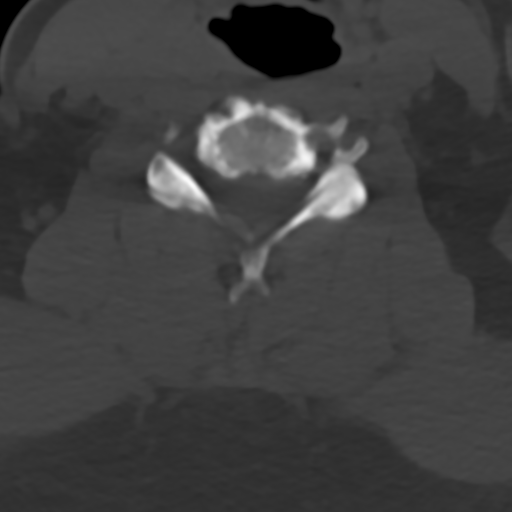
[im 51/77  bone]
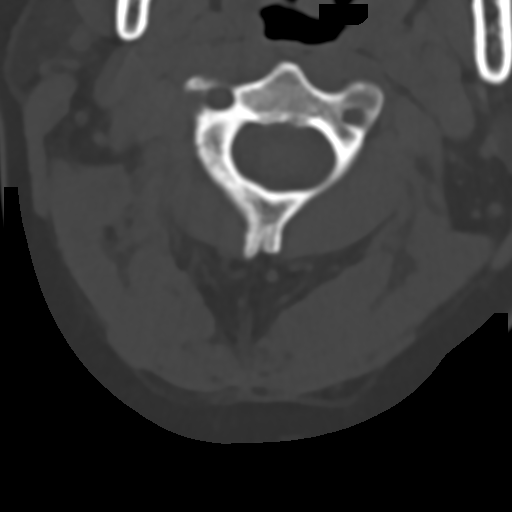

[Series 11: c_spine 2.0 sag bone · sagittal · 0.23mm/px · 4 of 61 slices shown]
[im 13/61  bone]
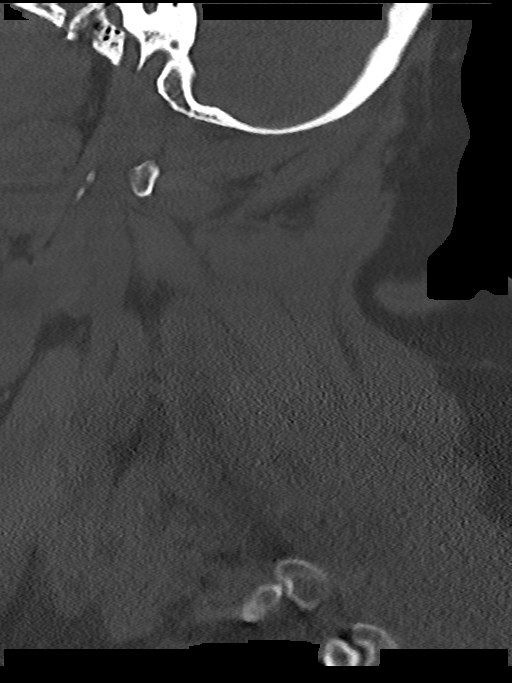
[im 25/61  bone]
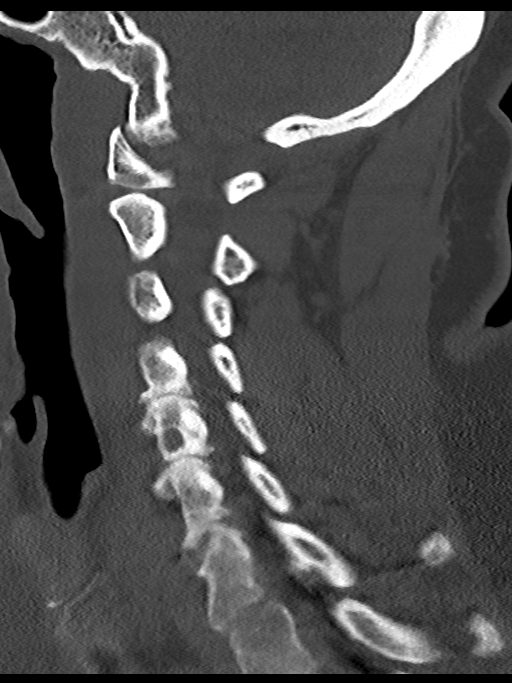
[im 37/61  bone]
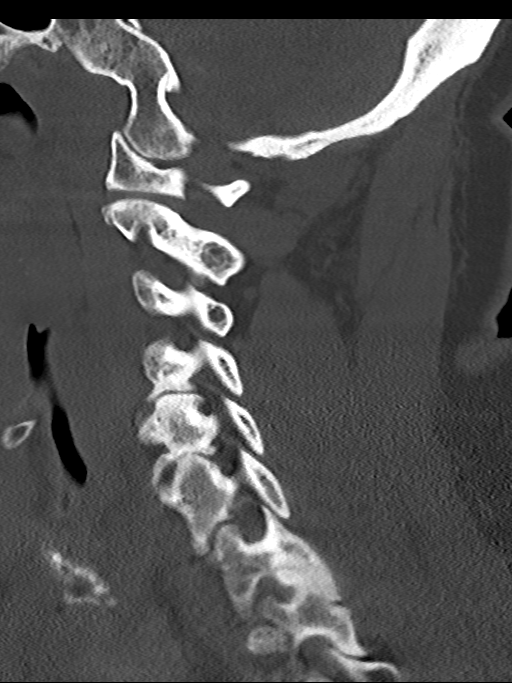
[im 49/61  bone]
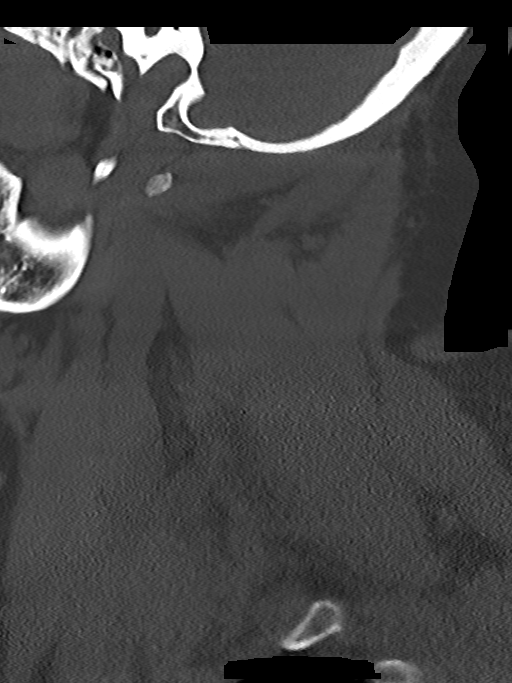

[Series 12: c_spine 2.0 cor bone · coronal · 0.23mm/px · 1 of 61 slices shown]
[im 31/61  bone]
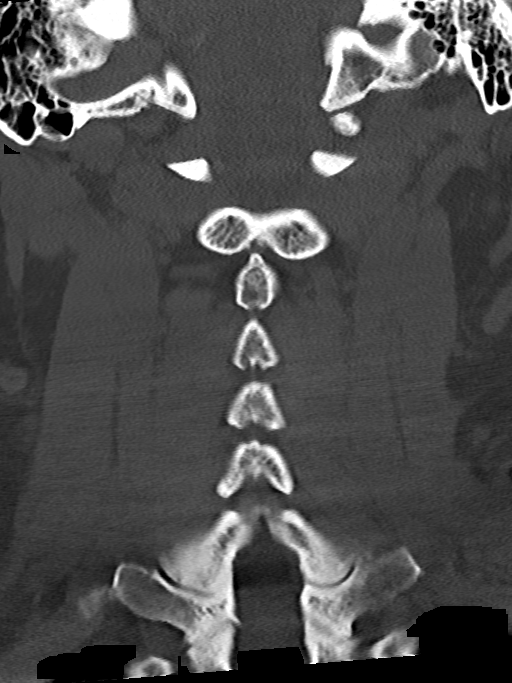

[Series 14: c_spine 2.0 orthogonals · axial · 0.21mm/px · z∈[+1024,+1082]mm · 2 of 78 slices shown]
[im 26/78  bone]
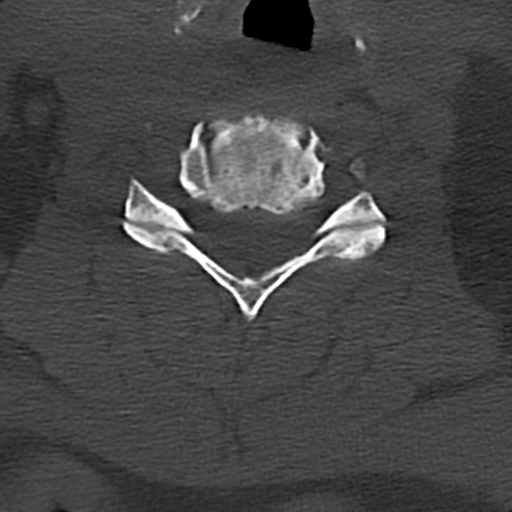
[im 52/78  bone]
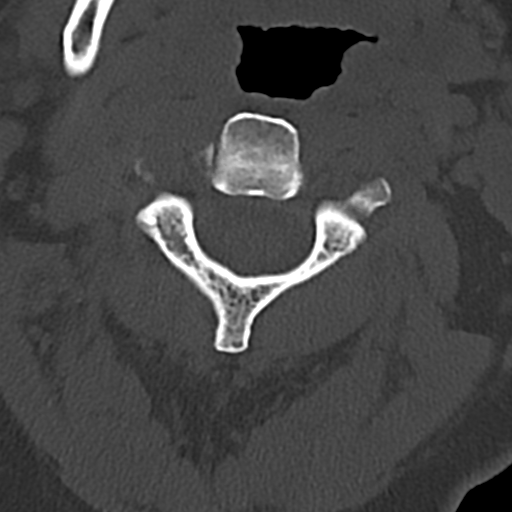

[11 of 33 positions shown; findings below may reference images not displayed]

FINDINGS: CT HEAD FINDINGS

Brain: No evidence of acute infarction, hemorrhage, hydrocephalus,
extra-axial collection or mass lesion/mass effect.

The posterior fossa, including the cerebellum, brainstem and fourth
ventricle, is within normal limits. The third and lateral
ventricles, and basal ganglia are unremarkable in appearance. The
cerebral hemispheres are symmetric in appearance, with normal
gray-white differentiation. No mass effect or midline shift is seen.

Vascular: No hyperdense vessel or unexpected calcification.

Skull: There is no evidence of fracture; visualized osseous
structures are unremarkable in appearance.

Sinuses/Orbits: The orbits are within normal limits. The paranasal
sinuses and mastoid air cells are well-aerated.

Other: No significant soft tissue abnormalities are seen.

CT CERVICAL SPINE FINDINGS

Alignment: Normal.

Skull base and vertebrae: No acute fracture. No primary bone lesion
or focal pathologic process.

Soft tissues and spinal canal: No prevertebral fluid or swelling. No
visible canal hematoma.

Disc levels: Mild multilevel disc space narrowing is noted along the
lower cervical spine, with scattered anterior and posterior disc
osteophyte complexes. Underlying bony foramina are grossly
unremarkable.

Upper chest: The visualized portions of the thyroid gland are
unremarkable.

Other: No additional soft tissue abnormalities are seen.
IMPRESSION: 1. No evidence of traumatic intracranial injury or fracture.
2. No evidence of fracture or subluxation along the cervical spine.
3. Mild degenerative change along the lower cervical spine.

## 2020-01-29 ENCOUNTER — Telehealth: Payer: Self-pay | Admitting: Gastroenterology

## 2020-01-29 NOTE — Telephone Encounter (Signed)
Pt reported that she is experiencing fecal incontinence.  She is scheduled for an OV 02/12/20.  Please advise pt on what to do in the meantime.

## 2020-01-29 NOTE — Telephone Encounter (Signed)
Patient states she has not had this recently. Last seen for issues including fecal incontinence 06/04/2018. She was told to take Benefiber 1 tsp TID. Not on any fiber supplement presently. Afebrile. No nausea. States "I woke up and was a mess." She agrees to try the fiber until she is seen in the office for evaluation.

## 2020-02-12 ENCOUNTER — Encounter: Payer: Self-pay | Admitting: Gastroenterology

## 2020-02-12 ENCOUNTER — Ambulatory Visit (INDEPENDENT_AMBULATORY_CARE_PROVIDER_SITE_OTHER): Payer: Medicare Other | Admitting: Gastroenterology

## 2020-02-12 VITALS — BP 126/74 | HR 80 | Temp 97.9°F | Ht 66.0 in | Wt 288.1 lb

## 2020-02-12 DIAGNOSIS — R159 Full incontinence of feces: Secondary | ICD-10-CM | POA: Diagnosis not present

## 2020-02-12 MED ORDER — CHOLESTYRAMINE 4 G PO PACK: 4.0000 g | PACK | Freq: Three times a day (TID) | ORAL | 12 refills | Status: DC

## 2020-02-12 MED ORDER — CHOLESTYRAMINE 4 G PO PACK: 4.0000 g | PACK | Freq: Every day | ORAL | 1 refills | Status: DC

## 2020-02-12 NOTE — Progress Notes (Signed)
02/12/2020 Shannon Sharp [id number] [birth year]   HISTORY OF PRESENT ILLNESS:  This is a 54 year old female who is known to Dr. Lavon Paganini.  Here today with complaints of fecal incontinence.  She reports that her stools are on the looser side, but only has about 1 bowel movement per day.  About 3 times per week, however, she has leakage of a large amount of stool.  Says that this occurs without her knowledge or sensation that she needs to move her bowels.  Occurs at nighttime as well.  She says that this is only been an issue again for the past couple weeks, but says that she had a problem with this back in 2019 around the time of her colonoscopy.  It had no longer been an issue until just about 2 or 3 weeks ago.  She denies any change in her diet or medications or anything else that could account for this.  She denies rectal bleeding or abdominal pain.  She is currently just using Imodium as needed when she goes out and about to try to prevent it from happening as she has been afraid to go anywhere.  Last colonoscopy was January 2019 at which time she only had internal hemorrhoids present.  Past Medical History:  Diagnosis Date  . Allergy   . Anxiety   . Bipolar 2 disorder (HCC)   . Cholelithiasis 02/2017  . DDD (degenerative disc disease), cervical   . Depression   . Diarrhea   . Fecal incontinence   . GERD (gastroesophageal reflux disease)   . HTN (hypertension)   . Internal hemorrhoids 10/2017   noted on colonoscopy  . Leg swelling   . Morbid obesity (HCC)   . OA (osteoarthritis)   . OSA on CPAP   . Peristalsis 12/2017   weak and frequent failed noted on esophageal manometry  . Seizure (HCC) 02/2018  . Urinary incontinence   . UTI (urinary tract infection) 04/10/2018   Hospitalized   Past Surgical History:  Procedure Laterality Date  . 24 HOUR PH STUDY N/A 01/08/2018   Procedure: 24 HOUR PH STUDY;  Surgeon: Napoleon Form, MD;  Location: [facility];  Service:  Endoscopy;  Laterality: N/A;  . COLONOSCOPY  11/08/2017  . ESOPHAGEAL MANOMETRY N/A 01/08/2018   Procedure: ESOPHAGEAL MANOMETRY (EM);  Surgeon: Napoleon Form, MD;  Location: [facility];  Service: Endoscopy;  Laterality: N/A;  . ESOPHAGOGASTRODUODENOSCOPY ENDOSCOPY  11/08/2017    reports that she has never smoked. She has never used smokeless tobacco. She reports current alcohol use. She reports that she does not use drugs. family history includes Diabetes in her paternal aunt and paternal uncle; Hypertension in her father and mother. Allergies  Allergen Reactions  . Penicillins Itching and Other (See Comments)    Has patient had a PCN reaction causing immediate rash, facial/tongue/throat swelling, SOB or lightheadedness with hypotension: no Has patient had a PCN reaction causing severe rash involving mucus membranes or skin necrosis: no Has patient had a PCN reaction that required hospitalization: no Has patient had a PCN reaction occurring within the last 10 years: no If all of the above answers are "NO", then may proceed with Cephalosporin use.   . Sulfa Antibiotics Swelling      Outpatient Encounter Medications as of 02/12/2020  Medication Sig  . albuterol (PROVENTIL HFA;VENTOLIN HFA) 108 (90 Base) MCG/ACT inhaler Inhale 2 puffs into the lungs every 4 (four) hours as needed for wheezing or shortness of  breath.  Marland Kitchen amLODipine (NORVASC) 5 MG tablet 1 tablet daily.  . clonazePAM (KLONOPIN) 1 MG tablet Take 1 tablet by mouth 2 (two) times daily.   Marland Kitchen escitalopram (LEXAPRO) 10 MG tablet Take 10 mg by mouth every morning.  . irbesartan-hydrochlorothiazide (AVALIDE) 300-12.5 MG tablet 1 tablet daily.  Marland Kitchen tolterodine (DETROL LA) 4 MG 24 hr capsule Take 4 mg by mouth daily.  Marland Kitchen topiramate (TOPAMAX) 100 MG tablet Take 300 mg by mouth daily after supper.   . TRAZODONE HCL PO Take 75 mg by mouth daily.  . ziprasidone (GEODON) 60 MG capsule Take 60 mg by mouth at bedtime.  . [DISCONTINUED]  irbesartan (AVAPRO) 300 MG tablet Take 300 mg by mouth daily. For 30 days  . hydrocortisone 2.5 % cream Apply 1 application topically as needed (For eczema.).   Marland Kitchen ondansetron (ZOFRAN) 8 MG tablet Take 8 mg by mouth 2 (two) times daily.  . [DISCONTINUED] oxyCODONE (ROXICODONE) 5 MG immediate release tablet Take 0.5-1 tablets (2.5-5 mg total) by mouth every 6 (six) hours as needed for severe pain.  . [DISCONTINUED] ranitidine (ZANTAC) 150 MG tablet Take 150 mg by mouth 2 (two) times daily.  . [DISCONTINUED] triamcinolone (KENALOG) 0.025 % cream Apply 1 application topically 2 (two) times daily. (Patient taking differently: Apply 1 application topically 2 (two) times daily as needed (For eczema.). )  . [DISCONTINUED] zolpidem (AMBIEN) 10 MG tablet Take 10 mg by mouth at bedtime.    No facility-administered encounter medications on file as of 02/12/2020.     REVIEW OF SYSTEMS  : All other systems reviewed and negative except where noted in the History of Present Illness.   PHYSICAL EXAM: BP 126/74   Pulse 80   Temp 97.9 F (36.6 C)   Ht 5\' 6"  (1.676 m)   Wt 288 lb 2 oz (130.7 kg)   BMI 46.50 kg/m  General: Well developed AA female in no acute distress Head: Normocephalic and atraumatic Eyes:  Sclerae anicteric, conjunctiva pink. Ears: Normal auditory acuity Lungs: Clear throughout to auscultation; no increased WOB. Heart: Regular rate and rhythm; no M/R/G. Abdomen: Soft, non-distended.  BS present.  Non-tender. Rectal:  No external abnormalities noted.  DRE did not reveal any masses.  Resting tone maybe mildly decreased.  Squeeze present, maybe mildly decreased. Musculoskeletal: Symmetrical with no gross deformities  Skin: No lesions on visible extremities Extremities: No edema  Neurological: Alert oriented x 4, grossly non-focal Psychological:  Alert and cooperative. Normal mood and affect  ASSESSMENT AND PLAN: *54 year old female with complaints of fecal incontinence/leakage.  She  admits to some looser type stool, but only has about 1 bowel movement per day.  That is in addition to the leakage of stool that she gets, however.  Leaks a large amount of stool about 3 times a week for the past couple of weeks.  Stool comes without her knowledge or sensation that she needs to move her bowels.  Occurs at nighttime as well.  Reports having this issue a couple of years ago around the time of her colonoscopy, but it resolved and has not been an issue again until recently.  Exam today seemed consistent with may be just mildly decreased resting tone and squeeze.  We will plan for anorectal manometry.  May need pelvic floor physical therapy.  Is using Imodium and asked her to discontinue that.  We will try Questran powder, 1 packet daily to start to see if this helps firm her stool.  No improvement previously  with Benefiber that was recommended.  Prescription sent to pharmacy.   CC:  Georgann Housekeeper, MD

## 2020-02-12 NOTE — Patient Instructions (Signed)
If you are age 55 or older, your body mass index should be between 23-30. Your Body mass index is 46.5 kg/m. If this is out of the aforementioned range listed, please consider follow up with your Primary Care Provider.  If you are age 78 or younger, your body mass index should be between 19-25. Your Body mass index is 46.5 kg/m. If this is out of the aformentioned range listed, please consider follow up with your Primary Care Provider.   We have sent the following medications to your pharmacy for you to pick up at your convenience: Questran - 1 packet daily.   Stop Imodium.   Due to recent COVID-19 restrictions implemented by our local and state authorities and in an effort to keep both patients and staff as safe as possible, our hospital system now requires COVID-19 testing prior to any scheduled hospital procedure. Please go to our [facility] location drive thru testing site ([address], [place], [place] [address]) on Saturday 03/21/20 at  12 pm. There will be multiple testing areas, the first checkpoint being for pre-procedure/surgery testing. Get into the right (yellow) lane that leads to the PAT testing team. You will not be billed at the time of testing but may receive a bill later depending on your insurance. The approximate cost of the test is $100. You must agree to quarantine from the time of your testing until the procedure date on 03/25/20 . This should include staying at home with ONLY the people you live with. Avoid take-out, grocery store shopping or leaving the house for any non-emergent reason. Failure to have your COVID-19 test done on the date and time you have been scheduled will result in cancellation of procedure. Please call our office at [phone] if you have any questions.    You have been scheduled to have an anorectal manometry at [facility] on Wednesday 03/25/20 at 12 pm. Please arrive 30 minutes prior to your appointment time for registration (1st  floor of the hospital-admissions).  Please make certain to use 1 Fleets enema 2 hours prior to coming for your appointment. You can purchase Fleets enemas from the laxative section at your drug store. You should not eat anything during the two hours prior to the procedure. You may take regular medications with small sips of water at least 2 hours prior to the study.  Anorectal manometry is a test performed to evaluate patients with constipation or fecal incontinence. This test measures the pressures of the anal sphincter muscles, the sensation in the rectum, and the neural reflexes that are needed for normal bowel movements.  THE PROCEDURE The test takes approximately 30 minutes to 1 hour. You will be asked to change into a hospital gown. A technician or nurse will explain the procedure to you, take a brief health history, and answer any questions you may have. The patient then lies on his or her left side. A small, flexible tube, about the size of a thermometer, with a balloon at the end is inserted into the rectum. The catheter is connected to a machine that measures the pressure. During the test, the small balloon attached to the catheter may be inflated in the rectum to assess the normal reflex pathways. The nurse or technician may also ask the person to squeeze, relax, and push at various times. The anal sphincter muscle pressures are measured during each of these maneuvers. To squeeze, the patient tightens the sphincter muscles as if trying to prevent anything from  coming out. To push or bear down, the patient strains down as if trying to have a bowel movement.

## 2020-02-18 NOTE — Progress Notes (Signed)
Reviewed and agree with documentation and assessment and plan. K. Veena Zury Fazzino , MD   

## 2020-03-06 ENCOUNTER — Telehealth: Payer: Self-pay | Admitting: Gastroenterology

## 2020-03-06 MED ORDER — ONDANSETRON HCL 4 MG PO TABS: 4.0000 mg | ORAL_TABLET | Freq: Three times a day (TID) | ORAL | 0 refills | Status: DC | PRN

## 2020-03-06 NOTE — Telephone Encounter (Signed)
Left message on machine to call back  

## 2020-03-06 NOTE — Telephone Encounter (Signed)
Patient called states she is experiencing a lot of nausea please advise

## 2020-03-06 NOTE — Telephone Encounter (Signed)
We can give her a small amount of Zofran like 15 or 20 pills with no refills.  I am not really sure why she has this nausea, but it should probably be addressed further by her PCP and I would also like her to come back for follow-up with Dr. Lavon Paganini as well so if it still continues she can address it with her at that time.  Please make her a follow-up with Dr. Lavon Paganini at her next available.  Thank you,  Jess

## 2020-03-06 NOTE — Telephone Encounter (Signed)
The pt has been advised and prescription has been sent to the pharmacy.  She will call back to make appt with Dr Lavon Paganini and she will call PCP.

## 2020-03-06 NOTE — Telephone Encounter (Signed)
Shannon Sharp the pt states she failed to mention that she has nausea and vomiting. She would like to have something for nausea.  Please advise

## 2020-03-10 ENCOUNTER — Telehealth: Payer: Self-pay | Admitting: Gastroenterology

## 2020-03-10 NOTE — Telephone Encounter (Signed)
The pt has an appt with Dr Lavon Paganini on 5/14.  She was advised to keep that as planned and also call her PCP as instructed by Doug Sou.  The pt has been advised of the information and verbalized understanding.

## 2020-03-13 ENCOUNTER — Telehealth: Payer: Self-pay | Admitting: Gastroenterology

## 2020-03-13 ENCOUNTER — Encounter: Payer: Self-pay | Admitting: Gastroenterology

## 2020-03-13 ENCOUNTER — Ambulatory Visit (INDEPENDENT_AMBULATORY_CARE_PROVIDER_SITE_OTHER): Payer: Medicare Other | Admitting: Gastroenterology

## 2020-03-13 VITALS — BP 140/70 | HR 83 | Temp 98.7°F | Ht 66.0 in | Wt 282.0 lb

## 2020-03-13 DIAGNOSIS — R110 Nausea: Secondary | ICD-10-CM

## 2020-03-13 DIAGNOSIS — K9089 Other intestinal malabsorption: Secondary | ICD-10-CM

## 2020-03-13 MED ORDER — COLESTIPOL HCL 1 G PO TABS: 1.0000 g | ORAL_TABLET | Freq: Two times a day (BID) | ORAL | 11 refills | Status: DC

## 2020-03-13 MED ORDER — FAMOTIDINE 40 MG PO TABS: 40.0000 mg | ORAL_TABLET | Freq: Every day | ORAL | 11 refills | Status: AC | PRN

## 2020-03-13 NOTE — Progress Notes (Signed)
Shannon Sharp    [id number]    12-17-1965  Primary Care Physician:Husain, Jerelyn Scott, MD  Referring Physician: Georgann Housekeeper, MD [address] [place],  [place] [address]   Chief complaint: Nausea HPI:  54 year old female for follow-up visit for nausea  Nausea worse in the past 1 to 2 weeks.  No new medications other than cholestyramine.  Denies any heartburn, dysphagia, odynophagia, abdominal pain, melena or blood per rectum She is having 1 bowel movement on average every other day, no longer has diarrhea or fecal incontinence.  She was seen by Shanda Bumps on February 12, 2020 for fecal incontinence.  She has 1 loose bowel movement per day on average, has fecal leakage and seepage.  She was started on cholestyramine by Shanda Bumps at last visit.  She had used Benefiber in the past with no improvement  Esophageal manometry March 2019: Normal relaxation of the EG junction with ineffective esophageal motility  24-hour pH study: Showed no evidence of significant gastroesophageal acid reflux  Colonoscopy January 2019: Showed internal hemorrhoids otherwise unremarkable exam.  EGD January 2019: Normal   Outpatient Encounter Medications as of 03/13/2020  Medication Sig  . amLODipine (NORVASC) 5 MG tablet 1 tablet daily.  . cholestyramine (QUESTRAN) 4 g packet Take 1 packet (4 g total) by mouth daily.  . clonazePAM (KLONOPIN) 1 MG tablet Take 1 tablet by mouth 2 (two) times daily.   Marland Kitchen escitalopram (LEXAPRO) 10 MG tablet Take 10 mg by mouth every morning.  . irbesartan-hydrochlorothiazide (AVALIDE) 300-12.5 MG tablet 1 tablet daily.  . ondansetron (ZOFRAN) 4 MG tablet Take 1 tablet (4 mg total) by mouth every 8 (eight) hours as needed for nausea or vomiting.  . tolterodine (DETROL LA) 4 MG 24 hr capsule Take 4 mg by mouth daily.  Marland Kitchen topiramate (TOPAMAX) 100 MG tablet Take 300 mg by mouth daily after supper.   . TRAZODONE HCL PO Take 75 mg by mouth daily.  .  ziprasidone (GEODON) 60 MG capsule Take 60 mg by mouth at bedtime.  . [DISCONTINUED] albuterol (PROVENTIL HFA;VENTOLIN HFA) 108 (90 Base) MCG/ACT inhaler Inhale 2 puffs into the lungs every 4 (four) hours as needed for wheezing or shortness of breath.  . [DISCONTINUED] hydrocortisone 2.5 % cream Apply 1 application topically as needed (For eczema.).   . [DISCONTINUED] ondansetron (ZOFRAN) 8 MG tablet Take 8 mg by mouth 2 (two) times daily.   No facility-administered encounter medications on file as of 03/13/2020.    Allergies as of 03/13/2020 - Review Complete 03/13/2020  Allergen Reaction Noted  . Penicillins Itching and Other (See Comments) 02/22/2012  . Sulfa antibiotics Swelling 02/22/2012    Past Medical History:  Diagnosis Date  . Allergy   . Anxiety   . Bipolar 2 disorder (HCC)   . Cholelithiasis 02/2017  . DDD (degenerative disc disease), cervical   . Depression   . Diarrhea   . Fecal incontinence   . GERD (gastroesophageal reflux disease)   . HTN (hypertension)   . Internal hemorrhoids 10/2017   noted on colonoscopy  . Leg swelling   . Morbid obesity (HCC)   . OA (osteoarthritis)   . OSA on CPAP   . Peristalsis 12/2017   weak and frequent failed noted on esophageal manometry  . Seizure (HCC) 02/2018  . Urinary incontinence   . UTI (urinary tract infection) 04/10/2018   Hospitalized    Past  Surgical History:  Procedure Laterality Date  . [place] STUDY N/A 01/08/2018   Procedure: [place] STUDY;  Surgeon: Mauri Pole, MD;  Location: [facility];  Service: Endoscopy;  Laterality: N/A;  . COLONOSCOPY  11/08/2017  . ESOPHAGEAL MANOMETRY N/A 01/08/2018   Procedure: ESOPHAGEAL MANOMETRY (EM);  Surgeon: Mauri Pole, MD;  Location: [facility];  Service: Endoscopy;  Laterality: N/A;  . ESOPHAGOGASTRODUODENOSCOPY ENDOSCOPY  11/08/2017    Family History  Problem Relation Age of Onset  . Hypertension Mother   . Hypertension Father   . Diabetes  Paternal Aunt   . Diabetes Paternal Uncle   . Colon cancer Neg Hx   . Stomach cancer Neg Hx   . Pancreatic cancer Neg Hx     Social History   Socioeconomic History  . Marital status: Single    Spouse name: Not on file  . Number of children: 2  . Years of education: 69  . Highest education level: Not on file  Occupational History  . Occupation: Disability  Tobacco Use  . Smoking status: Never Smoker  . Smokeless tobacco: Never Used  Substance and Sexual Activity  . Alcohol use: Yes  . Drug use: No  . Sexual activity: Yes    Birth control/protection: I.U.D.  Other Topics Concern  . Not on file  Social History Narrative   Fun/Hobby: Attend classes at the [employer]; working on going to the gym.    Social Determinants of Health   Financial Resource Strain:   . Difficulty of Paying Living Expenses:   Food Insecurity:   . Worried About Charity fundraiser in the Last Year:   . Arboriculturist in the Last Year:   Transportation Needs:   . Film/video editor (Medical):   Marland Kitchen Lack of Transportation (Non-Medical):   Physical Activity:   . Days of Exercise per Week:   . Minutes of Exercise per Session:   Stress:   . Feeling of Stress :   Social Connections:   . Frequency of Communication with Friends and Family:   . Frequency of Social Gatherings with Friends and Family:   . Attends Religious Services:   . Active Member of Clubs or Organizations:   . Attends Archivist Meetings:   Marland Kitchen Marital Status:   Intimate Partner Violence:   . Fear of Current or Ex-Partner:   . Emotionally Abused:   Marland Kitchen Physically Abused:   . Sexually Abused:       Review of systems:  All other review of systems negative except as mentioned in the HPI.   Physical Exam: Vitals:   03/13/20 0956  BP: 140/70  Pulse: 83  Temp: 98.7 F (37.1 C)   Body mass index is 45.52 kg/m. Gen:      No acute distress Neuro: alert and oriented x 3 Psych: normal mood and  affect  Data Reviewed:  Reviewed labs, radiology imaging, old records and pertinent past GI work up   Assessment and Plan/Recommendations:  54 year old female with esophageal dysmotility, diarrhea and fecal incontinence here with complaints of nausea  Her symptoms of nausea started after she started she started taking cholestyramine, no other medication changes. Bowel habits have significantly improved and no longer having fecal incontinence since she started taking cholestyramine Switch to Colestid 1 g twice daily Pepcid 40 mg daily as needed for reflux related symptoms  Return in 3 months or sooner if needed  This visit required 30 minutes  of patient care (this includes precharting, chart review, review of results, face-to-face time used for counseling as well as treatment plan and follow-up. The patient was provided an opportunity to ask questions and all were answered. The patient agreed with the plan and demonstrated an understanding of the instructions.  Iona Beard , MD    CC: Georgann Housekeeper, MD

## 2020-03-13 NOTE — Patient Instructions (Addendum)
If you are age 55 or older, your body mass index should be between 23-30. Your Body mass index is 45.52 kg/m. If this is out of the aforementioned range listed, please consider follow up with your Primary Care Provider.  If you are age 20 or younger, your body mass index should be between 19-25. Your Body mass index is 45.52 kg/m. If this is out of the aformentioned range listed, please consider follow up with your Primary Care Provider.   Please STOP: chloestyramine  We have sent the following medications to your pharmacy for you to pick up at your convenience:  START: Colestid 1 gram twice daily  START: Pepcid 40mg  one tablet daily as needed.  You will be seen in 3 months (August 2021) for follow up appointment.  I appreciate the opportunity to care for you. Thank you for choosing me and [facility],  Dr. 01-14-2004

## 2020-03-13 NOTE — Telephone Encounter (Signed)
Joesph at pharmacy states that patient can't get pepcid filled until July 2021 because she has had it filled at another pharmacy recently.  Left message on patient's machine to return call to let her know she can have filled as cash price for $19.28.  Will continue efforts.

## 2020-03-13 NOTE — Telephone Encounter (Signed)
Patient returned call and was informed that she can pay cash for famotidine.  She states she has already picked up at pharmacy.  She has been taking Pepcid for a few weeks that was prescribed by another physician.  She feels it is making her dizzy.  Dr Lavon Paganini was made aware, and does not feel Pepcid is causing her dizziness. Patient advised per Dr Elana Alm verbal order to hold off on Pepcid for several days to see of colestid would help with her nausea.  Patient agreed to plan and verbalized understanding.

## 2020-03-17 ENCOUNTER — Encounter: Payer: Self-pay | Admitting: Gastroenterology

## 2020-03-21 ENCOUNTER — Inpatient Hospital Stay (HOSPITAL_COMMUNITY): Admission: RE | Admit: 2020-03-21 | Payer: Medicare Other | Source: Ambulatory Visit

## 2020-03-24 ENCOUNTER — Telehealth: Payer: Self-pay

## 2020-03-24 NOTE — Telephone Encounter (Signed)
Patient did not go for the required COVID testing. Her anal manometry will have to be canceled. I have tried calling her number twice. No answer. The voicemail is full and cannot accept messages. The patient is not using the My Chart. I tried calling the phone number listed as her mother and emergency contact. No answer. No voicemail.

## 2020-03-25 ENCOUNTER — Other Ambulatory Visit: Payer: Self-pay | Admitting: Internal Medicine

## 2020-03-25 ENCOUNTER — Ambulatory Visit (HOSPITAL_COMMUNITY): Admission: RE | Admit: 2020-03-25 | Payer: Medicare Other | Source: Home / Self Care | Admitting: Gastroenterology

## 2020-03-25 ENCOUNTER — Encounter (HOSPITAL_COMMUNITY): Admission: RE | Payer: Self-pay | Source: Home / Self Care

## 2020-03-25 DIAGNOSIS — Z1231 Encounter for screening mammogram for malignant neoplasm of breast: Secondary | ICD-10-CM

## 2020-03-25 SURGERY — MANOMETRY, ANORECTAL

## 2020-03-25 NOTE — Telephone Encounter (Signed)
Appointment was cancelled.

## 2020-03-25 NOTE — Progress Notes (Signed)
Attempted to call patient to confirm that manometry is cancelled for today due to not getting covid test. Unable to leave message due to mailbox full and no one answered phone.

## 2020-04-09 ENCOUNTER — Ambulatory Visit: Payer: Medicare Other

## 2020-04-16 ENCOUNTER — Telehealth: Payer: Self-pay | Admitting: Gastroenterology

## 2020-04-16 MED ORDER — ONDANSETRON HCL 4 MG PO TABS: 4.0000 mg | ORAL_TABLET | Freq: Three times a day (TID) | ORAL | 1 refills | Status: DC | PRN

## 2020-04-16 NOTE — Telephone Encounter (Signed)
Patient aware that refills sent of Zofran to pharmacy

## 2020-04-16 NOTE — Telephone Encounter (Signed)
Patient calling for refill on Zofran

## 2020-05-29 ENCOUNTER — Telehealth: Payer: Self-pay | Admitting: Gastroenterology

## 2020-05-29 NOTE — Telephone Encounter (Signed)
Left message on machine to call back  

## 2020-06-02 ENCOUNTER — Other Ambulatory Visit: Payer: Self-pay

## 2020-06-02 MED ORDER — CHOLESTYRAMINE 4 GM/DOSE PO POWD: 4.0000 g | Freq: Two times a day (BID) | ORAL | 2 refills | Status: DC

## 2020-06-02 MED ORDER — COLESTIPOL HCL 1 G PO TABS: 4.0000 g | ORAL_TABLET | Freq: Two times a day (BID) | ORAL | 1 refills | Status: DC

## 2020-06-02 NOTE — Telephone Encounter (Signed)
Spoke with the patient. She reports she again does not have control over her bowel movements. She will feel the leakage occur. She rushes to the bathroom, but will not make it before she is soiled. She finishes emptying her bowels on the toilet. This happens about 3 times a day. The bowel movement is liquid. She does not have any abdominal pain, no bloody stools, not associated with eating and she denies nausea. She does not want to reschedule the anal manometry. She has continued taking colestipol 1 gram BID. Appointment for October is scheduled. Offered earlier appointments with an APP, but she is restricted by her hours of employment and unable to accept these available times. The patient wants to know if she can try an increased dosage or another medication for her diarrhea stools until the appointment? Thanks

## 2020-06-02 NOTE — Telephone Encounter (Signed)
Called the patient. Left her a message. Does she have any other symptoms such as urgency or abdominal pain? Does she want me to reschedule the manometry test or would she want to come back to the office to see Shanda Bumps or Dr Lavon Paganini?

## 2020-06-02 NOTE — Telephone Encounter (Signed)
Spoke with the patient and advised her on the medication. Sent the Choleystramine Rx in as directed. Cancelled the Colestipol Rx.

## 2020-06-02 NOTE — Telephone Encounter (Signed)
Please change Rx back to Cholestyramine  4gm twice daily titrate the dose based on response. She had good response to it in the past.

## 2020-06-02 NOTE — Telephone Encounter (Signed)
Patient called to follow up on previous message also said if she does not answer because she is at work to leave her a detailed message.

## 2020-07-05 ENCOUNTER — Other Ambulatory Visit: Payer: Self-pay

## 2020-07-05 ENCOUNTER — Ambulatory Visit (HOSPITAL_COMMUNITY)
Admission: EM | Admit: 2020-07-05 | Discharge: 2020-07-05 | Disposition: A | Discharge status: Home / Self Care | Payer: Medicare Other | Attending: Family Medicine | Admitting: Family Medicine

## 2020-07-05 ENCOUNTER — Encounter (HOSPITAL_COMMUNITY): Payer: Self-pay

## 2020-07-05 DIAGNOSIS — G4733 Obstructive sleep apnea (adult) (pediatric): Secondary | ICD-10-CM | POA: Diagnosis not present

## 2020-07-05 DIAGNOSIS — M1990 Unspecified osteoarthritis, unspecified site: Secondary | ICD-10-CM | POA: Insufficient documentation

## 2020-07-05 DIAGNOSIS — Z20822 Contact with and (suspected) exposure to covid-19: Secondary | ICD-10-CM | POA: Diagnosis not present

## 2020-07-05 DIAGNOSIS — Z882 Allergy status to sulfonamides status: Secondary | ICD-10-CM | POA: Insufficient documentation

## 2020-07-05 DIAGNOSIS — Z8719 Personal history of other diseases of the digestive system: Secondary | ICD-10-CM | POA: Diagnosis not present

## 2020-07-05 DIAGNOSIS — D509 Iron deficiency anemia, unspecified: Secondary | ICD-10-CM | POA: Insufficient documentation

## 2020-07-05 DIAGNOSIS — F3181 Bipolar II disorder: Secondary | ICD-10-CM | POA: Insufficient documentation

## 2020-07-05 DIAGNOSIS — Z8249 Family history of ischemic heart disease and other diseases of the circulatory system: Secondary | ICD-10-CM | POA: Diagnosis not present

## 2020-07-05 DIAGNOSIS — N183 Chronic kidney disease, stage 3 unspecified: Secondary | ICD-10-CM | POA: Insufficient documentation

## 2020-07-05 DIAGNOSIS — K2100 Gastro-esophageal reflux disease with esophagitis, without bleeding: Secondary | ICD-10-CM | POA: Diagnosis not present

## 2020-07-05 DIAGNOSIS — I129 Hypertensive chronic kidney disease with stage 1 through stage 4 chronic kidney disease, or unspecified chronic kidney disease: Secondary | ICD-10-CM | POA: Insufficient documentation

## 2020-07-05 DIAGNOSIS — Z87440 Personal history of urinary (tract) infections: Secondary | ICD-10-CM | POA: Diagnosis not present

## 2020-07-05 DIAGNOSIS — J22 Unspecified acute lower respiratory infection: Secondary | ICD-10-CM | POA: Diagnosis present

## 2020-07-05 DIAGNOSIS — Z880 Allergy status to penicillin: Secondary | ICD-10-CM | POA: Diagnosis not present

## 2020-07-05 DIAGNOSIS — G4700 Insomnia, unspecified: Secondary | ICD-10-CM | POA: Insufficient documentation

## 2020-07-05 DIAGNOSIS — Z79899 Other long term (current) drug therapy: Secondary | ICD-10-CM | POA: Diagnosis not present

## 2020-07-05 DIAGNOSIS — F410 Panic disorder [episodic paroxysmal anxiety] without agoraphobia: Secondary | ICD-10-CM | POA: Diagnosis not present

## 2020-07-05 LAB — SARS CORONAVIRUS 2 (TAT 6-24 HRS): SARS Coronavirus 2: NEGATIVE

## 2020-07-05 MED ORDER — BENZONATATE 100 MG PO CAPS: 100.0000 mg | ORAL_CAPSULE | Freq: Three times a day (TID) | ORAL | 0 refills | Status: DC | PRN

## 2020-07-05 MED ORDER — AZITHROMYCIN 250 MG PO TABS: ORAL_TABLET | ORAL | 0 refills | Status: AC

## 2020-07-05 NOTE — Discharge Instructions (Signed)
Push fluids to ensure adequate hydration and keep secretions thin.  Tylenol and/or ibuprofen as needed for pain or fevers.  Complete course of antibiotics.  Bland diet as tolerated.  May try tessalon as needed for cough.  Continue with over the counter medications as needed for symptoms.  Self isolate until covid results are back and negative.  Will notify you by phone of any positive findings. Your negative results will be sent through your MyChart.    If symptoms worsen or do not improve in the next week to return to be seen or to follow up with your PCP.

## 2020-07-05 NOTE — ED Triage Notes (Addendum)
Pt states cough and congestion x 1 week. Pt states productive cough with clear mucus. Pt has no fever and is ambulatory. Pt ao x 4.   Pt states had a covid test on Thursday thru drive thru. Uncertain if rapid or PCR.

## 2020-07-05 NOTE — ED Provider Notes (Signed)
[facility]    CSN: [id number] Arrival date & time: 07/05/20  1018      History   Chief Complaint Chief Complaint  Patient presents with  . Cough    x 1 week    HPI Shannon Sharp is a 54 y.o. female.   Shannon Sharp presents with complaints of cough and congestion. Diarrhea as well. Symptoms started 1.5 week ago. Worsening. No shortness of breath . Cough is productive of white mucus. No fevers. No nausea or vomiting. No sore throat or ear pain. No headache or body aches. Diarrhea over the past few days, no blood or black to stool, no abdominal pain. No known ill contacts. No asthma or COPD, doesn't smoke. Has been guaifenesin d which only somewhat helps. Denies any previous similar. Tested on Thursday for covid- which was negative, received results same day.\   ROS per HPI, negative if not otherwise mentioned.       Past Medical History:  Diagnosis Date  . Allergy   . Anxiety   . Bipolar 2 disorder (HCC)   . Cholelithiasis 02/2017  . DDD (degenerative disc disease), cervical   . Depression   . Diarrhea   . Fecal incontinence   . GERD (gastroesophageal reflux disease)   . HTN (hypertension)   . Internal hemorrhoids 10/2017   noted on colonoscopy  . Leg swelling   . Morbid obesity (HCC)   . OA (osteoarthritis)   . OSA on CPAP   . Peristalsis 12/2017   weak and frequent failed noted on esophageal manometry  . Seizure (HCC) 02/2018  . Urinary incontinence   . UTI (urinary tract infection) 04/10/2018   Hospitalized    Patient Active Problem List   Diagnosis Date Noted  . Incontinence of feces 02/12/2020  . CKD (chronic kidney disease), stage III 04/11/2018  . Bacteremia 04/10/2018  . Acute lower UTI 04/10/2018  . Heartburn   . Dysphagia   . Seasonal allergic rhinitis 05/08/2017  . Gastroesophageal reflux disease with esophagitis 05/08/2017  . Urinary frequency 03/02/2017  . Acute pain of both knees 03/02/2017  . Class 3 severe  obesity due to excess calories without serious comorbidity with body mass index (BMI) of 45.0 to 49.9 in adult (HCC) 03/02/2017  . OBESITY, NOS 12/28/2006  . ANEMIA, IRON DEFICIENCY, UNSPEC. 12/28/2006  . DEPRESSION, MAJOR, RECURRENT 12/28/2006  . Bipolar 2 disorder (HCC) 12/28/2006  . ANXIETY 12/28/2006  . PANIC ATTACKS 12/28/2006  . OBSESSIVE COMPUL. DISORDER 12/28/2006  . ATTENTION DEFICIT, W/HYPERACTIVITY 12/28/2006  . HEARING LOSS NOS OR DEAFNESS 12/28/2006  . Atopic dermatitis 12/28/2006  . INSOMNIA NOS 12/28/2006    Past Surgical History:  Procedure Laterality Date  . 24 HOUR PH STUDY N/A 01/08/2018   Procedure: 24 HOUR PH STUDY;  Surgeon: Napoleon Form, MD;  Location: [facility];  Service: Endoscopy;  Laterality: N/A;  . COLONOSCOPY  11/08/2017  . ESOPHAGEAL MANOMETRY N/A 01/08/2018   Procedure: ESOPHAGEAL MANOMETRY (EM);  Surgeon: Napoleon Form, MD;  Location: [facility];  Service: Endoscopy;  Laterality: N/A;  . ESOPHAGOGASTRODUODENOSCOPY ENDOSCOPY  11/08/2017    OB History   No obstetric history on file.      Home Medications    Prior to Admission medications   Medication Sig Start Date End Date Taking? Authorizing Provider  atorvastatin (LIPITOR) 10 MG tablet Take 10 mg by mouth daily.   Yes [provider]  cholestyramine Lanetta Inch) 4 GM/DOSE powder Take 1 packet (4 g total)  by mouth 2 (two) times daily with a meal. 06/02/20 07/05/20 Yes Nandigam, Eleonore Chiquito, MD  clonazePAM (KLONOPIN) 1 MG tablet Take 1 tablet by mouth 2 (two) times daily.  12/20/17  Yes [provider]  escitalopram (LEXAPRO) 10 MG tablet Take 10 mg by mouth every morning. 01/14/20  Yes [provider]  ondansetron (ZOFRAN) 4 MG tablet Take 1 tablet (4 mg total) by mouth every 8 (eight) hours as needed for nausea or vomiting. 04/16/20  Yes Nandigam, Kavitha V, MD  tolterodine (DETROL LA) 4 MG 24 hr capsule Take 4 mg by mouth daily. 11/30/17  Yes [provider]  topiramate (TOPAMAX) 100 MG tablet Take 300 mg by mouth daily after supper.  11/16/17  Yes [provider]  TRAZODONE HCL PO Take 75 mg by mouth daily.   Yes [provider]  amLODipine (NORVASC) 5 MG tablet 1 tablet daily.    [provider]  azithromycin (ZITHROMAX) 250 MG tablet Take 2 tablets (500 mg total) by mouth daily for 1 day, THEN 1 tablet (250 mg total) daily for 4 days. 07/05/20 07/10/20  Georgetta Haber, NP  benzonatate (TESSALON) 100 MG capsule Take 1 capsule (100 mg total) by mouth 3 (three) times daily as needed for cough. 07/05/20   Georgetta Haber, NP  famotidine (PEPCID) 40 MG tablet Take 1 tablet (40 mg total) by mouth daily as needed for heartburn or indigestion. 03/13/20   Napoleon Form, MD  irbesartan-hydrochlorothiazide (AVALIDE) 300-12.5 MG tablet 1 tablet daily.    [provider]  ziprasidone (GEODON) 60 MG capsule Take 60 mg by mouth at bedtime.    [provider]    Family History Family History  Problem Relation Age of Onset  . Hypertension Mother   . Hypertension Father   . Diabetes Paternal Aunt   . Diabetes Paternal Uncle   . Colon cancer Neg Hx   . Stomach cancer Neg Hx   . Pancreatic cancer Neg Hx     Social History Social History   Tobacco Use  . Smoking status: Never Smoker  . Smokeless tobacco: Never Used  Vaping Use  . Vaping Use: Never used  Substance Use Topics  . Alcohol use: Not Currently  . Drug use: No     Allergies   Penicillins and Sulfa antibiotics   Review of Systems Review of Systems   Physical Exam Triage Vital Signs ED Triage Vitals  Enc Vitals Group     BP 07/05/20 1147 (!) 144/102     Pulse Rate 07/05/20 1147 62     Resp 07/05/20 1147 18     Temp 07/05/20 1147 98.5 F (36.9 C)     Temp Source 07/05/20 1147 Oral     SpO2 07/05/20 1147 99 %     Weight --      Height --      Head Circumference --      Peak Flow --      Pain Score 07/05/20 1139 0      Pain Loc --      Pain Edu? --      Excl. in GC? --    No data found.  Updated Vital Signs BP (!) 144/102 (BP Location: Right Arm)   Pulse 62   Temp 98.5 F (36.9 C) (Oral)   Resp 18   SpO2 99%    Physical Exam Constitutional:      General: She is not in acute distress.  Appearance: She is well-developed.  Cardiovascular:     Rate and Rhythm: Normal rate.  Pulmonary:     Effort: Pulmonary effort is normal. No respiratory distress.     Breath sounds: Decreased breath sounds present. No wheezing.  Skin:    General: Skin is warm and dry.  Neurological:     Mental Status: She is alert and oriented to person, place, and time.      UC Treatments / Results  Labs (all labs ordered are listed, but only abnormal results are displayed) Labs Reviewed  SARS CORONAVIRUS 2 (TAT 6-24 HRS)    EKG   Radiology No results found.  Procedures Procedures (including critical care time)  Medications Ordered in UC Medications - No data to display  Initial Impression / Assessment and Plan / UC Course  I have reviewed the triage vital signs and the nursing notes.  Pertinent labs & imaging results that were available during my care of the patient were reviewed by me and considered in my medical decision making (see chart for details).     No work of breathing. No cough throughout exam. Afebrile. 1.5 week of symptoms which are worsening. Azithromycin provided. Confirmation covid testing collected and pending. Return precautions provided. Patient verbalized understanding and agreeable to plan.   Final Clinical Impressions(s) / UC Diagnoses   Final diagnoses:  Lower respiratory infection     Discharge Instructions     Push fluids to ensure adequate hydration and keep secretions thin.  Tylenol and/or ibuprofen as needed for pain or fevers.  Complete course of antibiotics.  Bland diet as tolerated.  May try tessalon as needed for cough.  Continue with over the counter  medications as needed for symptoms.  Self isolate until covid results are back and negative.  Will notify you by phone of any positive findings. Your negative results will be sent through your MyChart.    If symptoms worsen or do not improve in the next week to return to be seen or to follow up with your PCP.      ED Prescriptions    Medication Sig Dispense Auth. Provider   azithromycin (ZITHROMAX) 250 MG tablet Take 2 tablets (500 mg total) by mouth daily for 1 day, THEN 1 tablet (250 mg total) daily for 4 days. 6 tablet Linus Mako B, NP   benzonatate (TESSALON) 100 MG capsule Take 1 capsule (100 mg total) by mouth 3 (three) times daily as needed for cough. 21 capsule Georgetta Haber, NP     PDMP not reviewed this encounter.   Georgetta Haber, NP 07/05/20 1220

## 2020-08-03 ENCOUNTER — Ambulatory Visit: Payer: Medicare Other | Admitting: Gastroenterology

## 2020-08-13 ENCOUNTER — Ambulatory Visit
Admission: RE | Admit: 2020-08-13 | Discharge: 2020-08-13 | Disposition: A | Discharge status: Home / Self Care | Payer: Medicare Other | Source: Ambulatory Visit | Attending: Internal Medicine | Admitting: Internal Medicine

## 2020-08-13 ENCOUNTER — Other Ambulatory Visit: Payer: Self-pay | Admitting: Internal Medicine

## 2020-08-13 DIAGNOSIS — R059 Cough, unspecified: Secondary | ICD-10-CM

## 2020-08-28 ENCOUNTER — Other Ambulatory Visit: Payer: Self-pay | Admitting: Gastroenterology

## 2020-08-28 ENCOUNTER — Telehealth: Payer: Self-pay | Admitting: Gastroenterology

## 2020-08-28 MED ORDER — ONDANSETRON HCL 4 MG PO TABS: 4.0000 mg | ORAL_TABLET | Freq: Three times a day (TID) | ORAL | 1 refills | Status: DC | PRN

## 2020-08-28 NOTE — Telephone Encounter (Signed)
Zofran sent to patient's pharmacy

## 2020-08-28 NOTE — Addendum Note (Signed)
Addended by: Marlowe Kays on: 08/28/2020 02:01 PM   Modules accepted: Orders

## 2020-08-28 NOTE — Telephone Encounter (Signed)
Pt is requesting a refill on her ZOFRAN.

## 2020-09-04 ENCOUNTER — Ambulatory Visit: Payer: Medicare Other | Admitting: Gastroenterology

## 2020-09-17 ENCOUNTER — Ambulatory Visit (INDEPENDENT_AMBULATORY_CARE_PROVIDER_SITE_OTHER): Payer: Medicare Other | Admitting: Physician Assistant

## 2020-09-17 ENCOUNTER — Encounter: Payer: Self-pay | Admitting: Physician Assistant

## 2020-09-17 VITALS — BP 108/60 | HR 76 | Ht 66.0 in | Wt 259.0 lb

## 2020-09-17 DIAGNOSIS — R159 Full incontinence of feces: Secondary | ICD-10-CM | POA: Diagnosis not present

## 2020-09-17 DIAGNOSIS — R112 Nausea with vomiting, unspecified: Secondary | ICD-10-CM | POA: Diagnosis not present

## 2020-09-17 MED ORDER — ONDANSETRON HCL 8 MG PO TABS: 8.0000 mg | ORAL_TABLET | Freq: Three times a day (TID) | ORAL | 5 refills | Status: AC | PRN

## 2020-09-17 NOTE — Patient Instructions (Signed)
If you are age 54 or older, your body mass index should be between 23-30. Your Body mass index is 41.8 kg/m. If this is out of the aforementioned range listed, please consider follow up with your Primary Care Provider.  If you are age 35 or younger, your body mass index should be between 19-25. Your Body mass index is 41.8 kg/m. If this is out of the aformentioned range listed, please consider follow up with your Primary Care Provider.   We have sent the following medications to your pharmacy for you to pick up at your convenience: Zofran 8 mg take one at lunch and then one every 4-6 hours as needed.  Start separating cholestyramine take one dose in the morning with food and the other dose with dinner.   Thank you for choosing me and [facility].  Hyacinth Meeker , PA-C

## 2020-09-17 NOTE — Progress Notes (Signed)
Chief Complaint: Fecal incontinence, nausea and vomiting  HPI:    Shannon Sharp is a 54 year old African-American female with a past medical history as listed below, known to Dr. Lavon Paganini, who was referred to me by Georgann Housekeeper, MD for follow-up of fecal incontinence and nausea and vomiting.      11/08/2017 colonoscopy Dr. Lavon Paganini with nonbleeding internal hemorrhoids and otherwise normal.  Repeat recommended in 10 years.    03/13/2020 office visit with Dr. Lavon Paganini and was following up with nausea, is worse over the past 1 to 2 weeks.  At that time was only having 1 bowel movement on average every other day and no longer had diarrhea or fecal incontinence.  She had previously seen [facility] February 12, 2020 for fecal incontinence and had 1 loose bowel movement per day on average with fecal leakage and seepage.  She is started on cholestyramine and had done well.  Apparently her symptoms of nausea had started after she started taking cholestyramine she was switched to Colestid 1 g twice daily.    05/29/2020 patient called and described more fecal incontinence.  She was changed back to cholestyramine 4 g twice daily.    Today, the patient explains that she has only about "1 slip up" a month of fecal incontinence.  It is still a soft formed stool.  Tells me this is much better with the Cholestyramine 4 g twice a day but currently she is taking this at lunch and dinner with food because she does not typically eat breakfast.  Describes that her nausea was some better when she was not on this medicine but her fecal incontinence was worse and she would rather that were under control.    Tells me that her nausea occurs maybe 3 times a week and she will vomit maybe 3 times out of the month.  She does use her Zofran 4 mg as needed when she becomes nauseous but she does not feel like this dosage really works well for her.    Denies fever, chills or blood in her stool.     Past Medical History:  Diagnosis Date  .  Allergy   . Anxiety   . Bipolar 2 disorder (HCC)   . Cholelithiasis 02/2017  . DDD (degenerative disc disease), cervical   . Depression   . Diarrhea   . Fecal incontinence   . GERD (gastroesophageal reflux disease)   . HTN (hypertension)   . Internal hemorrhoids 10/2017   noted on colonoscopy  . Leg swelling   . Morbid obesity (HCC)   . OA (osteoarthritis)   . OSA on CPAP   . Peristalsis 12/2017   weak and frequent failed noted on esophageal manometry  . Seizure (HCC) 02/2018  . Urinary incontinence   . UTI (urinary tract infection) 04/10/2018   Hospitalized    Past Surgical History:  Procedure Laterality Date  . 24 HOUR PH STUDY N/A 01/08/2018   Procedure: 24 HOUR PH STUDY;  Surgeon: Napoleon Form, MD;  Location: [facility];  Service: Endoscopy;  Laterality: N/A;  . COLONOSCOPY  11/08/2017  . ESOPHAGEAL MANOMETRY N/A 01/08/2018   Procedure: ESOPHAGEAL MANOMETRY (EM);  Surgeon: Napoleon Form, MD;  Location: [facility];  Service: Endoscopy;  Laterality: N/A;  . ESOPHAGOGASTRODUODENOSCOPY ENDOSCOPY  11/08/2017    Current Outpatient Medications  Medication Sig Dispense Refill  . amLODipine (NORVASC) 5 MG tablet 1 tablet daily.    Marland Kitchen atorvastatin (LIPITOR) 10 MG tablet Take 10 mg by mouth daily.    Marland Kitchen  benzonatate (TESSALON) 100 MG capsule Take 1 capsule (100 mg total) by mouth 3 (three) times daily as needed for cough. 21 capsule 0  . cholestyramine (QUESTRAN) 4 GM/DOSE powder Take 1 packet (4 g total) by mouth 2 (two) times daily with a meal. 60 packet 2  . clonazePAM (KLONOPIN) 1 MG tablet Take 1 tablet by mouth 2 (two) times daily.     Marland Kitchen escitalopram (LEXAPRO) 10 MG tablet Take 10 mg by mouth every morning.    . famotidine (PEPCID) 40 MG tablet Take 1 tablet (40 mg total) by mouth daily as needed for heartburn or indigestion. 30 tablet 11  . irbesartan-hydrochlorothiazide (AVALIDE) 300-12.5 MG tablet 1 tablet daily.    . ondansetron (ZOFRAN) 4 MG tablet TAKE 1  TABLET BY MOUTH EVERY 8 HOURS AS NEEDED FOR NAUSEA FOR VOMITING 30 tablet 0  . ondansetron (ZOFRAN) 4 MG tablet Take 1 tablet (4 mg total) by mouth every 8 (eight) hours as needed for nausea or vomiting. 30 tablet 1  . tolterodine (DETROL LA) 4 MG 24 hr capsule Take 4 mg by mouth daily.    Marland Kitchen topiramate (TOPAMAX) 100 MG tablet Take 300 mg by mouth daily after supper.     . TRAZODONE HCL PO Take 75 mg by mouth daily.    . ziprasidone (GEODON) 60 MG capsule Take 60 mg by mouth at bedtime.     No current facility-administered medications for this visit.    Allergies as of 09/17/2020 - Review Complete 07/05/2020  Allergen Reaction Noted  . Penicillins Itching and Other (See Comments) 02/22/2012  . Sulfa antibiotics Swelling 02/22/2012    Family History  Problem Relation Age of Onset  . Hypertension Mother   . Hypertension Father   . Diabetes Paternal Aunt   . Diabetes Paternal Uncle   . Colon cancer Neg Hx   . Stomach cancer Neg Hx   . Pancreatic cancer Neg Hx     Social History   Socioeconomic History  . Marital status: Single    Spouse name: Not on file  . Number of children: 2  . Years of education: 14  . Highest education level: Not on file  Occupational History  . Occupation: Disability  Tobacco Use  . Smoking status: Never Smoker  . Smokeless tobacco: Never Used  Vaping Use  . Vaping Use: Never used  Substance and Sexual Activity  . Alcohol use: Not Currently  . Drug use: No  . Sexual activity: Not Currently    Birth control/protection: I.U.D.  Other Topics Concern  . Not on file  Social History Narrative   Fun/Hobby: Attend classes at the [employer]; working on going to the gym.    Social Determinants of Health   Financial Resource Strain:   . Difficulty of Paying Living Expenses: Not on file  Food Insecurity:   . Worried About Programme researcher, broadcasting/film/video in the Last Year: Not on file  . Ran Out of Food in the Last Year: Not on file  Transportation  Needs:   . Lack of Transportation (Medical): Not on file  . Lack of Transportation (Non-Medical): Not on file  Physical Activity:   . Days of Exercise per Week: Not on file  . Minutes of Exercise per Session: Not on file  Stress:   . Feeling of Stress : Not on file  Social Connections:   . Frequency of Communication with Friends and Family: Not on file  . Frequency of Social Gatherings with Friends  and Family: Not on file  . Attends Religious Services: Not on file  . Active Member of Clubs or Organizations: Not on file  . Attends Banker Meetings: Not on file  . Marital Status: Not on file  Intimate Partner Violence:   . Fear of Current or Ex-Partner: Not on file  . Emotionally Abused: Not on file  . Physically Abused: Not on file  . Sexually Abused: Not on file    Review of Systems:    Constitutional: No weight loss, fever or chills Cardiovascular: No chest pain   Respiratory: No SOB Gastrointestinal: See HPI and otherwise negative   Physical Exam:  Vital signs: BP 108/60   Pulse 76   Ht 5\' 6"  (1.676 m)   Wt 259 lb (117.5 kg)   BMI 41.80 kg/m   Constitutional:   Pleasant Obese AA female appears to be in NAD, Well developed, Well nourished, alert and cooperative Respiratory: Respirations even and unlabored. Lungs clear to auscultation bilaterally.   No wheezes, crackles, or rhonchi.  Cardiovascular: Normal S1, S2. No MRG. Regular rate and rhythm. No peripheral edema, cyanosis or pallor.  Gastrointestinal:  Soft, nondistended, nontender. No rebound or guarding. Normal bowel sounds. No appreciable masses or hepatomegaly. Rectal:  Not performed.  Psychiatric: Demonstrates good judgement and reason without abnormal affect or behaviors.  No recent labs or imaging.  Assessment: 1.  Fecal incontinence: Thought related to bile salt issues in the past, some better with cholestyramine, no benefit from fiber in the past 2.  Nausea and vomiting: 3 times a week, thought  related to Cholestyramine but patient does not want stop this medicine  Plan: 1.  It was suspected in the past that Cholestyramine was adding to patient's nausea, this was some better when she was not taking this medicine but she does not want to stop it. 2.  Recommend the patient try to space out her doses of Cholestyramine to in the morning and in the evening with meals instead of the afternoon.  Possibly this added separation will help with nausea. 3.  Increased Zofran to 8 mg to be scheduled at lunchtime every day.  Patient can then use it every 4-6 hours as needed beyond that.  Prescribed #30 with 3 refills. 4.  Did offer the patient a follow-up appointment but she would prefer to call and let know how she is doing.  [place], PA-C [facility] 09/17/2020, 2:51 PM  Cc: 09/19/2020, MD

## 2020-10-06 NOTE — Progress Notes (Signed)
Reviewed and agree with documentation and assessment and plan. K. Veena Drayson Dorko , MD   

## 2020-10-26 ENCOUNTER — Telehealth: Payer: Self-pay | Admitting: Gastroenterology

## 2020-10-26 NOTE — Telephone Encounter (Signed)
Noted  

## 2020-10-26 NOTE — Telephone Encounter (Signed)
Pt is requesting a call back from a nurse, pt states she has been experiencing vomiting and diarrhea for the past 4 days, pt would like to know if she could be prescribed something to help her.

## 2020-10-26 NOTE — Telephone Encounter (Signed)
Pt reports she is taking the zofran 8mg  and the cholestyramine but still having problems with N,V and diarrhea. Reports the meds are not working now. Pt scheduled to see NP tomorrow at 1:30pm. Pt aware of appt.

## 2020-10-27 ENCOUNTER — Other Ambulatory Visit (INDEPENDENT_AMBULATORY_CARE_PROVIDER_SITE_OTHER): Payer: Medicare Other

## 2020-10-27 ENCOUNTER — Encounter: Payer: Self-pay | Admitting: Nurse Practitioner

## 2020-10-27 ENCOUNTER — Ambulatory Visit (INDEPENDENT_AMBULATORY_CARE_PROVIDER_SITE_OTHER): Payer: Medicare Other | Admitting: Nurse Practitioner

## 2020-10-27 VITALS — BP 146/90 | HR 68 | Ht 64.5 in | Wt 238.2 lb

## 2020-10-27 DIAGNOSIS — R112 Nausea with vomiting, unspecified: Secondary | ICD-10-CM

## 2020-10-27 DIAGNOSIS — R197 Diarrhea, unspecified: Secondary | ICD-10-CM

## 2020-10-27 LAB — C-REACTIVE PROTEIN: CRP: 2.5 mg/dL (ref 0.5–20.0)

## 2020-10-27 LAB — COMPREHENSIVE METABOLIC PANEL
ALT: 15 U/L (ref 0–35)
AST: 21 U/L (ref 0–37)
Albumin: 4.3 g/dL (ref 3.5–5.2)
Alkaline Phosphatase: 58 U/L (ref 39–117)
BUN: 13 mg/dL (ref 6–23)
CO2: 25 mEq/L (ref 19–32)
Calcium: 10 mg/dL (ref 8.4–10.5)
Chloride: 103 mEq/L (ref 96–112)
Creatinine, Ser: 1.02 mg/dL (ref 0.40–1.20)
GFR: 62.1 mL/min (ref >60.00)
Glucose, Bld: 91 mg/dL (ref 70–99)
Potassium: 3.4 mEq/L — ABNORMAL LOW (ref 3.5–5.1)
Sodium: 139 mEq/L (ref 135–145)
Total Bilirubin: 0.4 mg/dL (ref 0.2–1.2)
Total Protein: 8 g/dL (ref 6.0–8.3)

## 2020-10-27 LAB — LIPASE: Lipase: 42 U/L (ref 11.0–59.0)

## 2020-10-27 LAB — CBC WITH DIFFERENTIAL/PLATELET
Basophils Absolute: 0.1 10*3/uL (ref 0.0–0.1)
Basophils Relative: 0.7 % (ref 0.0–3.0)
Eosinophils Absolute: 0.1 10*3/uL (ref 0.0–0.7)
Eosinophils Relative: 1.1 % (ref 0.0–5.0)
HCT: 42 % (ref 36.0–46.0)
Hemoglobin: 13.1 g/dL (ref 12.0–15.0)
Lymphocytes Relative: 27.8 % (ref 12.0–46.0)
Lymphs Abs: 2.2 10*3/uL (ref 0.7–4.0)
MCHC: 31.2 g/dL (ref 30.0–36.0)
MCV: 75.9 fl — ABNORMAL LOW (ref 78.0–100.0)
Monocytes Absolute: 0.7 10*3/uL (ref 0.1–1.0)
Monocytes Relative: 9.5 % (ref 3.0–12.0)
Neutro Abs: 4.8 10*3/uL (ref 1.4–7.7)
Neutrophils Relative %: 60.9 % (ref 43.0–77.0)
Platelets: 274 10*3/uL (ref 150.0–400.0)
RBC: 5.54 Mil/uL — ABNORMAL HIGH (ref 3.87–5.11)
RDW: 15.3 % (ref 11.5–15.5)
WBC: 7.8 10*3/uL (ref 4.0–10.5)

## 2020-10-27 MED ORDER — ONDANSETRON 4 MG PO TBDP: 4.0000 mg | ORAL_TABLET | Freq: Four times a day (QID) | ORAL | 0 refills | Status: DC | PRN

## 2020-10-27 MED ORDER — COLESTIPOL HCL 1 G PO TABS: 1.0000 g | ORAL_TABLET | Freq: Two times a day (BID) | ORAL | 1 refills | Status: DC

## 2020-10-27 NOTE — Patient Instructions (Signed)
If you are age 55 or older, your body mass index should be between 23-30. Your Body mass index is 40.26 kg/m. If this is out of the aforementioned range listed, please consider follow up with your Primary Care Provider.  If you are age 55 or younger, your body mass index should be between 19-25. Your Body mass index is 40.26 kg/m. If this is out of the aformentioned range listed, please consider follow up with your Primary Care Provider.    Your provider has requested that you go to the basement level for lab work before leaving today. Press "B" on the elevator. The lab is located at the first door on the left as you exit the elevator.  Due to recent changes in healthcare laws, you may see the results of your imaging and laboratory studies on MyChart before your provider has had a chance to review them.  We understand that in some cases there may be results that are confusing or concerning to you. Not all laboratory results come back in the same time frame and the provider may be waiting for multiple results in order to interpret others.  Please give [place] 48 hours in order for your provider to thoroughly review all the results before contacting the office for clarification of your results.    Please STOP Cholestyramine.  We have sent the following medications to your pharmacy for you to pick up at your convenience:   Ondansetron 4 MG dissolvable tablet. Place one tablet under tongue every 6 hours as needed for nausea and vomiting.  Colestid 1 gram tablet, take one tablet two times a day.  Push your fluid intake and follow a bland diet. We will determine what abdominal image studies to do after all your lab results come in.  It was great seeing you today!  Thank you for entrusting me with your care and choosing [facility].  Alcide Evener, NP

## 2020-10-27 NOTE — Progress Notes (Signed)
10/27/2020 Shannon DIZDAREVIC [id number] 1966-06-03   Chief Complaint: Nausea and vomiting   History of Present Illness: Shannon Sharp is a 54 year old female with a past medical history of anxiety, depression, bipolar disorder, HTN,seizures, gallstones, sleep apnea and GERD. She is followed by Dr. Lavon Paganini. She was last seen in our office by Shannon Meeker PA-C on 09/17/2020 for further evaluation for N/V and fecal incontinence. She was previously prescribed Cholestyramine for loose stools and fecal leakage which resulted in nausea so she was switched to Colestid. She had worsening loose stools on Colestid so she was switched back to Cholestyramine. Today, she requests to go back on Colestid as she gags and has more nausea with Cholestyramine. She has nausea even on days she does not take Cholestyramine. She vomits partially digested food once or twice weekly for the past 6 months.  No coffee ground emesis or hematemesis. No specific food triggers. She takes Zofran po with intermittent relief. No upper or lower abdominal pain. She endorses losing 10lbs over the past 2 to 3 weeks. Thursday 10/22/2020 she awakened at 3am and felt weak and dizzy. Friday 12/24 she had excessive vomiting and diarrhea. She felt "deathly ill" and called 911. Her assessment by the EMS team was stable and ED evaluation was not recommended. Her acute diarrhea has decreased. She passed 3 brown mud like stools today. She was passing fairly normal soft formed stools prior 12/24 with less fecal leakage.  No recent antibiotics. She underwent an EGD in 2019 which was normal and  a colonoscopy 11/08/2017  identified nonbleeding internal hemorrhoids and otherwise normal.  Repeat recommended in 10 years.  RUQ sono 03/27/2017: 1. Cholelithiasis without sonographic features for acute cholecystitis. 2. No biliary dilatation.  EGD 11/08/2017   normal   Colonoscopy 1. Cholelithiasis without sonographic features for  acute cholecystitis. 2. No biliary dilatation.  RUQ sono 03/27/2017: 1. Cholelithiasis without sonographic features for acute cholecystitis. 2. No biliary dilatation.  Current Outpatient Medications on File Prior to Visit  Medication Sig Dispense Refill   amLODipine (NORVASC) 5 MG tablet 1 tablet daily.     escitalopram (LEXAPRO) 10 MG tablet Take 10 mg by mouth every morning.     famotidine (PEPCID) 40 MG tablet Take 1 tablet (40 mg total) by mouth daily as needed for heartburn or indigestion. 30 tablet 11   irbesartan-hydrochlorothiazide (AVALIDE) 300-12.5 MG tablet 1 tablet daily.     ondansetron (ZOFRAN) 8 MG tablet Take 1 tablet (8 mg total) by mouth every 8 (eight) hours as needed for nausea or vomiting. 30 tablet 5   tolterodine (DETROL LA) 4 MG 24 hr capsule Take 4 mg by mouth daily.     topiramate (TOPAMAX) 100 MG tablet Take 300 mg by mouth daily after supper.      TRAZODONE HCL PO Take 75 mg by mouth daily.     ziprasidone (GEODON) 60 MG capsule Take 60 mg by mouth at bedtime.     cholestyramine (QUESTRAN) 4 GM/DOSE powder Take 1 packet (4 g total) by mouth 2 (two) times daily with a meal. (Patient not taking: Reported on 10/27/2020) 60 packet 2   No current facility-administered medications on file prior to visit.   Allergies  Allergen Reactions   Penicillins Itching and Other (See Comments)    Has patient had a PCN reaction causing immediate rash, facial/tongue/throat swelling, SOB or lightheadedness with hypotension: no Has patient had a PCN reaction causing severe rash involving mucus membranes or  skin necrosis: no Has patient had a PCN reaction that required hospitalization: no Has patient had a PCN reaction occurring within the last 10 years: no If all of the above answers are "NO", then may proceed with Cephalosporin use.    Sulfa Antibiotics Swelling    Current Medications, Allergies, Past Medical History, Past Surgical History, Family History and  Social History were reviewed in [employer] record.   Review of Systems:   Constitutional: Negative for fever, sweats, chills or weight loss.  Respiratory: Negative for shortness of breath.   Cardiovascular: Negative for chest pain, palpitations and leg swelling.  Gastrointestinal: See HPI.  Musculoskeletal: Negative for back pain or muscle aches.  Neurological: Negative for dizziness, headaches or paresthesias.    Physical Exam: BP (!) 146/90 (BP Location: Left Arm, Patient Position: Sitting, Cuff Size: Normal)    Pulse 68    Ht 5' 4.5" (1.638 m) Comment: height measured without shoes   Wt 238 lb 4 oz (108.1 kg)    BMI 40.26 kg/m  General: Obese 54 year old female in no acute distress. Head: Normocephalic and atraumatic. Eyes: No scleral icterus. Conjunctiva pink . Ears: Normal auditory acuity. Mouth: Dentition intact. No ulcers or lesions.  Lungs: Clear throughout to auscultation. Heart: Regular rate and rhythm, soft murmur. Abdomen: Soft. Mild epigastric tenderness without rebound or guarding. Nondistended. No masses or hepatomegaly. Normal bowel sounds x 4 quadrants.  Rectal: Deferred.  Musculoskeletal: Symmetrical with no gross deformities. Extremities: No edema. Neurological: Alert oriented x 4. No focal deficits.  Psychological: Alert and cooperative. Normal mood and affect  Assessment and Recommendations:  14. 54 year old female with N/V x 6 months  -CBC, CMP, CRP and lipase level -Abdominal imaging CT vs sono to be determined after the above lab results received  -Consider eventual gastric empty study and/or repeat EGD -Ondansetron ODT 4mg  one tab Q 6 to 8 hrs PRN -Avoid fatty foods, bland diet, push fluids -Patient to call our office if symptoms worsen   2. Acute onset vomiting and diarrhea 12/24 most likely self limited viral gastroenteritis   3. IBS-D with fecal leakage -Stop Cholestyramine, restart Colestid 1gm po bid per patient's request

## 2020-10-28 ENCOUNTER — Telehealth: Payer: Self-pay | Admitting: Nurse Practitioner

## 2020-10-28 DIAGNOSIS — R112 Nausea with vomiting, unspecified: Secondary | ICD-10-CM | POA: Insufficient documentation

## 2020-10-28 NOTE — Telephone Encounter (Signed)
Patient called about lab results, please call patient. ° °

## 2020-10-28 NOTE — Telephone Encounter (Signed)
The pt has been advised that labs have not been reviewed and as soon as possible we will contact her.  She states that it is ok to leave a voicemail.

## 2020-11-12 ENCOUNTER — Telehealth: Payer: Self-pay | Admitting: Nurse Practitioner

## 2020-11-12 ENCOUNTER — Other Ambulatory Visit: Payer: Self-pay | Admitting: Nurse Practitioner

## 2020-11-12 DIAGNOSIS — R112 Nausea with vomiting, unspecified: Secondary | ICD-10-CM

## 2020-11-12 NOTE — Telephone Encounter (Signed)
Spoke to patient who c/o nausea and vomiting. She is scheduled for an [place] on 11/17/20 she was advised to continue Ondansetron ODT 4mg  one tab Q 6 to 8 hrs PRN Avoid fatty foods, start bland diet, push fluids. Her symptoms are intermittent. She will go to the ED if her symptoms worsen. All questions answered patient voiced understanding.

## 2020-11-17 ENCOUNTER — Ambulatory Visit (HOSPITAL_BASED_OUTPATIENT_CLINIC_OR_DEPARTMENT_OTHER): Payer: Medicare Other

## 2020-11-24 ENCOUNTER — Ambulatory Visit (HOSPITAL_BASED_OUTPATIENT_CLINIC_OR_DEPARTMENT_OTHER): Payer: Medicare Other

## 2020-12-09 ENCOUNTER — Telehealth: Payer: Self-pay | Admitting: Nurse Practitioner

## 2020-12-09 NOTE — Telephone Encounter (Signed)
Pt called to let you know that she would like to be added to a waiting list to have [place] in [place].

## 2020-12-09 NOTE — Telephone Encounter (Signed)
Message left with patient who has cancelled her [place] appointments twice, that she can call for an appointment herself . The number to call was provided to her.

## 2020-12-14 ENCOUNTER — Other Ambulatory Visit (HOSPITAL_BASED_OUTPATIENT_CLINIC_OR_DEPARTMENT_OTHER): Payer: Self-pay | Admitting: Internal Medicine

## 2020-12-14 ENCOUNTER — Ambulatory Visit (HOSPITAL_BASED_OUTPATIENT_CLINIC_OR_DEPARTMENT_OTHER): Payer: Medicare Other | Attending: Nurse Practitioner

## 2020-12-22 ENCOUNTER — Ambulatory Visit (INDEPENDENT_AMBULATORY_CARE_PROVIDER_SITE_OTHER): Payer: Medicare Other | Admitting: Gastroenterology

## 2020-12-22 ENCOUNTER — Other Ambulatory Visit (INDEPENDENT_AMBULATORY_CARE_PROVIDER_SITE_OTHER): Payer: Medicare Other

## 2020-12-22 ENCOUNTER — Encounter: Payer: Self-pay | Admitting: Gastroenterology

## 2020-12-22 ENCOUNTER — Other Ambulatory Visit: Payer: Self-pay

## 2020-12-22 VITALS — BP 136/80 | HR 72 | Ht 64.5 in | Wt 241.5 lb

## 2020-12-22 DIAGNOSIS — R1013 Epigastric pain: Secondary | ICD-10-CM

## 2020-12-22 DIAGNOSIS — R112 Nausea with vomiting, unspecified: Secondary | ICD-10-CM

## 2020-12-22 DIAGNOSIS — R159 Full incontinence of feces: Secondary | ICD-10-CM

## 2020-12-22 DIAGNOSIS — R1011 Right upper quadrant pain: Secondary | ICD-10-CM | POA: Diagnosis not present

## 2020-12-22 DIAGNOSIS — K8020 Calculus of gallbladder without cholecystitis without obstruction: Secondary | ICD-10-CM | POA: Diagnosis not present

## 2020-12-22 LAB — LIPASE: Lipase: 10 U/L — ABNORMAL LOW (ref 11.0–59.0)

## 2020-12-22 LAB — COMPREHENSIVE METABOLIC PANEL
ALT: 10 U/L (ref 0–35)
AST: 13 U/L (ref 0–37)
Albumin: 4.1 g/dL (ref 3.5–5.2)
Alkaline Phosphatase: 70 U/L (ref 39–117)
BUN: 14 mg/dL (ref 6–23)
CO2: 25 mEq/L (ref 19–32)
Calcium: 9.7 mg/dL (ref 8.4–10.5)
Chloride: 105 mEq/L (ref 96–112)
Creatinine, Ser: 1.18 mg/dL (ref 0.40–1.20)
GFR: 52.08 mL/min — ABNORMAL LOW (ref >60.00)
Glucose, Bld: 90 mg/dL (ref 70–99)
Potassium: 3.8 mEq/L (ref 3.5–5.1)
Sodium: 138 mEq/L (ref 135–145)
Total Bilirubin: 0.5 mg/dL (ref 0.2–1.2)
Total Protein: 7.6 g/dL (ref 6.0–8.3)

## 2020-12-22 MED ORDER — ONDANSETRON 4 MG PO TBDP: 4.0000 mg | ORAL_TABLET | Freq: Every day | ORAL | 1 refills | Status: DC

## 2020-12-22 MED ORDER — PROMETHAZINE HCL 12.5 MG PO TABS: 12.5000 mg | ORAL_TABLET | Freq: Two times a day (BID) | ORAL | 0 refills | Status: DC | PRN

## 2020-12-22 NOTE — Progress Notes (Signed)
Shannon Sharp    [id number]    03/11/1966  Primary Care Physician:Husain, Jerelyn Scott, MD  Referring Physician: Georgann Housekeeper, MD [address] [place],  [place] [address]   Chief complaint:  Nausea, abdominal pain  HPI:  55 year old female for follow-up visit for nausea and abdominal pain  She had a severe episode around Christmas, started with nausea, vomiting and then diarrhea Lasted for 3 days  Nausea worse in the past 1 to 2 weeks.  No new medications other than cholestyramine.  Denies any heartburn, dysphagia, odynophagia, abdominal pain, melena or blood per rectum She is having 1 bowel movement on average every other day, no longer has diarrhea or fecal incontinence.  She was seen by Shanda Bumps on February 12, 2020 for fecal incontinence.  She has 1 loose bowel movement per day on average, has fecal leakage and seepage.  She was started on cholestyramine by Shanda Bumps at last visit.  She had used Benefiber in the past with no improvement  Esophageal manometry March 2019: Normal relaxation of the EG junction with ineffective esophageal motility  24-hour pH study: Showed no evidence of significant gastroesophageal acid reflux  Colonoscopy January 2019: Showed internal hemorrhoids otherwise unremarkable exam.  EGD January 2019: Normal  Abdominal ultrasound Mar 28, 2017: 1.3 cm gallstone, gallbladder wall within normal limits at 2.4 mm no free pericholecystic fluid.   Outpatient Encounter Medications as of 12/22/2020  Medication Sig   amLODipine (NORVASC) 5 MG tablet 1 tablet daily.   colestipol (COLESTID) 1 g tablet Take 1 tablet (1 g total) by mouth 2 (two) times daily.   escitalopram (LEXAPRO) 10 MG tablet Take 10 mg by mouth every morning.   famotidine (PEPCID) 40 MG tablet Take 1 tablet (40 mg total) by mouth daily as needed for heartburn or indigestion.   irbesartan-hydrochlorothiazide (AVALIDE) 300-12.5 MG tablet 1 tablet daily.    ondansetron (ZOFRAN ODT) 4 MG disintegrating tablet Take 1 tablet (4 mg total) by mouth every 6 (six) hours as needed for nausea or vomiting.   ondansetron (ZOFRAN) 8 MG tablet Take 1 tablet (8 mg total) by mouth every 8 (eight) hours as needed for nausea or vomiting.   tolterodine (DETROL LA) 4 MG 24 hr capsule Take 4 mg by mouth daily.   topiramate (TOPAMAX) 100 MG tablet Take 300 mg by mouth daily after supper.    TRAZODONE HCL PO Take 75 mg by mouth daily.   ziprasidone (GEODON) 60 MG capsule Take 60 mg by mouth at bedtime.   [DISCONTINUED] cholestyramine (QUESTRAN) 4 GM/DOSE powder Take 1 packet (4 g total) by mouth 2 (two) times daily with a meal. (Patient not taking: Reported on 10/27/2020)   No facility-administered encounter medications on file as of 12/22/2020.    Allergies as of 12/22/2020 - Review Complete 12/22/2020  Allergen Reaction Noted   Penicillins Itching and Other (See Comments) 02/22/2012   Sulfa antibiotics Swelling 02/22/2012    Past Medical History:  Diagnosis Date   Allergy    Anxiety    Bipolar 2 disorder (HCC)    Cholelithiasis 02/2017   DDD (degenerative disc disease), cervical    Depression    Diarrhea    Fecal incontinence    GERD (gastroesophageal reflux disease)    HTN (hypertension)    Internal hemorrhoids 10/2017   noted on colonoscopy   Leg swelling    Morbid obesity (HCC)    OA (osteoarthritis)    OSA  on CPAP    Peristalsis 12/2017   weak and frequent failed noted on esophageal manometry   Seizure (HCC) 02/2018   Urinary incontinence    UTI (urinary tract infection) 04/10/2018   Hospitalized    Past Surgical History:  Procedure Laterality Date   49 HOUR PH STUDY N/A 01/08/2018   Procedure: 24 HOUR PH STUDY;  Surgeon: Napoleon Form, MD;  Location: [facility];  Service: Endoscopy;  Laterality: N/A;   COLONOSCOPY  11/08/2017   ESOPHAGEAL MANOMETRY N/A 01/08/2018   Procedure: ESOPHAGEAL MANOMETRY  (EM);  Surgeon: Napoleon Form, MD;  Location: [facility];  Service: Endoscopy;  Laterality: N/A;   ESOPHAGOGASTRODUODENOSCOPY ENDOSCOPY  11/08/2017    Family History  Problem Relation Age of Onset   Hypertension Mother    Hypertension Father    Diabetes Paternal Aunt    Diabetes Paternal Uncle    Colon cancer Neg Hx    Stomach cancer Neg Hx    Pancreatic cancer Neg Hx     Social History   Socioeconomic History   Marital status: Single    Spouse name: Not on file   Number of children: 2   Years of education: 12   Highest education level: Not on file  Occupational History   Occupation: Disability  Tobacco Use   Smoking status: Never Smoker   Smokeless tobacco: Never Used  Building services engineer Use: Never used  Substance and Sexual Activity   Alcohol use: Not Currently   Drug use: No   Sexual activity: Not Currently    Birth control/protection: I.U.D.  Other Topics Concern   Not on file  Social History Narrative   Fun/Hobby: Attend classes at the [employer]; working on going to the gym.    Social Determinants of Health   Financial Resource Strain: Not on file  Food Insecurity: Not on file  Transportation Needs: Not on file  Physical Activity: Not on file  Stress: Not on file  Social Connections: Not on file  Intimate Partner Violence: Not on file      Review of systems: All other review of systems negative except as mentioned in the HPI.   Physical Exam: Vitals:   12/22/20 1454  BP: 136/80  Pulse: 72   Body mass index is 40.81 kg/m. Gen:      No acute distress HEENT:  sclera anicteric Abd:      soft, non-tender; no palpable masses, no distension Ext:    No edema Neuro: alert and oriented x 3 Psych: normal mood and affect  Data Reviewed:  Reviewed labs, radiology imaging, old records and pertinent past GI work up   Assessment and Plan/Recommendations:  55 year old very pleasant female with complaints  of nausea, right upper quadrant abdominal pain  She has history of large gallstone Will repeat right upper quadrant abdominal ultrasound to exclude cholecystitis, if has increased size of gallstone or any features of cholecystitis.  Would refer to [facility] surgery for possible cholecystectomy Use Zofran or Phenergan as needed  IBS-diarrhea and fecal incontinence: Use Fiberchoice 1 tablet daily Continue Colestid 1 g twice daily for possible bile salt induced diarrhea  Return in 3 months or sooner if needed  This visit required 30 minutes of patient care (this includes precharting, chart review, review of results, face-to-face time used for counseling as well as treatment plan and follow-up. The patient was provided an opportunity to ask questions and all were answered. The patient agreed with the plan and demonstrated  an understanding of the instructions.  Iona Beard , MD    CC: Georgann Housekeeper, MD

## 2020-12-22 NOTE — Patient Instructions (Signed)
You will be contacted by [facility] Scheduling in the next 2 days to arrange a RUQ pain.  The number on your caller ID will be [phone], please answer when they call.  If you have not heard from them in 2 days please call [phone] to schedule.    Your provider has requested that you go to the basement level for lab work before leaving today. Press "B" on the elevator. The lab is located at the first door on the left as you exit the elevator.  Take Fiberchoice 1 tablet daily  Wewill refill your Zofran and send in Phenergan to your pharmacy  We will refer you to [facility] and they will contact you with that appointment  Due to recent changes in healthcare laws, you may see the results of your imaging and laboratory studies on MyChart before your provider has had a chance to review them.  We understand that in some cases there may be results that are confusing or concerning to you. Not all laboratory results come back in the same time frame and the provider may be waiting for multiple results in order to interpret others.  Please give [place] 48 hours in order for your provider to thoroughly review all the results before contacting the office for clarification of your results.   Follow up in 3 months  I appreciate the  opportunity to care for you  Thank You   Marsa Aris , MD

## 2020-12-28 NOTE — Progress Notes (Signed)
Reviewed and agree with documentation and assessment and plan. K. Veena Famous Eisenhardt , MD   

## 2021-01-04 ENCOUNTER — Telehealth: Payer: Self-pay | Admitting: Nurse Practitioner

## 2021-01-04 ENCOUNTER — Other Ambulatory Visit: Payer: Self-pay

## 2021-01-04 ENCOUNTER — Ambulatory Visit (HOSPITAL_COMMUNITY)
Admission: RE | Admit: 2021-01-04 | Discharge: 2021-01-04 | Disposition: A | Discharge status: Home / Self Care | Payer: Medicare Other | Source: Ambulatory Visit | Attending: Gastroenterology | Admitting: Gastroenterology

## 2021-01-04 DIAGNOSIS — R1011 Right upper quadrant pain: Secondary | ICD-10-CM | POA: Insufficient documentation

## 2021-01-04 DIAGNOSIS — R112 Nausea with vomiting, unspecified: Secondary | ICD-10-CM | POA: Insufficient documentation

## 2021-01-04 DIAGNOSIS — K8020 Calculus of gallbladder without cholecystitis without obstruction: Secondary | ICD-10-CM | POA: Insufficient documentation

## 2021-01-04 DIAGNOSIS — R159 Full incontinence of feces: Secondary | ICD-10-CM | POA: Diagnosis present

## 2021-01-04 DIAGNOSIS — R1013 Epigastric pain: Secondary | ICD-10-CM | POA: Insufficient documentation

## 2021-01-04 NOTE — Telephone Encounter (Signed)
Pt is requesting a call back from a nurse regarding her US results. 

## 2021-01-05 ENCOUNTER — Encounter: Payer: Self-pay | Admitting: Gastroenterology

## 2021-01-05 NOTE — Telephone Encounter (Signed)
Patient notified that Dr Lavon Paganini has not reviewed her [place] results from 01/04/21. She was informed that she will be notified once reviewed.

## 2021-01-08 ENCOUNTER — Telehealth: Payer: Self-pay | Admitting: Gastroenterology

## 2021-01-08 ENCOUNTER — Other Ambulatory Visit: Payer: Self-pay | Admitting: *Deleted

## 2021-01-08 DIAGNOSIS — R110 Nausea: Secondary | ICD-10-CM

## 2021-01-08 DIAGNOSIS — R1011 Right upper quadrant pain: Secondary | ICD-10-CM

## 2021-01-08 DIAGNOSIS — R1013 Epigastric pain: Secondary | ICD-10-CM

## 2021-01-08 DIAGNOSIS — K8020 Calculus of gallbladder without cholecystitis without obstruction: Secondary | ICD-10-CM

## 2021-01-08 NOTE — Telephone Encounter (Signed)
Our phone lines are down, we cant call out. I used my cell and called [facility] to schedule the patients referral and had to leave a message for [facility] the referral coordinator also unable to fax out at this time

## 2021-01-08 NOTE — Telephone Encounter (Signed)
Lm on vm for patient to return call 

## 2021-01-08 NOTE — Telephone Encounter (Addendum)
[facility] appointment is with Dr Antonieta Pert on April 7th at 9:20am to arrive at 8:40am in case she calls back

## 2021-01-11 NOTE — Telephone Encounter (Signed)
Left message for patient to call back  

## 2021-01-12 NOTE — Telephone Encounter (Signed)
Patient is aware of the appointment date and time and she has been in touch with [facility]

## 2021-01-14 DIAGNOSIS — G894 Chronic pain syndrome: Secondary | ICD-10-CM | POA: Diagnosis not present

## 2021-01-14 DIAGNOSIS — Z5181 Encounter for therapeutic drug level monitoring: Secondary | ICD-10-CM | POA: Diagnosis not present

## 2021-01-14 DIAGNOSIS — Z79899 Other long term (current) drug therapy: Secondary | ICD-10-CM | POA: Diagnosis not present

## 2021-01-26 ENCOUNTER — Other Ambulatory Visit: Payer: Self-pay | Admitting: Gastroenterology

## 2021-04-13 DIAGNOSIS — G894 Chronic pain syndrome: Secondary | ICD-10-CM | POA: Insufficient documentation

## 2021-04-13 DIAGNOSIS — J0100 Acute maxillary sinusitis, unspecified: Secondary | ICD-10-CM | POA: Diagnosis not present

## 2021-04-13 DIAGNOSIS — M25561 Pain in right knee: Secondary | ICD-10-CM | POA: Diagnosis not present

## 2021-05-11 DIAGNOSIS — G894 Chronic pain syndrome: Secondary | ICD-10-CM | POA: Diagnosis not present

## 2021-05-21 ENCOUNTER — Encounter: Payer: Self-pay | Admitting: Physical Medicine & Rehabilitation

## 2021-05-25 DIAGNOSIS — Z1389 Encounter for screening for other disorder: Secondary | ICD-10-CM | POA: Diagnosis not present

## 2021-05-25 DIAGNOSIS — N183 Chronic kidney disease, stage 3 unspecified: Secondary | ICD-10-CM | POA: Diagnosis not present

## 2021-05-25 DIAGNOSIS — Z0000 Encounter for general adult medical examination without abnormal findings: Secondary | ICD-10-CM | POA: Diagnosis not present

## 2021-05-25 DIAGNOSIS — G4733 Obstructive sleep apnea (adult) (pediatric): Secondary | ICD-10-CM | POA: Diagnosis not present

## 2021-05-25 DIAGNOSIS — I10 Essential (primary) hypertension: Secondary | ICD-10-CM | POA: Diagnosis not present

## 2021-05-25 DIAGNOSIS — R7303 Prediabetes: Secondary | ICD-10-CM | POA: Diagnosis not present

## 2021-05-25 DIAGNOSIS — J069 Acute upper respiratory infection, unspecified: Secondary | ICD-10-CM | POA: Diagnosis not present

## 2021-06-23 DIAGNOSIS — M170 Bilateral primary osteoarthritis of knee: Secondary | ICD-10-CM | POA: Diagnosis not present

## 2021-07-27 ENCOUNTER — Encounter: Payer: Medicare Other | Admitting: Physical Medicine & Rehabilitation

## 2021-07-29 DIAGNOSIS — M13861 Other specified arthritis, right knee: Secondary | ICD-10-CM | POA: Diagnosis not present

## 2021-07-29 DIAGNOSIS — M13862 Other specified arthritis, left knee: Secondary | ICD-10-CM | POA: Diagnosis not present

## 2021-07-29 DIAGNOSIS — M170 Bilateral primary osteoarthritis of knee: Secondary | ICD-10-CM | POA: Diagnosis not present

## 2021-08-17 DIAGNOSIS — G894 Chronic pain syndrome: Secondary | ICD-10-CM | POA: Diagnosis not present

## 2021-08-23 ENCOUNTER — Telehealth: Payer: Self-pay | Admitting: Gastroenterology

## 2021-08-23 NOTE — Telephone Encounter (Signed)
Patient called states she is having diarrhea seeking advise. ?

## 2021-08-24 NOTE — Telephone Encounter (Signed)
Spoke with the patient. Complains of diarrhea. States she had been doing well and that this sometimes happens for "no apparent reason." She confirms she has not taken recent antibiotics, no NSAIDs or prednisone. No known sick contacts. She took bismuth last night and reports this did turn her stool black. No black stools or bloody stools before the bismuth. She has taken Imodium today and not diarrhea stools "so far." Afebrile. She will call back if she acutely worsens or she fails to improve.

## 2021-08-24 NOTE — Telephone Encounter (Signed)
Okay, will need to check GI pathogen panel if she continues to have persistent diarrhea.  Possible lactose intolerance or food intolerance is contributing to intermittent symptoms of diarrhea.  If she continues to have these episodes, we can consider Xifaxan 550 mg 3 times daily for 14-day course for IBS diarrhea.  Thank you

## 2021-10-05 ENCOUNTER — Encounter (HOSPITAL_COMMUNITY): Payer: Medicare Other

## 2021-10-18 ENCOUNTER — Ambulatory Visit: Admit: 2021-10-18 | Payer: Medicare Other | Admitting: Orthopedic Surgery

## 2021-10-18 SURGERY — ARTHROPLASTY, KNEE, TOTAL
Anesthesia: Choice | Site: Knee | Laterality: Left

## 2021-11-23 DIAGNOSIS — Z79891 Long term (current) use of opiate analgesic: Secondary | ICD-10-CM | POA: Diagnosis not present

## 2021-11-23 DIAGNOSIS — G894 Chronic pain syndrome: Secondary | ICD-10-CM | POA: Diagnosis not present

## 2021-12-10 ENCOUNTER — Telehealth: Payer: Self-pay

## 2021-12-10 NOTE — Telephone Encounter (Signed)
Called pt needing an OV for knee pains, LVM #1

## 2021-12-14 ENCOUNTER — Other Ambulatory Visit: Payer: Self-pay

## 2021-12-14 ENCOUNTER — Ambulatory Visit (INDEPENDENT_AMBULATORY_CARE_PROVIDER_SITE_OTHER): Payer: Medicare Other | Admitting: Physical Medicine and Rehabilitation

## 2021-12-14 ENCOUNTER — Encounter: Payer: Self-pay | Admitting: Physical Medicine and Rehabilitation

## 2021-12-14 ENCOUNTER — Telehealth: Payer: Self-pay | Admitting: Physical Medicine and Rehabilitation

## 2021-12-14 VITALS — BP 178/97 | HR 87

## 2021-12-14 DIAGNOSIS — G8929 Other chronic pain: Secondary | ICD-10-CM

## 2021-12-14 DIAGNOSIS — M25562 Pain in left knee: Secondary | ICD-10-CM

## 2021-12-14 DIAGNOSIS — M25561 Pain in right knee: Secondary | ICD-10-CM

## 2021-12-14 DIAGNOSIS — M170 Bilateral primary osteoarthritis of knee: Secondary | ICD-10-CM | POA: Diagnosis not present

## 2021-12-14 NOTE — Progress Notes (Signed)
Pt lower back pain that travels to both knee. Pt state walking, standing and laying down makes the pain worse. Pt state she takes pain meds to help ease her pain.   Numeric Pain Rating Scale and Functional Assessment Average Pain 10 Pain Right Now 6 My pain is intermittent, sharp, and aching Pain is worse with: walking, bending, sitting, standing, and some activites Pain improves with: medication   In the last MONTH (on 0-10 scale) has pain interfered with the following?  1. General activity like being  able to carry out your everyday physical activities such as walking, climbing stairs, carrying groceries, or moving a chair?  Rating(7)  2. Relation with others like being able to carry out your usual social activities and roles such as  activities at home, at work and in your community. Rating(8)  3. Enjoyment of life such that you have  been bothered by emotional problems such as feeling anxious, depressed or irritable?  Rating(9)

## 2021-12-14 NOTE — Telephone Encounter (Signed)
I received a referral from an outside message on epic from Dr. Sheran Luz at [facility] for genicular nerve blocks of the knee.  Please have the patient come in as an office visit evaluation.  Please have her obtain her records from [facility] so that we can have those to review.

## 2021-12-14 NOTE — Progress Notes (Signed)
Shannon Sharp - 56 y.o. female MRN [id number]  Date of birth: 1966-10-25  Office Visit Note: Visit Date: 12/14/2021 PCP: Wenda Low, MD Referred by: Wenda Low, MD  Subjective: Chief Complaint  Patient presents with   Lower Back - Pain   Right Knee - Pain   Left Knee - Pain   HPI: Shannon Sharp is a 55 y.o. female who comes in today at the request of Dr. Suella Broad for evaluation of bilateral knee pain. Patient reports chronic bilateral knee issues for three years, left greater than right. Patient reports pain is exacerbated by movement, activity and prolonged standing, describes pain as constant soreness, currently rates as 8 out of 10. Patient also reports intermittent swelling to both knees. She reports some pain relief with pain medication currently prescribed by Rich Brave, PA at [facility]. Patient states she did attend formal physical therapy several months ago and reports no relief of pain with these treatments. Patients bilateral knee x-rays from 2019 exhibit tricompartmental degenerative changes. Patient states she was scheduled to have left total knee arthroplasty with Dr. Gaynelle Arabian in December of 2022 with plans to later complete same procedure on the right, however patient was not able to get scheduled time off work for recovery. Patient states she has difficulty performing daily tasks and job duties due to severe pain. Patient denies focal weakness, numbness and tingling. Patient denies recent trauma or falls.  Her pain continues despite opioid pain management with history of long-acting and short acting oxymorphone which was changed to oxycodone and ultimately now she is on Suboxone.  Review of Systems  Musculoskeletal:  Positive for joint pain.  Neurological:  Negative for tingling, sensory change, focal weakness and weakness.  All other systems reviewed and are negative. Otherwise per HPI.  Assessment & Plan: Visit Diagnoses:    ICD-10-CM    1. Chronic pain of left knee  M25.562 Ambulatory referral to Physical Medicine Rehab   G89.29     2. Chronic pain of right knee  M25.561 Ambulatory referral to Physical Medicine Rehab   G89.29     3. Bilateral primary osteoarthritis of knee  M17.0 Ambulatory referral to Physical Medicine Rehab       Plan: Findings:  Chronic, worsening and severe bilateral knee pain, left greater than right. Patient continues to have excruciating and debilitating pain despite good conservative therapies such as formal physical therapy and medication. We feel the next step is to perform a diagnostic and hopefully therapeutic left genicular nerve block under fluoroscopic guidance. If patient does well with genicular nerve blocks we did discuss the possibility of longer sustained pain relief with radiofrequency ablation procedure. I did provide patient with educational material regarding radiofrequency ablation procedure to take home and review. If patient does not get relief with diagnostic blocks or continues to have chronic bilateral knee pain after radiofrequency ablation procedure is performed we encouraged patient to follow up with orthopedist for re-evaluation or possible surgical intervention. Patient encouraged to remain active. Patient encouraged to follow-up with Rich Brave, PA for continued chronic opioid pain management. No red flag symptoms noted upon exam today.    Meds & Orders: No orders of the defined types were placed in this encounter.   Orders Placed This Encounter  Procedures   Ambulatory referral to Physical Medicine Rehab    Follow-up: Return for Left knee genicular nerve block.   Procedures: No procedures performed      Clinical History: No specialty comments available.  She reports that she has never smoked. She has never used smokeless tobacco. No results for input(s): HGBA1C, LABURIC in the last 8760 hours.  Objective:  VS:  HT:     WT:    BMI:      BP:(!) 178/97   HR:87bpm    TEMP: ( )   RESP:  Physical Exam Vitals and nursing note reviewed.  HENT:     Head: Normocephalic and atraumatic.     Right Ear: External ear normal.     Left Ear: External ear normal.     Nose: Nose normal.     Mouth/Throat:     Mouth: Mucous membranes are moist.  Eyes:     Pupils: Pupils are equal, round, and reactive to light.  Cardiovascular:     Rate and Rhythm: Normal rate.  Pulmonary:     Effort: Pulmonary effort is normal.  Abdominal:     General: Abdomen is flat. There is no distension.  Musculoskeletal:        General: Tenderness present.     Cervical back: Normal range of motion.     Comments: Full active and passive range of motion noted with both knees. No crepitus noted. No swelling noted. Tenderness noted to medial and lateral joint lines of left knee. Walks independently, gait steady.  Skin:    General: Skin is warm and dry.     Capillary Refill: Capillary refill takes less than 2 seconds.  Neurological:     General: No focal deficit present.     Mental Status: She is alert and oriented to person, place, and time.  Psychiatric:        Mood and Affect: Mood normal.        Behavior: Behavior normal.    Ortho Exam  Imaging: No results found.  Past Medical/Family/Surgical/Social History: Medications & Allergies reviewed per EMR, new medications updated. Patient Active Problem List   Diagnosis Date Noted   Attention deficit hyperactivity disorder 12/15/2021   Chronic pain syndrome 04/13/2021   Nausea with vomiting 10/28/2020   Incontinence of feces 02/12/2020   Osteoarthritis of knee 05/29/2018   CKD (chronic kidney disease), stage III ([place]) 04/11/2018   Bacteremia 04/10/2018   Acute lower UTI 04/10/2018   Heartburn    Dysphagia    Seasonal allergic rhinitis 05/08/2017   Gastroesophageal reflux disease with esophagitis 05/08/2017   Urinary frequency 03/02/2017   Acute pain of both knees 03/02/2017   Class 3 severe obesity due to excess calories  without serious comorbidity with body mass index (BMI) of 45.0 to 49.9 in adult ([place]) 03/02/2017   OBESITY, NOS 12/28/2006   ANEMIA, IRON DEFICIENCY, UNSPEC. 12/28/2006   DEPRESSION, MAJOR, RECURRENT 12/28/2006   Bipolar 2 disorder ([place]) 12/28/2006   ANXIETY 12/28/2006   PANIC ATTACKS 12/28/2006   OBSESSIVE COMPUL. DISORDER 12/28/2006   ATTENTION DEFICIT, W/HYPERACTIVITY 12/28/2006   HEARING LOSS NOS OR DEAFNESS 12/28/2006   Atopic dermatitis 12/28/2006   INSOMNIA NOS 12/28/2006   Past Medical History:  Diagnosis Date   Allergy    Anxiety    Bipolar 2 disorder ([place])    Cholelithiasis 02/2017   DDD (degenerative disc disease), cervical    Depression    Diarrhea    Fecal incontinence    GERD (gastroesophageal reflux disease)    HTN (hypertension)    Internal hemorrhoids 10/2017   noted on colonoscopy   Leg swelling    Morbid obesity (HCC)    OA (osteoarthritis)    OSA on CPAP  Peristalsis 12/2017   weak and frequent failed noted on esophageal manometry   Seizure ([place]) 02/2018   Urinary incontinence    UTI (urinary tract infection) 04/10/2018   Hospitalized   Family History  Problem Relation Age of Onset   Hypertension Mother    Hypertension Father    Diabetes Paternal Aunt    Diabetes Paternal Uncle    Colon cancer Neg Hx    Stomach cancer Neg Hx    Pancreatic cancer Neg Hx    Past Surgical History:  Procedure Laterality Date   51 HOUR [place] STUDY N/A 01/08/2018   Procedure: 24 HOUR PH STUDY;  Surgeon: Mauri Pole, MD;  [facility]: [facility];  Service: Endoscopy;  Laterality: N/A;   COLONOSCOPY  11/08/2017   ESOPHAGEAL MANOMETRY N/A 01/08/2018   Procedure: ESOPHAGEAL MANOMETRY (EM);  Surgeon: Mauri Pole, MD;  Location: [facility];  Service: Endoscopy;  Laterality: N/A;   ESOPHAGOGASTRODUODENOSCOPY ENDOSCOPY  11/08/2017   Social History   Occupational History   Occupation: Disability  Tobacco Use   Smoking status: Never   Smokeless tobacco:  Never  Vaping Use   Vaping Use: Never used  Substance and Sexual Activity   Alcohol use: Not Currently   Drug use: No   Sexual activity: Not Currently    Birth control/protection: I.U.D.

## 2021-12-15 ENCOUNTER — Telehealth: Payer: Self-pay | Admitting: Gastroenterology

## 2021-12-15 DIAGNOSIS — F909 Attention-deficit hyperactivity disorder, unspecified type: Secondary | ICD-10-CM | POA: Insufficient documentation

## 2021-12-15 MED ORDER — ONDANSETRON 4 MG PO TBDP: 4.0000 mg | ORAL_TABLET | Freq: Every day | ORAL | 1 refills | Status: DC

## 2021-12-15 NOTE — Telephone Encounter (Signed)
Refills sent  Patient to keep scheduled appointment

## 2021-12-15 NOTE — Telephone Encounter (Signed)
Inbound call from patient requesting medication refill for Zofran sent to [facility] on [address]. Patient have appt 2/21

## 2021-12-16 ENCOUNTER — Telehealth: Payer: Self-pay | Admitting: Physical Medicine and Rehabilitation

## 2021-12-16 NOTE — Telephone Encounter (Signed)
Patient called. She says she is returning a call to [facility]

## 2021-12-21 ENCOUNTER — Telehealth: Payer: Medicare Other | Admitting: Gastroenterology

## 2021-12-23 ENCOUNTER — Ambulatory Visit (INDEPENDENT_AMBULATORY_CARE_PROVIDER_SITE_OTHER): Payer: Medicare Other | Admitting: Physical Medicine and Rehabilitation

## 2021-12-23 ENCOUNTER — Other Ambulatory Visit: Payer: Self-pay

## 2021-12-23 ENCOUNTER — Encounter: Payer: Self-pay | Admitting: Physical Medicine and Rehabilitation

## 2021-12-23 ENCOUNTER — Ambulatory Visit: Payer: Self-pay

## 2021-12-23 DIAGNOSIS — G8929 Other chronic pain: Secondary | ICD-10-CM

## 2021-12-23 DIAGNOSIS — M25562 Pain in left knee: Secondary | ICD-10-CM | POA: Diagnosis not present

## 2021-12-23 MED ORDER — BUPIVACAINE HCL 0.5 % IJ SOLN: 3.0000 mL | Freq: Once | INTRAMUSCULAR | Status: DC

## 2021-12-23 NOTE — Progress Notes (Signed)
Shannon Sharp - 56 y.o. female MRN [id number]  Date of birth: [birth year]  Office Visit Note: Visit Date: 12/23/2021 PCP: Georgann Housekeeper, MD Referred by: Georgann Housekeeper, MD  Subjective: Chief Complaint  Patient presents with   Left Knee - Pain   HPI:  Shannon Sharp is a 56 y.o. female who comes in today at the request of Ellin Goodie, FNP for planned Left  Genicular nerve block block with fluoroscopic guidance.  The patient has failed conservative care including injections, home exercise, medications, time and activity modification.  Nerve block will be performed in a double block paradigm with goal towards radiofrequency ablation of the same genicular nerves for longer term definitive care.  Please see requesting physician notes for further details and justification.    Referring provider: Dr. Sheran Luz  ROS Otherwise per HPI.  Assessment & Plan: Visit Diagnoses:    ICD-10-CM   1. Chronic pain of left knee  M25.562 XR C-ARM NO REPORT   G89.29 Nerve Block    bupivacaine (MARCAINE) 0.5 % (with pres) injection 3 mL      Plan: No additional findings.   Meds & Orders:  Meds ordered this encounter  Medications   bupivacaine (MARCAINE) 0.5 % (with pres) injection 3 mL    Orders Placed This Encounter  Procedures   Nerve Block   XR C-ARM NO REPORT    Follow-up: Return for Review Pain Diary.   Procedures: No procedures performed  Genicular Nerve Blockade with Fluoroscopic Guidance  Patient: Shannon Sharp      Date of Birth: 05/20/1966 MRN: [id number] PCP: Georgann Housekeeper, MD      Visit Date: 12/23/2021   Universal Protocol:    Date/Time: 01/09/2309:01 AM  Consent Given By: the patient  Position: SUPINE  Additional Comments: Vital signs were monitored before and after the procedure. Patient was prepped and draped in the usual sterile fashion. The correct patient, procedure, and site was verified.   Injection Procedure Details:  Procedure  Site One Meds Administered:  Meds ordered this encounter  Medications   bupivacaine (MARCAINE) 0.5 % (with pres) injection 3 mL     Laterality: Left  Location/Site:  Superior medial, superior lateral and inferior medial genicular nerve  Needle size: 3.5 inch 25-gauge spinal needle  Needle Placement: Just posterior to midline on the lateral view at the locations noted in the procedure note.   Findings:    -Comments: Excellent position of the needle tip using biplanar imaging with low contrast showing no vascular flow.  Procedure Details: The fluoroscope was positioned in a 90 degree anteroposterior (AP) fluoroscopic view and then tilted to obtain a true AP view of the knee joint.  The target areas are the mid-point of the curve formed where the femoral shaft and epicondyles meet as well as the midpoint of the curve on the medial side where the tibial shaft meets the plateau.  Using fluoroscopic guidance the target areas were infiltrated using a 27-gauge needle and 1-2 mL of 1% lidocaine without epinephrine to provide local anesthetic.  Then, for each target area a 3-1/2 inch 25-gauge spinal needle was driven down under intermittent fluoroscopic guidance to the periosteum of the target site.  Once all 3 needles were in place in the AP view, a lateral view was obtained at each needle was driven control to the midpoint of the shafts respectively.  A spot film was obtained of the lateral view and saved.  Next, an AP view was obtained once  again to confirm placement.  A spot film then was obtained and saved.  After fluoroscopic imaging was used to determine and confirm placement, 0.5-1 mL of injectate was delivered at each target location.  The patient was given a pain diary to complete and return to the office.      Additional Comments:  The patient tolerated the procedure well Dressing: Band-Aid    Post-procedure details: Patient was observed during the procedure. Post-procedure  instructions were reviewed.  Patient left the clinic in stable condition.          Clinical History: No specialty comments available.     Objective:  VS:  HT:     WT:    BMI:      BP:    HR: bpm   TEMP: ( )   RESP:  Physical Exam   Imaging: No results found.

## 2021-12-23 NOTE — Progress Notes (Signed)
Pt left knee pain. Pt state walking, standing and laying down makes the pain worse. Pt state she takes pain meds to help ease her pain.   Numeric Pain Rating Scale and Functional Assessment Average Pain 6   In the last MONTH (on 0-10 scale) has pain interfered with the following?  1. General activity like being  able to carry out your everyday physical activities such as walking, climbing stairs, carrying groceries, or moving a chair?  Rating(9)   +Driver, -BT, -Dye Allergies.

## 2021-12-23 NOTE — Patient Instructions (Signed)

## 2022-01-09 NOTE — Procedures (Signed)
Genicular Nerve Blockade with Fluoroscopic Guidance  Patient: Shannon Sharp      Date of Birth: 25-Oct-1966 MRN: [id number] PCP: Georgann Housekeeper, MD      Visit Date: 12/23/2021   Universal Protocol:    Date/Time: 01/09/2309:01 AM  Consent Given By: the patient  Position: SUPINE  Additional Comments: Vital signs were monitored before and after the procedure. Patient was prepped and draped in the usual sterile fashion. The correct patient, procedure, and site was verified.   Injection Procedure Details:  Procedure Site One Meds Administered:  Meds ordered this encounter  Medications   bupivacaine (MARCAINE) 0.5 % (with pres) injection 3 mL     Laterality: Left  Location/Site:  Superior medial, superior lateral and inferior medial genicular nerve  Needle size: 3.5 inch 25-gauge spinal needle  Needle Placement: Just posterior to midline on the lateral view at the locations noted in the procedure note.   Findings:    -Comments: Excellent position of the needle tip using biplanar imaging with low contrast showing no vascular flow.  Procedure Details: The fluoroscope was positioned in a 90 degree anteroposterior (AP) fluoroscopic view and then tilted to obtain a true AP view of the knee joint.  The target areas are the mid-point of the curve formed where the femoral shaft and epicondyles meet as well as the midpoint of the curve on the medial side where the tibial shaft meets the plateau.  Using fluoroscopic guidance the target areas were infiltrated using a 27-gauge needle and 1-2 mL of 1% lidocaine without epinephrine to provide local anesthetic.  Then, for each target area a 3-1/2 inch 25-gauge spinal needle was driven down under intermittent fluoroscopic guidance to the periosteum of the target site.  Once all 3 needles were in place in the AP view, a lateral view was obtained at each needle was driven control to the midpoint of the shafts respectively.  A spot film was  obtained of the lateral view and saved.  Next, an AP view was obtained once again to confirm placement.  A spot film then was obtained and saved.  After fluoroscopic imaging was used to determine and confirm placement, 0.5-1 mL of injectate was delivered at each target location.  The patient was given a pain diary to complete and return to the office.      Additional Comments:  The patient tolerated the procedure well Dressing: Band-Aid    Post-procedure details: Patient was observed during the procedure. Post-procedure instructions were reviewed.  Patient left the clinic in stable condition.

## 2022-01-11 DIAGNOSIS — Z760 Encounter for issue of repeat prescription: Secondary | ICD-10-CM | POA: Diagnosis not present

## 2022-01-11 DIAGNOSIS — K130 Diseases of lips: Secondary | ICD-10-CM | POA: Diagnosis not present

## 2022-01-17 DIAGNOSIS — I10 Essential (primary) hypertension: Secondary | ICD-10-CM | POA: Diagnosis not present

## 2022-01-17 DIAGNOSIS — K5900 Constipation, unspecified: Secondary | ICD-10-CM | POA: Diagnosis not present

## 2022-01-17 DIAGNOSIS — K130 Diseases of lips: Secondary | ICD-10-CM | POA: Diagnosis not present

## 2022-02-14 DIAGNOSIS — L853 Xerosis cutis: Secondary | ICD-10-CM | POA: Diagnosis not present

## 2022-02-14 DIAGNOSIS — I10 Essential (primary) hypertension: Secondary | ICD-10-CM | POA: Diagnosis not present

## 2022-02-14 DIAGNOSIS — M179 Osteoarthritis of knee, unspecified: Secondary | ICD-10-CM | POA: Diagnosis not present

## 2022-02-14 DIAGNOSIS — R569 Unspecified convulsions: Secondary | ICD-10-CM | POA: Diagnosis not present

## 2022-02-22 DIAGNOSIS — G894 Chronic pain syndrome: Secondary | ICD-10-CM | POA: Diagnosis not present

## 2022-03-24 ENCOUNTER — Telehealth: Payer: Medicare Other | Admitting: Physician Assistant

## 2022-03-24 DIAGNOSIS — K529 Noninfective gastroenteritis and colitis, unspecified: Secondary | ICD-10-CM | POA: Diagnosis not present

## 2022-03-24 MED ORDER — ONDANSETRON 4 MG PO TBDP: 4.0000 mg | ORAL_TABLET | Freq: Three times a day (TID) | ORAL | 0 refills | Status: DC | PRN

## 2022-03-24 MED ORDER — LOPERAMIDE HCL 2 MG PO CAPS: 2.0000 mg | ORAL_CAPSULE | ORAL | 0 refills | Status: DC | PRN

## 2022-03-24 NOTE — Patient Instructions (Signed)
  Shannon Sharp, thank you for joining [name], PA-C for today's virtual visit.  While this provider is not your primary care provider (PCP), if your PCP is located in our provider database this encounter information will be shared with them immediately following your visit.  Consent: (Patient) Shannon Sharp provided verbal consent for this virtual visit at the beginning of the encounter.  Current Medications:  Current Outpatient Medications:    loperamide (IMODIUM) 2 MG capsule, Take 1 capsule (2 mg total) by mouth as needed for diarrhea or loose stools., Disp: 30 capsule, Rfl: 0   ondansetron (ZOFRAN-ODT) 4 MG disintegrating tablet, Take 1 tablet (4 mg total) by mouth every 8 (eight) hours as needed for nausea or vomiting., Disp: 20 tablet, Rfl: 0   Medications ordered in this encounter:  Meds ordered this encounter  Medications   ondansetron (ZOFRAN-ODT) 4 MG disintegrating tablet    Sig: Take 1 tablet (4 mg total) by mouth every 8 (eight) hours as needed for nausea or vomiting.    Dispense:  20 tablet    Refill:  0    Order Specific Question:   Supervising Provider    Answer:   Sabra Heck, BRIAN [[id number]]   loperamide (IMODIUM) 2 MG capsule    Sig: Take 1 capsule (2 mg total) by mouth as needed for diarrhea or loose stools.    Dispense:  30 capsule    Refill:  0    Order Specific Question:   Supervising Provider    Answer:   Sabra Heck, Walker     *If you need refills on other medications prior to your next appointment, please contact your pharmacy*  Follow-Up: Call back or seek an in-person evaluation if the symptoms worsen or if the condition fails to improve as anticipated.  Other Instructions   If you have been instructed to have an in-person evaluation today at a local Urgent Care facility, please use the link below. It will take you to a list of all of our available [facility], including address, phone number and hours of  operation. Please do not delay care.  [facility]  If you or a family member do not have a primary care provider, use the link below to schedule a visit and establish care. When you choose a [facility] primary care physician or advanced practice provider, you gain a long-term partner in health. Find a Primary Care Provider  Learn more about [facility]'s in-office and virtual care options: [facility]

## 2022-03-24 NOTE — Progress Notes (Signed)
Virtual Visit Consent   Shannon Sharp, you are scheduled for a virtual visit with a [facility] provider today. Just as with appointments in the office, your consent must be obtained to participate. Your consent will be active for this visit and any virtual visit you may have with one of our providers in the next 365 days. If you have a MyChart account, a copy of this consent can be sent to you electronically.  As this is a virtual visit, video technology does not allow for your provider to perform a traditional examination. This may limit your provider's ability to fully assess your condition. If your provider identifies any concerns that need to be evaluated in person or the need to arrange testing (such as labs, EKG, etc.), we will make arrangements to do so. Although advances in technology are sophisticated, we cannot ensure that it will always work on either your end or our end. If the connection with a video visit is poor, the visit may have to be switched to a telephone visit. With either a video or telephone visit, we are not always able to ensure that we have a secure connection.  By engaging in this virtual visit, you consent to the provision of healthcare and authorize for your insurance to be billed (if applicable) for the services provided during this visit. Depending on your insurance coverage, you may receive a charge related to this service.  I need to obtain your verbal consent now. Are you willing to proceed with your visit today? Shannon Sharp has provided verbal consent on 03/24/2022 for a virtual visit (video or telephone). Piedad Climes, [place]  Date: 03/24/2022 1:57 PM  Virtual Visit via Video Note   I, Piedad Climes, connected with  Shannon Sharp  ([id number], 23-Apr-1966) on 03/24/22 at  1:30 PM EDT by a video-enabled telemedicine application and verified that I am speaking with the correct person using two  identifiers.  Location: Patient: Virtual Visit Location Patient: Home Provider: Virtual Visit Location Provider: Home Office   I discussed the limitations of evaluation and management by telemedicine and the availability of in person appointments. The patient expressed understanding and agreed to proceed.    History of Present Illness: Shannon Sharp is a 56 y.o. who identifies as a female who was assigned female at birth, and is being seen today for medication refill. Patient with history of chronic, intermittent nausea/vomiting and diarrhea, followed by Gastroenterology. Has done well for some time, but over the pat week has had a flare of symptoms. Denies fever, chills, melena, hematochezia or tenesmus. Denies hematemesis. Denies recent travel or sick contact. Denies mucous in stool. Has appt with her GI on 6/13 but cannot get refill of her zofran until that appointment. Is able to hydrate.   HPI: HPI  Problems: There are no problems to display for this patient.   Allergies: Not on File Medications:  Current Outpatient Medications:    loperamide (IMODIUM) 2 MG capsule, Take 1 capsule (2 mg total) by mouth as needed for diarrhea or loose stools., Disp: 30 capsule, Rfl: 0   ondansetron (ZOFRAN-ODT) 4 MG disintegrating tablet, Take 1 tablet (4 mg total) by mouth every 8 (eight) hours as needed for nausea or vomiting., Disp: 20 tablet, Rfl: 0  Observations/Objective: Patient is well-developed, well-nourished in no acute distress.  Resting comfortably at home.  Head is normocephalic, atraumatic.  No labored breathing. Speech is clear and coherent with logical content.  Patient is alert  and oriented at baseline.   Assessment and Plan: 1. Chronic diarrhea - ondansetron (ZOFRAN-ODT) 4 MG disintegrating tablet; Take 1 tablet (4 mg total) by mouth every 8 (eight) hours as needed for nausea or vomiting.  Dispense: 20 tablet; Refill: 0 - loperamide (IMODIUM) 2 MG capsule; Take 1  capsule (2 mg total) by mouth as needed for diarrhea or loose stools.  Dispense: 30 capsule; Refill: 0  Refills given in setting of chronic, intermittent issue until follow-up with her specialist but discussed with her if not calming down in a couple of days like usual, or any new/worsening symptoms, she will need in-person evaluation ASAP either with her GI or at local Urgent Care.   Follow Up Instructions: I discussed the assessment and treatment plan with the patient. The patient was provided an opportunity to ask questions and all were answered. The patient agreed with the plan and demonstrated an understanding of the instructions.  A copy of instructions were sent to the patient via MyChart unless otherwise noted below.    The patient was advised to call back or seek an in-person evaluation if the symptoms worsen or if the condition fails to improve as anticipated.  Time:  I spent 10 minutes with the patient via telehealth technology discussing the above problems/concerns.    Piedad Climes, PA-C

## 2022-04-08 DIAGNOSIS — L0102 Bockhart's impetigo: Secondary | ICD-10-CM | POA: Diagnosis not present

## 2022-04-12 ENCOUNTER — Ambulatory Visit: Payer: Medicare Other | Admitting: Physician Assistant

## 2022-05-30 ENCOUNTER — Ambulatory Visit (INDEPENDENT_AMBULATORY_CARE_PROVIDER_SITE_OTHER): Payer: Medicare Other | Admitting: Physician Assistant

## 2022-05-30 ENCOUNTER — Encounter: Payer: Self-pay | Admitting: Physician Assistant

## 2022-05-30 VITALS — BP 134/78 | HR 68 | Ht 66.0 in | Wt 224.0 lb

## 2022-05-30 DIAGNOSIS — K529 Noninfective gastroenteritis and colitis, unspecified: Secondary | ICD-10-CM | POA: Diagnosis not present

## 2022-05-30 DIAGNOSIS — R112 Nausea with vomiting, unspecified: Secondary | ICD-10-CM

## 2022-05-30 DIAGNOSIS — K8020 Calculus of gallbladder without cholecystitis without obstruction: Secondary | ICD-10-CM | POA: Diagnosis not present

## 2022-05-30 MED ORDER — ONDANSETRON 4 MG PO TBDP: 4.0000 mg | ORAL_TABLET | Freq: Three times a day (TID) | ORAL | 3 refills | Status: DC | PRN

## 2022-05-30 MED ORDER — DICYCLOMINE HCL 10 MG PO CAPS: 10.0000 mg | ORAL_CAPSULE | Freq: Three times a day (TID) | ORAL | 2 refills | Status: AC

## 2022-05-30 NOTE — Patient Instructions (Signed)
If you are age 57 or older, your body mass index should be between 23-30. Your Body mass index is 36.15 kg/m. If this is out of the aforementioned range listed, please consider follow up with your Primary Care Provider.  If you are age 53 or younger, your body mass index should be between 19-25. Your Body mass index is 36.15 kg/m. If this is out of the aformentioned range listed, please consider follow up with your Primary Care Provider.   You have been scheduled for a gastric emptying scan at [facility] on 06/02/22 at 9am. Please arrive at least 15 minutes prior to your appointment for registration. Please make certain not to have anything to eat or drink after midnight the night before your test. Hold all stomach medications (ex: Zofran, phenergan, Reglan) 48 hours prior to your test. If you need to reschedule your appointment, please contact radiology scheduling at [phone].  Please got to Entrance [facility] [employer] children[facility] )   A gastric-emptying study measures how long it takes for food to move through your stomach. There are several ways to measure stomach emptying. In the most common test, you eat food that contains a small amount of radioactive material. A scanner that detects the movement of the radioactive material is placed over your abdomen to monitor the rate at which food leaves your stomach. This test normally takes about 4 hours to complete. The [facility] providers would like to encourage you to use [facility] to communicate with providers for non-urgent requests or questions.  Due to long hold times on the telephone, sending your provider a message by [facility] may be a faster and more efficient way to get a response.  Please allow 48 business hours for a response.  Please remember that this is for non-urgent requests.   It was a pleasure to see you today!  Thank you for trusting me with your gastrointestinal care!    Hyacinth Meeker, PA-C

## 2022-05-30 NOTE — Progress Notes (Signed)
Chief Complaint: Diarrhea, nausea and vomiting  HPI:    [name] is a 56 year old African-American female with past medical history as listed below, known to Dr. Lavon Paganini, who returns to clinic today for follow-up of her diarrhea, nausea and vomiting.    10/2017 EGD and colonoscopy were normal other than internal hemorrhoids.    12/22/2020 patient seen by Dr. Lavon Paganini in clinic for nausea and abdominal pain.  At that time discussed history of a large gallstone.  Recommended repeat right upper quadrant ultrasound also recommended fiber given IBS-D and fecal incontinence.  Also continue Colestid 1 g twice daily.    01/04/2021 right upper quadrant ultrasound with cholelithiasis and right nephrolithiasis.  Discussed that if she had persistent nausea and abdominal pain and would refer to [facility] for symptomatic gallstones and possible cholecystectomy.    08/23/2021 patient described having diarrhea and at that time Dr. Lavon Paganini recommended GI path panel.  Also consider Xifaxan 550 3 times daily for IBS-D.    12/15/2021 patient requested a refill of Zofran.    03/24/2022 patient had a telemedicine visit with PCP and requested Zofran and Loperamide.    Today, patient tells me she continues with symptoms.  Taking the Cholestyramine never helped so she stopped taking it.  Tells me she will have at least 3 soft/liquidy stools a day and then randomly about 4 out of 7 days of the week will vomit.  Tells me that she always has symptoms but sometimes they are worse than others.  She never followed up on referral to [facility].  Tells me she does not have a lot of abdominal pain with her symptoms.  She uses Imodium 2 tabs at once and this will hold her over for the next day.  Always uses Zofran as needed.    Denies fever, chills, weight loss, blood in her stool or symptoms that awaken her from sleep.  Past Medical History:  Diagnosis Date   Allergy    Anxiety    Bipolar 2 disorder (HCC)    Cholelithiasis 02/2017   DDD  (degenerative disc disease), cervical    Depression    Diarrhea    Fecal incontinence    GERD (gastroesophageal reflux disease)    HTN (hypertension)    Internal hemorrhoids 10/2017   noted on colonoscopy   Leg swelling    Morbid obesity (HCC)    OA (osteoarthritis)    OSA on CPAP    Peristalsis 12/2017   weak and frequent failed noted on esophageal manometry   Seizure (HCC) 02/2018   Urinary incontinence    UTI (urinary tract infection) 04/10/2018   Hospitalized    Past Surgical History:  Procedure Laterality Date   65 HOUR PH STUDY N/A 01/08/2018   Procedure: 24 HOUR PH STUDY;  Surgeon: Napoleon Form, MD;  Location: [facility];  Service: Endoscopy;  Laterality: N/A;   COLONOSCOPY  11/08/2017   ESOPHAGEAL MANOMETRY N/A 01/08/2018   Procedure: ESOPHAGEAL MANOMETRY (EM);  Surgeon: Napoleon Form, MD;  Location: [facility];  Service: Endoscopy;  Laterality: N/A;   ESOPHAGOGASTRODUODENOSCOPY ENDOSCOPY  11/08/2017    Current Outpatient Medications  Medication Sig Dispense Refill   amLODipine (NORVASC) 5 MG tablet 1 tablet daily.     busPIRone (BUSPAR) 10 MG tablet Take 10 mg by mouth 2 (two) times daily.     DULoxetine (CYMBALTA) 60 MG capsule Take 60 mg by mouth at bedtime.     irbesartan (AVAPRO) 300 MG tablet 1 tablet  irbesartan-hydrochlorothiazide (AVALIDE) 300-12.5 MG tablet 1 tablet daily.     lamoTRIgine (LAMICTAL) 25 MG tablet 1 tablet qam and 2 qhs     loperamide (IMODIUM) 2 MG capsule Take 1 capsule (2 mg total) by mouth as needed for diarrhea or loose stools. 30 capsule 0   ondansetron (ZOFRAN ODT) 4 MG disintegrating tablet Take 1 tablet (4 mg total) by mouth daily in the afternoon. As needed 30 tablet 1   ondansetron (ZOFRAN) 8 MG tablet Take 1 tablet (8 mg total) by mouth every 8 (eight) hours as needed for nausea or vomiting. 30 tablet 5   ondansetron (ZOFRAN-ODT) 4 MG disintegrating tablet Take 1 tablet (4 mg total) by mouth every 8 (eight) hours  as needed for nausea or vomiting. 20 tablet 0   promethazine (PHENERGAN) 12.5 MG tablet TAKE 1 TABLET BY MOUTH TWICE DAILY AS NEEDED FOR NAUSEA FOR VOMITING 60 tablet 0   tolterodine (DETROL LA) 4 MG 24 hr capsule Take 4 mg by mouth daily.     topiramate (TOPAMAX) 100 MG tablet Take 300 mg by mouth daily after supper.      topiramate (TOPAMAX) 100 MG tablet 3 tablet     TRAZODONE HCL PO Take 75 mg by mouth daily.     Buprenorphine HCl-Naloxone HCl 4-1 MG FILM Suboxone 4 mg-1 mg sublingual film  Place 1 film twice a day by sublingual route. (Patient not taking: Reported on 05/30/2022)     colestipol (COLESTID) 1 g tablet Take 1 tablet (1 g total) by mouth 2 (two) times daily. (Patient not taking: Reported on 05/30/2022) 60 tablet 1   escitalopram (LEXAPRO) 20 MG tablet 1 tablet (Patient not taking: Reported on 05/30/2022)     famotidine (PEPCID) 40 MG tablet Take 1 tablet (40 mg total) by mouth daily as needed for heartburn or indigestion. (Patient not taking: Reported on 05/30/2022) 30 tablet 11   QUEtiapine (SEROQUEL) 50 MG tablet Take 50 mg by mouth at bedtime. (Patient not taking: Reported on 05/30/2022)     Current Facility-Administered Medications  Medication Dose Route Frequency Provider Last Rate Last Admin   bupivacaine (MARCAINE) 0.5 % (with pres) injection 3 mL  3 mL Other Once Tyrell Antonio, MD        Allergies as of 05/30/2022 - Review Complete 05/30/2022  Allergen Reaction Noted   Penicillins Itching and Other (See Comments) 02/22/2012   Penicillins Itching 02/14/2022   Sulfa antibiotics Swelling 02/22/2012   Sulfa antibiotics  02/14/2022    Family History  Problem Relation Age of Onset   Hypertension Mother    Hypertension Father    Diabetes Paternal Aunt    Diabetes Paternal Uncle    Colon cancer Neg Hx    Stomach cancer Neg Hx    Pancreatic cancer Neg Hx     Social History   Socioeconomic History   Marital status: Single    Spouse name: Not on file   Number of  children: 2   Years of education: 12   Highest education level: Not on file  Occupational History   Occupation: Disability  Tobacco Use   Smoking status: Never   Smokeless tobacco: Never  Vaping Use   Vaping Use: Never used  Substance and Sexual Activity   Alcohol use: Not Currently   Drug use: No   Sexual activity: Not Currently    Birth control/protection: I.U.D.  Other Topics Concern   Not on file  Social History Narrative   Fun/Hobby: Attend classes at the  [employer]; working on going to the gym.    Social Determinants of Health   Financial Resource Strain: Not on file  Food Insecurity: Not on file  Transportation Needs: Not on file  Physical Activity: Not on file  Stress: Not on file  Social Connections: Not on file  Intimate Partner Violence: Not on file    Review of Systems:    Constitutional: No weight loss, fever or chills Cardiovascular: No chest pain Respiratory: No SOB Gastrointestinal: See HPI and otherwise negative   Physical Exam:  Vital signs: BP 134/78   Pulse 68   Ht 5\' 6"  (1.676 m)   Wt 224 lb (101.6 kg)   SpO2 99%   BMI 36.15 kg/m    Constitutional:   Pleasant obese AA female appears to be in NAD, Well developed, Well nourished, alert and cooperative Respiratory: Respirations even and unlabored. Lungs clear to auscultation bilaterally.   No wheezes, crackles, or rhonchi.  Cardiovascular: Normal S1, S2. No MRG. Regular rate and rhythm. No peripheral edema, cyanosis or pallor.  Gastrointestinal:  Soft, nondistended, nontender. No rebound or guarding. Normal bowel sounds. No appreciable masses or hepatomegaly. Rectal:  Not performed.  Psychiatric: Oriented to person, place and time. Demonstrates good judgement and reason without abnormal affect or behaviors.  RELEVANT LABS AND IMAGING: CBC    Component Value Date/Time   WBC 7.8 10/27/2020 1422   RBC 5.54 (H) 10/27/2020 1422   HGB 13.1 10/27/2020 1422   HCT 42.0 10/27/2020  1422   PLT 274.0 10/27/2020 1422   MCV 75.9 (L) 10/27/2020 1422   MCH 23.5 (L) 04/12/2018 0646   MCHC 31.2 10/27/2020 1422   RDW 15.3 10/27/2020 1422   LYMPHSABS 2.2 10/27/2020 1422   MONOABS 0.7 10/27/2020 1422   EOSABS 0.1 10/27/2020 1422   BASOSABS 0.1 10/27/2020 1422    CMP     Component Value Date/Time   NA 138 12/22/2020 1554   K 3.8 12/22/2020 1554   CL 105 12/22/2020 1554   CO2 25 12/22/2020 1554   GLUCOSE 90 12/22/2020 1554   BUN 14 12/22/2020 1554   CREATININE 1.18 12/22/2020 1554   CALCIUM 9.7 12/22/2020 1554   PROT 7.6 12/22/2020 1554   ALBUMIN 4.1 12/22/2020 1554   AST 13 12/22/2020 1554   ALT 10 12/22/2020 1554   ALKPHOS 70 12/22/2020 1554   BILITOT 0.5 12/22/2020 1554   GFRNONAA 59 (L) 04/12/2018 0646   GFRAA >60 04/12/2018 0646    Assessment: 1.  Nausea and vomiting: About 4/7 days of the week, prior EGD and manometry normal, sometimes vomits undigested food; consider gastroparesis versus functional dyspepsia versus relation to cholelithiasis 2.  Chronic diarrhea: No help from cholestyramine in the past, recently normal colonoscopy; likely IBS or related to below 3.  Cholelithiasis: Seen at time of past ultrasounds, previously Dr. Silverio Decamp and discussed referral to [facility] if ongoing symptoms  Plan: 1.  Ordered gastric emptying study to consider gastroparesis. 2.  Started the patient on Dicyclomine 10 mg 3 times daily, 20 to 30 minutes before meals.  #90 with 3 refills.  Did discuss that she should not take this medication for 48 hours prior to gastric emptying study. 3.  Refilled Zofran 4 mg 1-2 tabs every 4-6 hours as needed for nausea #30 with 3 refills.  Also discussed not using this 48 hours before gastric emptying study. 4.  Pending results from above and how she feels on the Dicyclomine could consider referral to [facility] for possible cholecystectomy.  We discussed this in detail today.  She would like to wait on the referral. 5.  Patient to follow with me in  2 months or sooner if necessary.  Ellouise Newer, PA-C [facility] 05/30/2022, 1:42 PM  Cc: Wenda Low, MD

## 2022-05-31 DIAGNOSIS — G894 Chronic pain syndrome: Secondary | ICD-10-CM | POA: Diagnosis not present

## 2022-05-31 DIAGNOSIS — M5416 Radiculopathy, lumbar region: Secondary | ICD-10-CM | POA: Diagnosis not present

## 2022-06-01 DIAGNOSIS — R569 Unspecified convulsions: Secondary | ICD-10-CM | POA: Diagnosis not present

## 2022-06-01 DIAGNOSIS — I10 Essential (primary) hypertension: Secondary | ICD-10-CM | POA: Diagnosis not present

## 2022-06-01 DIAGNOSIS — L309 Dermatitis, unspecified: Secondary | ICD-10-CM | POA: Diagnosis not present

## 2022-06-01 DIAGNOSIS — N183 Chronic kidney disease, stage 3 unspecified: Secondary | ICD-10-CM | POA: Diagnosis not present

## 2022-06-01 DIAGNOSIS — Z0000 Encounter for general adult medical examination without abnormal findings: Secondary | ICD-10-CM | POA: Diagnosis not present

## 2022-06-01 DIAGNOSIS — G8929 Other chronic pain: Secondary | ICD-10-CM | POA: Diagnosis not present

## 2022-06-01 DIAGNOSIS — G4733 Obstructive sleep apnea (adult) (pediatric): Secondary | ICD-10-CM | POA: Diagnosis not present

## 2022-06-01 DIAGNOSIS — M1990 Unspecified osteoarthritis, unspecified site: Secondary | ICD-10-CM | POA: Diagnosis not present

## 2022-06-01 DIAGNOSIS — R7303 Prediabetes: Secondary | ICD-10-CM | POA: Diagnosis not present

## 2022-06-02 ENCOUNTER — Encounter (HOSPITAL_COMMUNITY): Payer: Medicare Other

## 2022-06-15 DIAGNOSIS — R945 Abnormal results of liver function studies: Secondary | ICD-10-CM | POA: Diagnosis not present

## 2022-06-27 ENCOUNTER — Encounter (HOSPITAL_COMMUNITY)
Admission: RE | Admit: 2022-06-27 | Discharge: 2022-06-27 | Disposition: A | Discharge status: Home / Self Care | Payer: Medicare Other | Source: Ambulatory Visit | Attending: Physician Assistant | Admitting: Physician Assistant

## 2022-06-27 ENCOUNTER — Telehealth: Payer: Self-pay | Admitting: Physician Assistant

## 2022-06-27 DIAGNOSIS — K8020 Calculus of gallbladder without cholecystitis without obstruction: Secondary | ICD-10-CM | POA: Diagnosis not present

## 2022-06-27 DIAGNOSIS — R112 Nausea with vomiting, unspecified: Secondary | ICD-10-CM

## 2022-06-27 MED ORDER — TECHNETIUM TC 99M SULFUR COLLOID
2.0000 | Freq: Once | INTRAVENOUS | Status: AC | PRN
  Administered 2022-06-27: 2 via ORAL

## 2022-06-27 NOTE — Telephone Encounter (Signed)
Pt calling for GES results. Please advise.

## 2022-06-27 NOTE — Telephone Encounter (Signed)
Patient had a Nuclear Medicine study this morning and wanted to know what her results were.  Please give patient a call and advise.  Thank you.

## 2022-06-28 ENCOUNTER — Other Ambulatory Visit: Payer: Self-pay

## 2022-06-28 MED ORDER — RIFAXIMIN 550 MG PO TABS: 550.0000 mg | ORAL_TABLET | Freq: Three times a day (TID) | ORAL | 0 refills | Status: AC

## 2022-06-28 NOTE — Telephone Encounter (Signed)
Pt aware, script sent to pharmacy. 

## 2022-06-28 NOTE — Telephone Encounter (Signed)
Pt reports she has been taking the dicyclomine qid and it is not helping much with diarrhea. Reports she is having diarrhea at least 2 times a day. Please advise.

## 2022-06-28 NOTE — Telephone Encounter (Signed)
Inbound call from patient stating she would like something prescribed for diarrhea. Please give patient a call back to advise.

## 2022-06-29 ENCOUNTER — Other Ambulatory Visit (HOSPITAL_COMMUNITY): Payer: Self-pay

## 2022-07-06 ENCOUNTER — Telehealth: Payer: Self-pay

## 2022-07-06 ENCOUNTER — Other Ambulatory Visit (HOSPITAL_COMMUNITY): Payer: Self-pay

## 2022-07-06 NOTE — Telephone Encounter (Addendum)
Patient Advocate Encounter   Received notification from [employer] that prior authorization is required for Xifaxan 550MG . PA submitted and APPROVED on 07/06/2022.  Key 09/05/2022 Effective: 07/06/2022 - 07/20/2022  07/22/2022, CPhT Rx Patient Advocate Phone: [phone]

## 2022-07-06 NOTE — Telephone Encounter (Signed)
error 

## 2022-09-07 DIAGNOSIS — M13861 Other specified arthritis, right knee: Secondary | ICD-10-CM | POA: Diagnosis not present

## 2022-09-07 DIAGNOSIS — M1712 Unilateral primary osteoarthritis, left knee: Secondary | ICD-10-CM | POA: Diagnosis not present

## 2022-09-07 DIAGNOSIS — M1711 Unilateral primary osteoarthritis, right knee: Secondary | ICD-10-CM | POA: Diagnosis not present

## 2022-09-07 DIAGNOSIS — M5416 Radiculopathy, lumbar region: Secondary | ICD-10-CM | POA: Diagnosis not present

## 2022-09-07 DIAGNOSIS — G894 Chronic pain syndrome: Secondary | ICD-10-CM | POA: Diagnosis not present

## 2022-09-07 DIAGNOSIS — M170 Bilateral primary osteoarthritis of knee: Secondary | ICD-10-CM | POA: Diagnosis not present

## 2022-11-03 ENCOUNTER — Telehealth: Payer: Self-pay | Admitting: Physical Medicine and Rehabilitation

## 2022-11-03 NOTE — Telephone Encounter (Signed)
Patient called in stating she needs appt for Ablation please advise

## 2022-11-04 ENCOUNTER — Encounter: Payer: Self-pay | Admitting: Physical Medicine and Rehabilitation

## 2022-11-04 ENCOUNTER — Ambulatory Visit (INDEPENDENT_AMBULATORY_CARE_PROVIDER_SITE_OTHER): Payer: Medicare Other | Admitting: Physical Medicine and Rehabilitation

## 2022-11-04 VITALS — BP 141/84 | HR 69

## 2022-11-04 DIAGNOSIS — M25562 Pain in left knee: Secondary | ICD-10-CM | POA: Diagnosis not present

## 2022-11-04 DIAGNOSIS — G8929 Other chronic pain: Secondary | ICD-10-CM

## 2022-11-04 DIAGNOSIS — G894 Chronic pain syndrome: Secondary | ICD-10-CM

## 2022-11-04 DIAGNOSIS — M170 Bilateral primary osteoarthritis of knee: Secondary | ICD-10-CM

## 2022-11-04 NOTE — Progress Notes (Unsigned)
Shannon Sharp - 57 y.o. female MRN [id number]  Date of birth: 18-Jun-1966  Office Visit Note: Visit Date: 11/04/2022 PCP: Wenda Low, MD Referred by: Wenda Low, MD  Subjective: Chief Complaint  Patient presents with   Left Knee - Follow-up, Pain    Previous nerve block 12/2021 lasted 8 months   HPI: Shannon Sharp is a 57 y.o. female who comes in today for evaluation of chronic, worsening and severe left sided knee pain. Patient was originally referred to [place] by Dr. Suella Broad at [facility] in February of last year. She reports pain ongoing for over 3 years and worsens with movement and activity. She describes pain as a sore and aching, currently rates as 8 out of 10. Some relief of pain with home exercise regimen, rest and use of medications. Her chronic pain is being managed by Benedetto Goad, NP at [facility], she is taking Oxymorphone 10 mg tablets three times a day. History of formal physical therapy and reports no relief of pain with these treatments. Bilateral knee x-rays from 2019 exhibit tricompartmental degenerative changes. Patient was scheduled to have left total knee arthroplasty with Dr. Gaynelle Arabian in December of 2022 with plans to later complete same procedure on the right, however patient was not able to get scheduled time off work for recovery. Patient states she is not interested in surgical intervention at this time. Patient did undergo left genicular nerve block in our office on 12/23/21, she reports greater than 80% relief for 8 months. Also reports increased functionality post injection. 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 Patient denies focal weakness, numbness and tingling. Patient denies recent trauma or falls.    Review of Systems  Musculoskeletal:  Positive for joint pain.  Neurological:  Negative for tingling, sensory change, focal weakness and weakness.  All other systems reviewed and are negative.  Otherwise per HPI.  Assessment  & Plan: Visit Diagnoses:    ICD-10-CM   1. Chronic pain of left knee  M25.562 Ambulatory referral to Physical Medicine Rehab   G89.29     2. Bilateral primary osteoarthritis of knee  M17.0 Ambulatory referral to Physical Medicine Rehab    3. Chronic pain syndrome  G89.4 Ambulatory referral to Physical Medicine Rehab       Plan: Findings:  Chronic, worsening and severe left sided knee pain. Patient continues to have severe pain despite good conservative therapies such as formal physical therapy, home exercise regimen, rest and use of medications. Good relief of pain with previous left genicular nerve block. We feel next step is to perform diagnostic and hopefully therapeutic left genicular nerve block under fluoroscopic guidance. If good relief of pain with genicular block we did discuss possibility of longer sustained pain relief with radiofrequency ablation. I did provide patient with educational literature regarding radiofrequency ablation procedure to take home and review. If her pain persists post injection we encouraged her to follow up with orthopedist for re-evaluation. Patient instructed to continue with current medication regimen and chronic pain management. No red flag symptoms noted upon exam today.     Meds & Orders: No orders of the defined types were placed in this encounter.   Orders Placed This Encounter  Procedures   Ambulatory referral to Physical Medicine Rehab    Follow-up: Return for Left genicular nerve block.   Procedures: No procedures performed      Clinical History: No specialty comments available.   She reports that she has never smoked. She has never  used smokeless tobacco. No results for input(s): "HGBA1C", "LABURIC" in the last 8760 hours.  Objective:  VS:  HT:    WT:   BMI:     BP:(!) 141/84  HR:69bpm  TEMP: ( )  RESP:97 % Physical Exam Vitals and nursing note reviewed.  HENT:     Head: Normocephalic and atraumatic.     Right Ear: External ear  normal.     Left Ear: External ear normal.     Nose: Nose normal.     Mouth/Throat:     Mouth: Mucous membranes are moist.  Eyes:     Extraocular Movements: Extraocular movements intact.  Cardiovascular:     Rate and Rhythm: Normal rate.     Pulses: Normal pulses.  Pulmonary:     Effort: Pulmonary effort is normal.  Abdominal:     General: Abdomen is flat. There is no distension.  Musculoskeletal:        General: Tenderness present.     Cervical back: Normal range of motion.     Comments: Full active and passive range of motion noted with both knees. No crepitus noted. No swelling noted. Tenderness noted to medial and lateral joint lines of left knee. Walks independently, gait steady.   Skin:    General: Skin is warm and dry.     Capillary Refill: Capillary refill takes less than 2 seconds.  Neurological:     General: No focal deficit present.     Mental Status: She is alert and oriented to person, place, and time.  Psychiatric:        Mood and Affect: Mood normal.        Behavior: Behavior normal.     Ortho Exam  Imaging: No results found.  Past Medical/Family/Surgical/Social History: Medications & Allergies reviewed per EMR, new medications updated. Patient Active Problem List   Diagnosis Date Noted   Attention deficit hyperactivity disorder 12/15/2021   Chronic pain syndrome 04/13/2021   Nausea with vomiting 10/28/2020   Incontinence of feces 02/12/2020   Osteoarthritis of knee 05/29/2018   CKD (chronic kidney disease), stage III ([place]) 04/11/2018   Bacteremia 04/10/2018   Acute lower UTI 04/10/2018   Heartburn    Dysphagia    Seasonal allergic rhinitis 05/08/2017   Gastroesophageal reflux disease with esophagitis 05/08/2017   Urinary frequency 03/02/2017   Acute pain of both knees 03/02/2017   Class 3 severe obesity due to excess calories without serious comorbidity with body mass index (BMI) of 45.0 to 49.9 in adult ([place]) 03/02/2017   OBESITY, NOS 12/28/2006    ANEMIA, IRON DEFICIENCY, UNSPEC. 12/28/2006   DEPRESSION, MAJOR, RECURRENT 12/28/2006   Bipolar 2 disorder ([place]) 12/28/2006   ANXIETY 12/28/2006   PANIC ATTACKS 12/28/2006   OBSESSIVE COMPUL. DISORDER 12/28/2006   ATTENTION DEFICIT, W/HYPERACTIVITY 12/28/2006   HEARING LOSS NOS OR DEAFNESS 12/28/2006   Atopic dermatitis 12/28/2006   INSOMNIA NOS 12/28/2006   Past Medical History:  Diagnosis Date   Allergy    Anxiety    Bipolar 2 disorder ([place])    Cholelithiasis 02/2017   DDD (degenerative disc disease), cervical    Depression    Diarrhea    Fecal incontinence    GERD (gastroesophageal reflux disease)    HTN (hypertension)    Internal hemorrhoids 10/2017   noted on colonoscopy   Leg swelling    Morbid obesity (HCC)    OA (osteoarthritis)    OSA on CPAP    Peristalsis 12/2017   weak and frequent failed  noted on esophageal manometry   Seizure ([place]) 02/2018   Urinary incontinence    UTI (urinary tract infection) 04/10/2018   Hospitalized   Family History  Problem Relation Age of Onset   Hypertension Mother    Hypertension Father    Diabetes Paternal Aunt    Diabetes Paternal Uncle    Colon cancer Neg Hx    Stomach cancer Neg Hx    Pancreatic cancer Neg Hx    Past Surgical History:  Procedure Laterality Date   27 HOUR [place] STUDY N/A 01/08/2018   Procedure: 24 HOUR PH STUDY;  Surgeon: Mauri Pole, MD;  Location: [facility];  Service: Endoscopy;  Laterality: N/A;   COLONOSCOPY  11/08/2017   ESOPHAGEAL MANOMETRY N/A 01/08/2018   Procedure: ESOPHAGEAL MANOMETRY (EM);  Surgeon: Mauri Pole, MD;  Location: [facility];  Service: Endoscopy;  Laterality: N/A;   ESOPHAGOGASTRODUODENOSCOPY ENDOSCOPY  11/08/2017   Social History   Occupational History   Occupation: Disability  Tobacco Use   Smoking status: Never   Smokeless tobacco: Never  Vaping Use   Vaping Use: Never used  Substance and Sexual Activity   Alcohol use: Not Currently   Drug use: No    Sexual activity: Not Currently    Birth control/protection: I.U.D.

## 2022-11-14 ENCOUNTER — Telehealth: Payer: Self-pay | Admitting: Physical Medicine and Rehabilitation

## 2022-11-14 NOTE — Telephone Encounter (Signed)
Patients she missed a call from Dr. Romona Curls office. Please call and advise. Patient staes to leave voicemail, sometimes her phone goes straight to VM

## 2022-11-14 NOTE — Telephone Encounter (Signed)
I called.

## 2022-11-21 ENCOUNTER — Ambulatory Visit (INDEPENDENT_AMBULATORY_CARE_PROVIDER_SITE_OTHER): Payer: 59 | Admitting: Physical Medicine and Rehabilitation

## 2022-11-21 ENCOUNTER — Ambulatory Visit: Payer: Self-pay

## 2022-11-21 ENCOUNTER — Encounter: Payer: Self-pay | Admitting: Physical Medicine and Rehabilitation

## 2022-11-21 VITALS — BP 145/84 | HR 62 | Ht 66.0 in | Wt 235.0 lb

## 2022-11-21 DIAGNOSIS — M25562 Pain in left knee: Secondary | ICD-10-CM | POA: Diagnosis not present

## 2022-11-21 DIAGNOSIS — G8929 Other chronic pain: Secondary | ICD-10-CM | POA: Diagnosis not present

## 2022-11-21 NOTE — Progress Notes (Signed)
Functional Pain Scale - descriptive words and definitions  Moderate (4)   Constantly aware of pain, can complete ADLs with modification/sleep marginally affected at times/passive distraction is of no use, but active distraction gives some relief. Moderate range order  Average Pain 4  Left knee pain. Good relief from prior left genicular block. Patient did experience quite a bit of pain post injection last time. No complications post last block.   +Driver, -BT, -Dye Allergies.

## 2022-11-21 NOTE — Patient Instructions (Signed)

## 2022-11-23 ENCOUNTER — Telehealth: Payer: Self-pay | Admitting: Physical Medicine and Rehabilitation

## 2022-11-23 NOTE — Telephone Encounter (Signed)
Pt called requesting a call to set an appt for her other leg to get an abrasion. Please call pt at [phone].

## 2022-11-24 ENCOUNTER — Other Ambulatory Visit: Payer: Self-pay | Admitting: Physical Medicine and Rehabilitation

## 2022-11-24 DIAGNOSIS — G8929 Other chronic pain: Secondary | ICD-10-CM

## 2022-11-24 NOTE — Telephone Encounter (Signed)
Spoke with patient and she stated she is now having pain in the right knee. She stated the left knee is doing fine.  Please advise

## 2022-11-24 NOTE — Telephone Encounter (Signed)
Spoke with patient and informed her that a referral was placed for her right knee and once approved by insurance, we will schedule her. Verbalized understnding

## 2022-11-29 MED ORDER — BUPIVACAINE HCL 0.5 % IJ SOLN
5.0000 mL | INTRAMUSCULAR | Status: AC | PRN
  Administered 2022-11-21: 5 mL via INTRA_ARTICULAR

## 2022-11-29 NOTE — Progress Notes (Signed)
Shannon Sharp - 57 y.o. female MRN [id number]  Date of birth: 1965-12-04  Office Visit Note: Visit Date: 11/21/2022 PCP: Wenda Low, MD Referred by: Wenda Low, MD  Subjective: Chief Complaint  Patient presents with   Left Knee - Pain   HPI:  Shannon Sharp is a 57 y.o. female who comes in today at the request of Barnet Pall, FNP for planned Left  Genicular nerve block block with fluoroscopic guidance.  The patient has failed conservative care including injections, home exercise, medications, time and activity modification.  Nerve block will be performed in a double block paradigm with goal towards radiofrequency ablation of the same genicular nerves for longer term definitive care.  Please see requesting physician notes for further details and justification.     ROS Otherwise per HPI.  Assessment & Plan: Visit Diagnoses:    ICD-10-CM   1. Chronic pain of left knee  M25.562 XR C-ARM NO REPORT   G89.29       Plan: No additional findings.   Meds & Orders: No orders of the defined types were placed in this encounter.   Orders Placed This Encounter  Procedures   Large Joint Inj   XR C-ARM NO REPORT    Follow-up: Return for Review Pain Diary.   Procedures: Large Joint Inj: L knee on 11/21/2022 9:00 AM Indications: pain and diagnostic evaluation Details: 25 G 3.5 in needle, fluoroscopy-guided anterior approach Medications: 5 mL bupivacaine 0.5 % Outcome: tolerated well, no immediate complications  Genicular Nerve Blockade with Fluoroscopic Guidance  Patient: Shannon Sharp      Date of Birth: 09/30/1966 MRN: [id number] PCP: Wenda Low, MD      Visit Date: @ENCDATE @   Universal Protocol:    Date/Time: 01/30/245:46 PM  Consent Given By: the patient  Position: SUPINE  Additional Comments: Vital signs were monitored before and after the procedure. Patient was prepped and draped in the usual sterile fashion. The correct patient,  procedure, and site was verified.   Injection Procedure Details:  Procedure Site One Meds Administered: No orders of the defined types were placed in this encounter.    Laterality: Left  Location/Site:  Superior medial, superior lateral and inferior medial genicular nerve  Needle size: 3.5 inch 25-gauge spinal needle  Needle Placement: Just posterior to midline on the lateral view at the locations noted in the procedure note.   Findings:    -Comments: Excellent position of the needle tip using biplanar imaging with low contrast showing no vascular flow.  Procedure Details: The fluoroscope was positioned in a 90 degree anteroposterior (AP) fluoroscopic view and then tilted to obtain a true AP view of the knee joint.  The target areas are the mid-point of the curve formed where the femoral shaft and epicondyles meet as well as the midpoint of the curve on the medial side where the tibial shaft meets the plateau.  Using fluoroscopic guidance the target areas were infiltrated using a 27-gauge needle and 1-2 mL of 1% lidocaine without epinephrine to provide local anesthetic.  Then, for each target area a 3-1/2 inch 25-gauge spinal needle was driven down under intermittent fluoroscopic guidance to the periosteum of the target site.  Once all 3 needles were in place in the AP view, a lateral view was obtained at each needle was driven control to the midpoint of the shafts respectively.  A spot film was obtained of the lateral view and saved.  Next, an AP view was obtained once again  to confirm placement.  A spot film then was obtained and saved.  After fluoroscopic imaging was used to determine and confirm placement, 0.5-1 mL of injectate was delivered at each target location.  The patient was given a pain diary to complete and return to the office.      Additional Comments:  The patient tolerated the procedure well Dressing: Band-Aid    Post-procedure details: Patient was observed  during the procedure. Post-procedure instructions were reviewed.  Patient left the clinic in stable condition.         Consent was given by the patient. Immediately prior to procedure a time out was called to verify the correct patient, procedure, equipment, support staff and site/side marked as required. Patient was prepped and draped in the usual sterile fashion.          Clinical History: No specialty comments available.     Objective:  VS:  HT:5\' 6"  (167.6 cm)   WT:235 lb (106.6 kg)  BMI:37.95    BP:(!) 145/84  HR:62bpm  TEMP: ( )  RESP:  Physical Exam   Imaging: No results found.

## 2022-12-08 ENCOUNTER — Encounter: Payer: 59 | Admitting: Physical Medicine and Rehabilitation

## 2022-12-22 ENCOUNTER — Ambulatory Visit (INDEPENDENT_AMBULATORY_CARE_PROVIDER_SITE_OTHER): Payer: 59 | Admitting: Physical Medicine and Rehabilitation

## 2022-12-22 ENCOUNTER — Ambulatory Visit: Payer: Self-pay

## 2022-12-22 VITALS — BP 149/92 | HR 68

## 2022-12-22 DIAGNOSIS — G8929 Other chronic pain: Secondary | ICD-10-CM | POA: Diagnosis not present

## 2022-12-22 DIAGNOSIS — M25561 Pain in right knee: Secondary | ICD-10-CM | POA: Diagnosis not present

## 2022-12-22 NOTE — Progress Notes (Signed)
Functional Pain Scale - descriptive words and definitions  Unmanageable (7)  Pain interferes with normal ADL's/nothing seems to help/sleep is very difficult/active distractions are very difficult to concentrate on. Severe range order  Average Pain 5-7   +Driver, -BT, -Dye Allergies.  Right knee pain

## 2022-12-22 NOTE — Patient Instructions (Signed)

## 2022-12-28 MED ORDER — BUPIVACAINE HCL 0.5 % IJ SOLN
5.0000 mL | INTRAMUSCULAR | Status: AC | PRN
  Administered 2022-12-22: 5 mL via INTRA_ARTICULAR

## 2022-12-28 NOTE — Progress Notes (Signed)
Shannon Sharp - 57 y.o. female MRN [id number]  Date of birth: [birth year]  Office Visit Note: Visit Date: 12/22/2022 PCP: Wenda Low, MD Referred by: Suella Broad, MD  Subjective: Chief Complaint  Patient presents with   Right Knee - Pain   HPI:  Shannon Sharp is a 57 y.o. female who comes in today at the request of Barnet Pall, FNP Suella Broad, MD for planned Right  Genicular nerve block block with fluoroscopic guidance.  The patient has failed conservative care including injections, home exercise, medications, time and activity modification.  Nerve block will be performed in a double block paradigm with goal towards radiofrequency ablation of the same genicular nerves for longer term definitive care.  Please see requesting physician notes for further details and justification.  Interestingly, the patient had prior left-sided genicular block that seemingly helped for close to a year.  We repeated the left genicular block recently and she still having good relief on the left after the block.  We are going to do the right side today.  She is a candidate for radiofrequency ablation on the left.  She will continue to follow-up with Dr. Suella Broad for her lumbar spine.   ROS Otherwise per HPI.  Assessment & Plan: Visit Diagnoses:    ICD-10-CM   1. Chronic pain of right knee  M25.561 XR C-ARM NO REPORT   G89.29       Plan: No additional findings.   Meds & Orders: No orders of the defined types were placed in this encounter.   Orders Placed This Encounter  Procedures   Large Joint Inj   XR C-ARM NO REPORT    Follow-up: No follow-ups on file.   Procedures: Large Joint Inj: R knee on 12/22/2022 3:30 PM Indications: pain and diagnostic evaluation Details: 25 G 3.5 in needle, fluoroscopy-guided anterior approach Medications: 5 mL bupivacaine 0.5 % Outcome: tolerated well, no immediate complications  Genicular Nerve Blockade with Fluoroscopic  Guidance  Patient: Shannon Sharp      Date of Birth: [birth year] MRN: [id number] PCP: Wenda Low, MD         Universal Protocol:    Date/Time: 02/28/247:31 AM  Consent Given By: the patient  Position: SUPINE  Additional Comments: Vital signs were monitored before and after the procedure. Patient was prepped and draped in the usual sterile fashion. The correct patient, procedure, and site was verified.   Injection Procedure Details:  Procedure Site One Meds Administered: No orders of the defined types were placed in this encounter.    Laterality: Right  Location/Site:  Superior medial, superior lateral and inferior medial genicular nerve  Needle size: 3.5 inch 25-gauge spinal needle  Needle Placement: Just posterior to midline on the lateral view at the locations noted in the procedure note.   Findings:    -Comments: Excellent position of the needle tip using biplanar imaging with low contrast showing no vascular flow.  Procedure Details: The fluoroscope was positioned in a 90 degree anteroposterior (AP) fluoroscopic view and then tilted to obtain a true AP view of the knee joint.  The target areas are the mid-point of the curve formed where the femoral shaft and epicondyles meet as well as the midpoint of the curve on the medial side where the tibial shaft meets the plateau.  Using fluoroscopic guidance the target areas were infiltrated using a 27-gauge needle and 1-2 mL of 1% lidocaine without epinephrine to provide local anesthetic.  Then, for each target area a  3-1/2 inch 25-gauge spinal needle was driven down under intermittent fluoroscopic guidance to the periosteum of the target site.  Once all 3 needles were in place in the AP view, a lateral view was obtained at each needle was driven control to the midpoint of the shafts respectively.  A spot film was obtained of the lateral view and saved.  Next, an AP view was obtained once again to confirm placement.  A  spot film then was obtained and saved.  After fluoroscopic imaging was used to determine and confirm placement, 0.5-1 mL of injectate was delivered at each target location.  The patient was given a pain diary to complete and return to the office.      Additional Comments:  The patient tolerated the procedure well Dressing: Band-Aid    Post-procedure details: Patient was observed during the procedure. Post-procedure instructions were reviewed.  Patient left the clinic in stable condition.         Consent was given by the patient. Immediately prior to procedure a time out was called to verify the correct patient, procedure, equipment, support staff and site/side marked as required. Patient was prepped and draped in the usual sterile fashion.          Clinical History: No specialty comments available.     Objective:  VS:  HT:    WT:   BMI:     BP:(!) 149/92  HR:68bpm  TEMP: ( )  RESP:  Physical Exam Vitals and nursing note reviewed.  Constitutional:      General: She is not in acute distress.    Appearance: Normal appearance. She is obese. She is not ill-appearing.  HENT:     Head: Normocephalic and atraumatic.     Right Ear: External ear normal.     Left Ear: External ear normal.  Eyes:     Extraocular Movements: Extraocular movements intact.  Cardiovascular:     Rate and Rhythm: Normal rate.     Pulses: Normal pulses.  Pulmonary:     Effort: Pulmonary effort is normal. No respiratory distress.  Abdominal:     General: There is no distension.     Palpations: Abdomen is soft.  Musculoskeletal:        General: Tenderness present.     Cervical back: Neck supple.     Right lower leg: No edema.     Left lower leg: No edema.     Comments: Patient has good distal strength with no pain over the greater trochanters.  No clonus or focal weakness.  Skin:    Findings: No erythema, lesion or rash.  Neurological:     General: No focal deficit present.     Mental  Status: She is alert and oriented to person, place, and time.     Sensory: No sensory deficit.     Motor: No weakness or abnormal muscle tone.     Coordination: Coordination normal.  Psychiatric:        Mood and Affect: Mood normal.        Behavior: Behavior normal.      Imaging: No results found.

## 2023-01-26 ENCOUNTER — Telehealth: Payer: Self-pay

## 2023-01-26 ENCOUNTER — Other Ambulatory Visit: Payer: Self-pay | Admitting: Physical Medicine and Rehabilitation

## 2023-01-26 DIAGNOSIS — G8929 Other chronic pain: Secondary | ICD-10-CM

## 2023-01-26 NOTE — Telephone Encounter (Signed)
Ok to schedule inj or needs an appt?

## 2023-01-26 NOTE — Telephone Encounter (Signed)
Pt called and left vm to schedule ablation. Will call to discuss soon.

## 2023-01-26 NOTE — Telephone Encounter (Signed)
Ok to schedule ablation or follow up. Please advice.

## 2023-02-07 DIAGNOSIS — M25561 Pain in right knee: Secondary | ICD-10-CM | POA: Diagnosis not present

## 2023-02-07 DIAGNOSIS — G894 Chronic pain syndrome: Secondary | ICD-10-CM | POA: Diagnosis not present

## 2023-02-07 DIAGNOSIS — M5416 Radiculopathy, lumbar region: Secondary | ICD-10-CM | POA: Diagnosis not present

## 2023-02-07 DIAGNOSIS — M25562 Pain in left knee: Secondary | ICD-10-CM | POA: Diagnosis not present

## 2023-02-09 ENCOUNTER — Ambulatory Visit (INDEPENDENT_AMBULATORY_CARE_PROVIDER_SITE_OTHER): Payer: 59 | Admitting: Physical Medicine and Rehabilitation

## 2023-02-09 VITALS — BP 162/96 | HR 66

## 2023-02-09 DIAGNOSIS — M170 Bilateral primary osteoarthritis of knee: Secondary | ICD-10-CM | POA: Diagnosis not present

## 2023-02-09 DIAGNOSIS — M25561 Pain in right knee: Secondary | ICD-10-CM | POA: Diagnosis not present

## 2023-02-09 DIAGNOSIS — M25562 Pain in left knee: Secondary | ICD-10-CM | POA: Diagnosis not present

## 2023-02-09 DIAGNOSIS — G894 Chronic pain syndrome: Secondary | ICD-10-CM | POA: Diagnosis not present

## 2023-02-09 DIAGNOSIS — G8929 Other chronic pain: Secondary | ICD-10-CM

## 2023-02-09 NOTE — Progress Notes (Signed)
Functional Pain Scale - descriptive words and definitions  Moderate (4)   Constantly aware of pain, can complete ADLs with modification/sleep marginally affected at times/passive distraction is of no use, but active distraction gives some relief. Moderate range order  Average Pain 6   +Driver, -BT, -Dye Allergies.  Right knee pain

## 2023-02-10 DIAGNOSIS — J029 Acute pharyngitis, unspecified: Secondary | ICD-10-CM | POA: Diagnosis not present

## 2023-02-10 DIAGNOSIS — Z03818 Encounter for observation for suspected exposure to other biological agents ruled out: Secondary | ICD-10-CM | POA: Diagnosis not present

## 2023-02-10 DIAGNOSIS — R0981 Nasal congestion: Secondary | ICD-10-CM | POA: Diagnosis not present

## 2023-02-24 ENCOUNTER — Encounter: Payer: Self-pay | Admitting: Physical Medicine and Rehabilitation

## 2023-02-24 NOTE — Progress Notes (Signed)
Shannon Sharp - 57 y.o. female MRN [id number]  Date of birth: 1965-12-25  Office Visit Note: Visit Date: 02/09/2023 PCP: Georgann Housekeeper, MD Referred by: Georgann Housekeeper, MD  Subjective: Chief Complaint  Patient presents with   Right Knee - Pain   HPI:  Shannon Sharp is a 57 y.o. female who comes in today for evaluation and management at the request of Sheran Luz, MD for history of chronic recalcitrant right and left knee pain.  Historically left more than right knee pain but bilateral knee pain.  Deemed at [facility] to not be a great surgical candidate.  Patient's case complicated by anxiety and depression and bipolar disorder as well as chronic pain syndrome on multiple psychoactive medications as well as opioid medications.  Pain medications managed by Dr. Ethelene Hal as well as lower back pain managed by him.  She did well with 2 prior diagnostic genicular nerve blocks on the left.  She got more than 70 to 80% relief of those and that is documented.  She is waiting for preauthorization for ablation.  She actually had uncommon reaction to the first block where she almost got a year relief.  We have now completed right genicular nerve block without any relief.  She reports continued pain about the knee some radicular type pain.  Multifactorial pain.  No focal weakness no locking or giveaway weakness or swelling.   I spent more than 30 minutes speaking face-to-face with the patient with 50% of the time in counseling and discussing coordination of care.   Review of Systems  Musculoskeletal:  Positive for back pain and joint pain.  All other systems reviewed and are negative.  Otherwise per HPI.  Assessment & Plan: Visit Diagnoses:    ICD-10-CM   1. Chronic pain of left knee  M25.562 Ambulatory referral to Physical Medicine Rehab   G89.29     2. Bilateral primary osteoarthritis of knee  M17.0 Ambulatory referral to Physical Medicine Rehab    3. Chronic pain of right knee   M25.561    G89.29     4. Chronic pain syndrome  G89.4       Plan: Findings:  Chronic recalcitrant and severe bilateral knee pain left more than right with knee arthritis deemed to be not a great surgical candidate [name] multiple medical issues.  She is awaiting preauthorization for left radiofrequency ablation of the genicular nerves.  Genicular nerve block on the right was not successful.  She will follow-up with Dr. Ethelene Hal for continued chronic pain.    Meds & Orders: No orders of the defined types were placed in this encounter.   Orders Placed This Encounter  Procedures   Ambulatory referral to Physical Medicine Rehab    Follow-up: Return for Review Pain Diary.   Procedures: No procedures performed      Clinical History: No specialty comments available.     Objective:  VS:  HT:    WT:   BMI:     BP:(!) 162/96  HR:66bpm  TEMP: ( )  RESP:  Physical Exam Vitals and nursing note reviewed.  Constitutional:      General: She is not in acute distress.    Appearance: Normal appearance. She is well-developed. She is obese. She is not ill-appearing.  HENT:     Head: Normocephalic and atraumatic.  Eyes:     Conjunctiva/sclera: Conjunctivae normal.     Pupils: Pupils are equal, round, and reactive to light.  Cardiovascular:     Rate and  Rhythm: Normal rate.     Pulses: Normal pulses.  Pulmonary:     Effort: Pulmonary effort is normal.  Musculoskeletal:        General: Tenderness present.     Right lower leg: No edema.     Left lower leg: No edema.     Comments: Examination of both knees shows mild effusion bilaterally joint line tenderness good varus and valgus stability.  Some crepitus on the left with movement more than right.  Skin:    General: Skin is warm and dry.     Findings: No erythema or rash.  Neurological:     General: No focal deficit present.     Mental Status: She is alert and oriented to person, place, and time.     Sensory: No sensory deficit.      Motor: No abnormal muscle tone.     Coordination: Coordination normal.     Gait: Gait normal.  Psychiatric:        Mood and Affect: Mood normal.        Behavior: Behavior normal.      Imaging: No results found.

## 2023-02-28 ENCOUNTER — Other Ambulatory Visit: Payer: Self-pay

## 2023-02-28 ENCOUNTER — Ambulatory Visit (INDEPENDENT_AMBULATORY_CARE_PROVIDER_SITE_OTHER): Payer: 59 | Admitting: Physical Medicine and Rehabilitation

## 2023-02-28 VITALS — BP 152/74 | HR 85

## 2023-02-28 DIAGNOSIS — M25562 Pain in left knee: Secondary | ICD-10-CM | POA: Diagnosis not present

## 2023-02-28 DIAGNOSIS — G8929 Other chronic pain: Secondary | ICD-10-CM | POA: Diagnosis not present

## 2023-02-28 MED ORDER — METHYLPREDNISOLONE ACETATE 80 MG/ML IJ SUSP: 80.0000 mg | Freq: Once | INTRAMUSCULAR | Status: DC

## 2023-02-28 NOTE — Progress Notes (Unsigned)
Functional Pain Scale - descriptive words and definitions  Distracting (5)    Aware of pain/able to complete some ADL's but limited by pain/sleep is affected and active distractions are only slightly useful. Moderate range order  Average Pain 7   +Driver, -BT, -Dye Allergies.  Left knee pain

## 2023-02-28 NOTE — Patient Instructions (Signed)

## 2023-03-01 NOTE — Progress Notes (Signed)
Shannon Sharp - 57 y.o. female MRN [id number]  Date of birth: July 27, 1966  Office Visit Note: Visit Date: 02/28/2023 PCP: Georgann Housekeeper, MD Referred by: Sheran Luz, MD  Subjective: Chief Complaint  Patient presents with   Left Knee - Pain   HPI:  Shannon Sharp is a 57 y.o. female who comes in today at the request of Sheran Luz, MD for planned Left Genicular radiofrequency ablation and denervation with fluoroscopic guidance.  The patient has failed conservative care including injections, home exercise, medications, time and activity modification.  She has had successful genicular nerve blocks with more than 80% relief in a a double block paradigm with goal towards radiofrequency ablation of the same genicular nerves for longer term definitive care.  Please see requesting physician notes for further details and justification.  Unfortunately she was never able to get relief with diagnostic blocks on the right.  She will continue to follow-up with Dr. Sheran Luz from a chronic pain and back pain standpoint.   ROS Otherwise per HPI.  Assessment & Plan: Visit Diagnoses:    ICD-10-CM   1. Chronic pain of left knee  M25.562 XR C-ARM NO REPORT   G89.29 Radiofrequency,Lumbar    methylPREDNISolone acetate (DEPO-MEDROL) injection 80 mg      Plan: No additional findings.   Meds & Orders:  Meds ordered this encounter  Medications   methylPREDNISolone acetate (DEPO-MEDROL) injection 80 mg    Orders Placed This Encounter  Procedures   Radiofrequency,Lumbar   XR C-ARM NO REPORT    Follow-up: Return for visit to requesting provider as needed.   Procedures: No procedures performed  Genicular Nerve Neurotomy with Fluoroscopic Guidance  Patient: Shannon Sharp      Date of Birth: 04/29/1966 MRN: [id number] PCP: Georgann Housekeeper, MD      Visit Date: 02/28/2023   Universal Protocol:    Date/Time: 05/01/248:06 AM  Consent Given By: the patient  Position:  SUPINE  Additional Comments: Vital signs were monitored before and after the procedure. Patient was prepped and draped in the usual sterile fashion. The correct patient, procedure, and site was verified.  Injection Procedure Details:   Procedure diagnoses: Chronic pain of left knee [M25.562, G89.29]    Meds Administered:  Meds ordered this encounter  Medications   methylPREDNISolone acetate (DEPO-MEDROL) injection 80 mg    Laterality: Left  Location/Site:  Knee: respective superior medial, superior lateral and inferior medial genicular nerves  Needle size: 18g, 10mm active tip  Needle type: [employer]man[employer] RF Cannula  Findings:   -Comments: Biplanar orthogonal views demonstrated excellent placement of the cannulas along the genicular nerve paths.  Procedure Details: The fluoroscope was positioned in a 90 degree anteroposterior (AP) fluoroscopic view and then tilted to obtain a true AP view of the knee joint.  The target areas for the superior medial, superior lateral and inferior medial genicular nerves are the mid-point of the curve formed where the femoral shaft and epicondyles meet as well as the midpoint of the curve on the medial side where the tibial shaft meets the plateau.  Using fluoroscopic guidance the target areas were infiltrated using a 27-gauge needle and 1-2 mL of 1% lidocaine without epinephrine to provide local anesthetic.  Then, for each target area a radiofrequency cannula was driven down under intermittent fluoroscopic guidance to the periosteum of the target site.  Once all 3 needles were in place in the AP view, a lateral view was obtained and each needle was driven under  control to the midpoint of the shafts respectively.  A spot film was obtained of the lateral view and saved.  Next, an AP view was obtained once again to confirm placement.  A spot film then was obtained and saved.  After fluoroscopic imaging was used to determine and confirm  placement, motor stimulation was provided to confirm that there was no inadvertent motor stimulation.  Subsequently, each target area was infiltrated with 1-2 mL of the anesthetizing solution documented above.  After 2 minutes to allow for good anesthesia, a 90 second lesion was performed with the temperature maintained at 80C.  This was repeated for each target area.   Additional Comments:  The patient tolerated the procedure well Dressing: band-aid    Post-procedure details: Patient was observed during the procedure. Post-procedure instructions were reviewed.  Patient left the clinic in stable condition.    Clinical History: No specialty comments available.     Objective:  VS:  HT:    WT:   BMI:     BP:(!) 152/74  HR:85bpm  TEMP: ( )  RESP:  Physical Exam Vitals and nursing note reviewed.  Constitutional:      General: She is not in acute distress.    Appearance: Normal appearance. She is well-developed. She is not ill-appearing.  HENT:     Head: Normocephalic and atraumatic.  Eyes:     Conjunctiva/sclera: Conjunctivae normal.     Pupils: Pupils are equal, round, and reactive to light.  Cardiovascular:     Rate and Rhythm: Normal rate.     Pulses: Normal pulses.  Pulmonary:     Effort: Pulmonary effort is normal.  Musculoskeletal:     Right lower leg: Edema present.     Left lower leg: Edema present.     Comments: She has bilateral knee pain and crepitus with motion without evidence of effusion.  She has joint line tenderness.  She has good varus valgus stability.  Skin:    General: Skin is warm and dry.     Findings: No erythema or rash.  Neurological:     General: No focal deficit present.     Mental Status: She is alert and oriented to person, place, and time.     Sensory: No sensory deficit.     Motor: No abnormal muscle tone.     Coordination: Coordination normal.     Gait: Gait normal.  Psychiatric:        Mood and Affect: Mood normal.         Behavior: Behavior normal.      Imaging: XR C-ARM NO REPORT  Result Date: 02/28/2023 Please see Notes tab for imaging impression.

## 2023-03-01 NOTE — Procedures (Signed)
Genicular Nerve Neurotomy with Fluoroscopic Guidance  Patient: Shannon Sharp      Date of Birth: 1965/11/27 MRN: [id number] PCP: Georgann Housekeeper, MD      Visit Date: 02/28/2023   Universal Protocol:    Date/Time: 05/01/248:06 AM  Consent Given By: the patient  Position: SUPINE  Additional Comments: Vital signs were monitored before and after the procedure. Patient was prepped and draped in the usual sterile fashion. The correct patient, procedure, and site was verified.  Injection Procedure Details:   Procedure diagnoses: Chronic pain of left knee [M25.562, G89.29]    Meds Administered:  Meds ordered this encounter  Medications   methylPREDNISolone acetate (DEPO-MEDROL) injection 80 mg    Laterality: Left  Location/Site:  Knee: respective superior medial, superior lateral and inferior medial genicular nerves  Needle size: 18g, 10mm active tip  Needle type: Cosman/[employer] RF Cannula  Findings:   -Comments: Biplanar orthogonal views demonstrated excellent placement of the cannulas along the genicular nerve paths.  Procedure Details: The fluoroscope was positioned in a 90 degree anteroposterior (AP) fluoroscopic view and then tilted to obtain a true AP view of the knee joint.  The target areas for the superior medial, superior lateral and inferior medial genicular nerves are the mid-point of the curve formed where the femoral shaft and epicondyles meet as well as the midpoint of the curve on the medial side where the tibial shaft meets the plateau.  Using fluoroscopic guidance the target areas were infiltrated using a 27-gauge needle and 1-2 mL of 1% lidocaine without epinephrine to provide local anesthetic.  Then, for each target area a radiofrequency cannula was driven down under intermittent fluoroscopic guidance to the periosteum of the target site.  Once all 3 needles were in place in the AP view, a lateral view was obtained and each needle was driven under  control to the midpoint of the shafts respectively.  A spot film was obtained of the lateral view and saved.  Next, an AP view was obtained once again to confirm placement.  A spot film then was obtained and saved.  After fluoroscopic imaging was used to determine and confirm placement, motor stimulation was provided to confirm that there was no inadvertent motor stimulation.  Subsequently, each target area was infiltrated with 1-2 mL of the anesthetizing solution documented above.  After 2 minutes to allow for good anesthesia, a 90 second lesion was performed with the temperature maintained at 80C.  This was repeated for each target area.   Additional Comments:  The patient tolerated the procedure well Dressing: band-aid    Post-procedure details: Patient was observed during the procedure. Post-procedure instructions were reviewed.  Patient left the clinic in stable condition.

## 2023-04-11 ENCOUNTER — Other Ambulatory Visit: Payer: Self-pay

## 2023-04-11 ENCOUNTER — Emergency Department (HOSPITAL_COMMUNITY)
Admission: EM | Admit: 2023-04-11 | Discharge: 2023-04-11 | Disposition: A | Discharge status: Home / Self Care | Payer: 59 | Attending: Emergency Medicine | Admitting: Emergency Medicine

## 2023-04-11 ENCOUNTER — Emergency Department (HOSPITAL_COMMUNITY): Payer: 59

## 2023-04-11 ENCOUNTER — Ambulatory Visit
Admission: EM | Admit: 2023-04-11 | Discharge: 2023-04-11 | Disposition: A | Discharge status: Other Acute Inpatient Hospital | Payer: 59 | Attending: Nurse Practitioner | Admitting: Nurse Practitioner

## 2023-04-11 ENCOUNTER — Encounter: Payer: Self-pay | Admitting: Emergency Medicine

## 2023-04-11 DIAGNOSIS — Z79899 Other long term (current) drug therapy: Secondary | ICD-10-CM | POA: Diagnosis not present

## 2023-04-11 DIAGNOSIS — F309 Manic episode, unspecified: Secondary | ICD-10-CM | POA: Insufficient documentation

## 2023-04-11 DIAGNOSIS — I10 Essential (primary) hypertension: Secondary | ICD-10-CM | POA: Insufficient documentation

## 2023-04-11 DIAGNOSIS — F1290 Cannabis use, unspecified, uncomplicated: Secondary | ICD-10-CM | POA: Diagnosis not present

## 2023-04-11 DIAGNOSIS — F3181 Bipolar II disorder: Secondary | ICD-10-CM | POA: Diagnosis not present

## 2023-04-11 DIAGNOSIS — H538 Other visual disturbances: Secondary | ICD-10-CM

## 2023-04-11 DIAGNOSIS — N390 Urinary tract infection, site not specified: Secondary | ICD-10-CM | POA: Diagnosis not present

## 2023-04-11 DIAGNOSIS — Z91148 Patient's other noncompliance with medication regimen for other reason: Secondary | ICD-10-CM | POA: Insufficient documentation

## 2023-04-11 DIAGNOSIS — H579 Unspecified disorder of eye and adnexa: Secondary | ICD-10-CM | POA: Diagnosis not present

## 2023-04-11 DIAGNOSIS — Z743 Need for continuous supervision: Secondary | ICD-10-CM | POA: Diagnosis not present

## 2023-04-11 DIAGNOSIS — R6889 Other general symptoms and signs: Secondary | ICD-10-CM | POA: Diagnosis not present

## 2023-04-11 DIAGNOSIS — I159 Secondary hypertension, unspecified: Secondary | ICD-10-CM | POA: Insufficient documentation

## 2023-04-11 LAB — COMPREHENSIVE METABOLIC PANEL
ALT: 33 U/L (ref 0–44)
AST: 43 U/L — ABNORMAL HIGH (ref 15–41)
Albumin: 4.7 g/dL (ref 3.5–5.0)
Alkaline Phosphatase: 87 U/L (ref 38–126)
Anion gap: 12 (ref 5–15)
BUN: 8 mg/dL (ref 6–20)
CO2: 21 mmol/L — ABNORMAL LOW (ref 22–32)
Calcium: 10.5 mg/dL — ABNORMAL HIGH (ref 8.9–10.3)
Chloride: 106 mmol/L (ref 98–111)
Creatinine, Ser: 1.2 mg/dL — ABNORMAL HIGH (ref 0.44–1.00)
GFR, Estimated: 53 mL/min — ABNORMAL LOW (ref >60)
Glucose, Bld: 105 mg/dL — ABNORMAL HIGH (ref 70–99)
Potassium: 3.9 mmol/L (ref 3.5–5.1)
Sodium: 139 mmol/L (ref 135–145)
Total Bilirubin: 0.7 mg/dL (ref 0.3–1.2)
Total Protein: 8.3 g/dL — ABNORMAL HIGH (ref 6.5–8.1)

## 2023-04-11 LAB — URINALYSIS, ROUTINE W REFLEX MICROSCOPIC
Bilirubin Urine: NEGATIVE
Glucose, UA: NEGATIVE mg/dL
Ketones, ur: NEGATIVE mg/dL
Nitrite: NEGATIVE
Protein, ur: NEGATIVE mg/dL
Specific Gravity, Urine: 1.004 — ABNORMAL LOW (ref 1.005–1.030)
pH: 7 (ref 5.0–8.0)

## 2023-04-11 LAB — MAGNESIUM: Magnesium: 2.2 mg/dL (ref 1.7–2.4)

## 2023-04-11 LAB — CBC WITH DIFFERENTIAL/PLATELET
Abs Immature Granulocytes: 0.03 10*3/uL (ref 0.00–0.07)
Basophils Absolute: 0 10*3/uL (ref 0.0–0.1)
Basophils Relative: 0 %
Eosinophils Absolute: 0 10*3/uL (ref 0.0–0.5)
Eosinophils Relative: 0 %
HCT: 46.3 % — ABNORMAL HIGH (ref 36.0–46.0)
Hemoglobin: 14 g/dL (ref 12.0–15.0)
Immature Granulocytes: 0 %
Lymphocytes Relative: 27 %
Lymphs Abs: 2.4 10*3/uL (ref 0.7–4.0)
MCH: 23.4 pg — ABNORMAL LOW (ref 26.0–34.0)
MCHC: 30.2 g/dL (ref 30.0–36.0)
MCV: 77.4 fL — ABNORMAL LOW (ref 80.0–100.0)
Monocytes Absolute: 0.8 10*3/uL (ref 0.1–1.0)
Monocytes Relative: 9 %
Neutro Abs: 5.7 10*3/uL (ref 1.7–7.7)
Neutrophils Relative %: 64 %
Platelets: 257 10*3/uL (ref 150–400)
RBC: 5.98 MIL/uL — ABNORMAL HIGH (ref 3.87–5.11)
RDW: 15.2 % (ref 11.5–15.5)
WBC: 9 10*3/uL (ref 4.0–10.5)
nRBC: 0 % (ref 0.0–0.2)

## 2023-04-11 LAB — TSH: TSH: 0.69 u[IU]/mL (ref 0.350–4.500)

## 2023-04-11 LAB — RAPID URINE DRUG SCREEN, HOSP PERFORMED
Amphetamines: NOT DETECTED
Barbiturates: NOT DETECTED
Benzodiazepines: NOT DETECTED
Cocaine: NOT DETECTED
Opiates: NOT DETECTED
Tetrahydrocannabinol: POSITIVE — AB

## 2023-04-11 LAB — TROPONIN I (HIGH SENSITIVITY): Troponin I (High Sensitivity): 15 ng/L (ref <18)

## 2023-04-11 LAB — POCT FASTING CBG KUC MANUAL ENTRY: POCT Glucose (KUC): 124 mg/dL — AB (ref 70–99)

## 2023-04-11 LAB — T4, FREE: Free T4: 0.93 ng/dL (ref 0.61–1.12)

## 2023-04-11 MED ORDER — LORAZEPAM 1 MG PO TABS
1.0000 mg | ORAL_TABLET | ORAL | Status: AC
  Administered 2023-04-11: 1 mg via ORAL
  Filled 2023-04-11: qty 1

## 2023-04-11 MED ORDER — IRBESARTAN 300 MG PO TABS
300.0000 mg | ORAL_TABLET | Freq: Every day | ORAL | Status: DC
  Administered 2023-04-11: 300 mg via ORAL
  Filled 2023-04-11: qty 1

## 2023-04-11 MED ORDER — FOSFOMYCIN TROMETHAMINE 3 G PO PACK
3.0000 g | PACK | Freq: Once | ORAL | Status: AC
  Administered 2023-04-11: 3 g via ORAL
  Filled 2023-04-11: qty 3

## 2023-04-11 MED ORDER — HYDROCHLOROTHIAZIDE 12.5 MG PO TABS
12.5000 mg | ORAL_TABLET | Freq: Every day | ORAL | Status: DC
  Administered 2023-04-11: 12.5 mg via ORAL
  Filled 2023-04-11: qty 1

## 2023-04-11 MED ORDER — CEPHALEXIN 500 MG PO CAPS: 500.0000 mg | ORAL_CAPSULE | Freq: Four times a day (QID) | ORAL | 0 refills | Status: AC

## 2023-04-11 MED ORDER — ONDANSETRON 4 MG PO TBDP
8.0000 mg | ORAL_TABLET | Freq: Once | ORAL | Status: AC
  Administered 2023-04-11: 8 mg via ORAL
  Filled 2023-04-11: qty 2

## 2023-04-11 MED ORDER — IRBESARTAN-HYDROCHLOROTHIAZIDE 300-12.5 MG PO TABS: 1.0000 | ORAL_TABLET | Freq: Every day | ORAL | Status: DC
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
## 2023-04-11 NOTE — Discharge Instructions (Addendum)

## 2023-04-11 NOTE — ED Provider Notes (Signed)
[facility]    CSN: [id number] Arrival date & time: 04/11/23  1037      History   Chief Complaint Chief Complaint  Patient presents with   Blurred Vision    HPI Shannon Sharp is a 57 y.o. female.   History of Present Illness  Shannon Sharp is a 57 y.o. female presents for evaluation of acute visual disturbances. Onset around 8:30 this morning while she was driving.  Patient reports that she can see okay out of each eye individually but unable to see items clearly with both eyes.  As she was driving, the patient was unable to see the road signs or how close the car in front of her were.  She also reports feeling "foggy" and more confused lately. She has noted being more forgetful and misplacing things recently. She also has some neck stiffness.  No neck pain, numbness, tingling, headache, dizziness, nausea or vomiting.  Patient does not wear glasses or contacts.  She does wear readers as needed.  She has several medical problems and compliant with all prescribed medications.  She has not had any new medications or changes in medications within the past month.  She has never had anything like this happen before.  Patient has a history of hypertension, GERD, GI issues, depression, anxiety, bipolar, seizure disorder and OSA.  No recent fall or head injury.  No prior history of stroke or TIA.  No chest pain, palpitations, seizure, or shortness of breath.       Past Medical History:  Diagnosis Date   Allergy    Anxiety    Bipolar 2 disorder (HCC)    Cholelithiasis 02/2017   DDD (degenerative disc disease), cervical    Depression    Diarrhea    Fecal incontinence    GERD (gastroesophageal reflux disease)    HTN (hypertension)    Internal hemorrhoids 10/2017   noted on colonoscopy   Leg swelling    Morbid obesity (HCC)    OA (osteoarthritis)    OSA on CPAP    Peristalsis 12/2017   weak and frequent failed noted on esophageal manometry   Seizure (HCC)  02/2018   Urinary incontinence    UTI (urinary tract infection) 04/10/2018   Hospitalized    Patient Active Problem List   Diagnosis Date Noted   Attention deficit hyperactivity disorder 12/15/2021   Chronic pain syndrome 04/13/2021   Nausea with vomiting 10/28/2020   Incontinence of feces 02/12/2020   Osteoarthritis of knee 05/29/2018   CKD (chronic kidney disease), stage III (HCC) 04/11/2018   Bacteremia 04/10/2018   Acute lower UTI 04/10/2018   Heartburn    Dysphagia    Seasonal allergic rhinitis 05/08/2017   Gastroesophageal reflux disease with esophagitis 05/08/2017   Urinary frequency 03/02/2017   Acute pain of both knees 03/02/2017   Class 3 severe obesity due to excess calories without serious comorbidity with body mass index (BMI) of 45.0 to 49.9 in adult (HCC) 03/02/2017   OBESITY, NOS 12/28/2006   ANEMIA, IRON DEFICIENCY, UNSPEC. 12/28/2006   DEPRESSION, MAJOR, RECURRENT 12/28/2006   Bipolar 2 disorder (HCC) 12/28/2006   ANXIETY 12/28/2006   PANIC ATTACKS 12/28/2006   OBSESSIVE COMPUL. DISORDER 12/28/2006   ATTENTION DEFICIT, W/HYPERACTIVITY 12/28/2006   HEARING LOSS NOS OR DEAFNESS 12/28/2006   Atopic dermatitis 12/28/2006   INSOMNIA NOS 12/28/2006    Past Surgical History:  Procedure Laterality Date   24 HOUR PH STUDY N/A 01/08/2018   Procedure: 24 HOUR PH STUDY;  Surgeon: Napoleon Form, MD;  Location: [facility];  Service: Endoscopy;  Laterality: N/A;   COLONOSCOPY  11/08/2017   ESOPHAGEAL MANOMETRY N/A 01/08/2018   Procedure: ESOPHAGEAL MANOMETRY (EM);  Surgeon: Napoleon Form, MD;  Location: [facility];  Service: Endoscopy;  Laterality: N/A;   ESOPHAGOGASTRODUODENOSCOPY ENDOSCOPY  11/08/2017    OB History   No obstetric history on file.      Home Medications    Prior to Admission medications   Medication Sig Start Date End Date Taking? Authorizing Provider  busPIRone (BUSPAR) 10 MG tablet Take 10 mg by mouth 2 (two) times daily.  11/27/21   [provider]  celecoxib (CELEBREX) 200 MG capsule Take 200 mg by mouth daily as needed.    [provider]  dicyclomine (BENTYL) 10 MG capsule Take 1 capsule (10 mg total) by mouth 4 (four) times daily -  before meals and at bedtime. 05/30/22   Unk Lightning, PA  DULoxetine (CYMBALTA) 60 MG capsule Take 60 mg by mouth at bedtime. 11/27/21   [provider]  escitalopram (LEXAPRO) 20 MG tablet 1 tablet Patient not taking: Reported on 05/30/2022    [provider]  famotidine (PEPCID) 40 MG tablet Take 1 tablet (40 mg total) by mouth daily as needed for heartburn or indigestion. Patient not taking: Reported on 05/30/2022 03/13/20   Napoleon Form, MD  irbesartan (AVAPRO) 300 MG tablet 1 tablet    [provider]  irbesartan-hydrochlorothiazide (AVALIDE) 300-12.5 MG tablet 1 tablet daily.    [provider]  lamoTRIgine (LAMICTAL) 25 MG tablet 1 tablet qam and 2 qhs    [provider]  loperamide (IMODIUM) 2 MG capsule Take 1 capsule (2 mg total) by mouth as needed for diarrhea or loose stools. 03/24/22   Waldon Merl, PA-C  ondansetron (ZOFRAN ODT) 4 MG disintegrating tablet Take 1 tablet (4 mg total) by mouth daily in the afternoon. As needed 12/15/21   Napoleon Form, MD  ondansetron (ZOFRAN) 8 MG tablet Take 1 tablet (8 mg total) by mouth every 8 (eight) hours as needed for nausea or vomiting. 09/17/20   Unk Lightning, PA  ondansetron (ZOFRAN-ODT) 4 MG disintegrating tablet Take 1 tablet (4 mg total) by mouth every 8 (eight) hours as needed for nausea or vomiting. 05/30/22   Unk Lightning, PA  oxymorphone (OPANA) 10 MG tablet Take 1 tablet 3 times a day by oral route as needed for 10 days. 10/11/22   [provider]  REXULTI 3 MG TABS Take 1 tablet by mouth at bedtime. 07/23/22   [provider]  rifaximin (XIFAXAN) 550 MG TABS tablet Take 1 tablet (550 mg total) by mouth 3  (three) times daily. 06/28/22   Unk Lightning, PA  tolterodine (DETROL LA) 4 MG 24 hr capsule Take 4 mg by mouth daily. 11/30/17   [provider]  topiramate (TOPAMAX) 100 MG tablet Take 300 mg by mouth daily after supper.  11/16/17   [provider]  topiramate (TOPAMAX) 100 MG tablet 3 tablet    [provider]  TRAZODONE HCL PO Take 75 mg by mouth daily.    [provider]    Family History Family History  Problem Relation Age of Onset   Hypertension Mother    Hypertension Father    Diabetes Paternal Aunt    Diabetes Paternal Uncle    Colon cancer Neg Hx    Stomach cancer Neg Hx  Pancreatic cancer Neg Hx     Social History Social History   Tobacco Use   Smoking status: Never   Smokeless tobacco: Never  Vaping Use   Vaping Use: Never used  Substance Use Topics   Alcohol use: Not Currently   Drug use: No     Allergies   Penicillins, Penicillins, Sulfa antibiotics, and Sulfa antibiotics   Review of Systems Review of Systems  Constitutional:  Negative for activity change, appetite change, chills, fatigue and fever.  HENT: Negative.    Eyes:  Positive for visual disturbance.  Respiratory:  Negative for cough and shortness of breath.   Cardiovascular:  Negative for chest pain, palpitations and leg swelling.  Gastrointestinal:  Negative for diarrhea, nausea and vomiting.  Neurological:  Negative for dizziness, tremors, seizures, syncope, facial asymmetry, speech difficulty, weakness, light-headedness, numbness and headaches.  Psychiatric/Behavioral:  Positive for confusion.   All other systems reviewed and are negative.    Physical Exam Triage Vital Signs ED Triage Vitals  Enc Vitals Group     BP 04/11/23 1201 (!) 161/105     Pulse Rate 04/11/23 1201 (!) 104     Resp 04/11/23 1201 18     Temp 04/11/23 1201 97.8 F (36.6 C)     Temp Source 04/11/23 1201 Oral     SpO2 04/11/23 1201 94 %     Weight --      Height --       Head Circumference --      Peak Flow --      Pain Score 04/11/23 1202 0     Pain Loc --      Pain Edu? --      Excl. in GC? --    No data found.  Updated Vital Signs BP (!) 161/105 (BP Location: Right Arm)   Pulse (!) 104   Temp 97.8 F (36.6 C) (Oral)   Resp 18   SpO2 94%   Visual Acuity Right Eye Distance: 20/40 Left Eye Distance: 20/50 Bilateral Distance: 20/40  Right Eye Near:   Left Eye Near:    Bilateral Near:     Physical Exam Vitals reviewed.  Constitutional:      General: She is not in acute distress.    Appearance: Normal appearance. She is not ill-appearing, toxic-appearing or diaphoretic.  HENT:     Head: Normocephalic.     Nose: Nose normal.     Mouth/Throat:     Mouth: Mucous membranes are moist.  Eyes:     General: Lids are normal. No visual field deficit.    Extraocular Movements: Extraocular movements intact.     Right eye: No nystagmus.     Left eye: No nystagmus.     Conjunctiva/sclera: Conjunctivae normal.     Visual Fields: Right eye visual fields normal and left eye visual fields normal.  Cardiovascular:     Rate and Rhythm: Normal rate.  Pulmonary:     Effort: Pulmonary effort is normal.     Breath sounds: Normal breath sounds.  Musculoskeletal:        General: Normal range of motion.     Cervical back: Normal range of motion and neck supple. No rigidity.  Lymphadenopathy:     Cervical: No cervical adenopathy.  Skin:    General: Skin is warm and dry.  Neurological:     Mental Status: She is alert and oriented to person, place, and time.     GCS: GCS eye subscore is 4. GCS verbal  subscore is 5. GCS motor subscore is 6.     Cranial Nerves: Cranial nerves 2-12 are intact. No cranial nerve deficit.     Sensory: Sensation is intact. No sensory deficit.     Motor: Motor function is intact. No weakness.     Coordination: Coordination is intact.     Gait: Gait is intact.  Psychiatric:        Attention and Perception: Attention  normal.        Mood and Affect: Mood and affect normal.        Speech: Speech normal.        Behavior: Behavior normal. Behavior is cooperative.        Thought Content: Thought content normal.        Cognition and Memory: Cognition normal.        Judgment: Judgment normal.      UC Treatments / Results  Labs (all labs ordered are listed, but only abnormal results are displayed) Labs Reviewed  POCT FASTING CBG [facility] MANUAL ENTRY - Abnormal; Notable for the following components:      Result Value   POCT Glucose ([facility]) 124 (*)    All other components within normal limits    EKG   Radiology No results found.  Procedures Procedures (including critical care time)  Medications Ordered in UC Medications - No data to display  Initial Impression / Assessment and Plan / UC Course  I have reviewed the triage vital signs and the nursing notes.  Pertinent labs & imaging results that were available during my care of the patient were reviewed by me and considered in my medical decision making (see chart for details).    57 year old female with history of hypertension, GERD, GI issues, depression, anxiety, bipolar disorder, seizure disorder and OSA presents with an acute onset of visual disturbances that started around 830 this morning while she was driving.  No neck pain, numbness, tingling, headache, dizziness, nausea, vomiting, chest pain, palpitations, seizures or shortness of breath.  She has never had anything like this happen before.  No recent fall or head injury.  No prior history of stroke or TIA.  She is alert and oriented.  Mildly hypertensive at 161/105.  All other vitals unremarkable.  Physical exam as above.  Will acuity unremarkable.  CBG 124.  Given acuity of symptoms and history, patient referred emergently to the ED via EMS as a CODE STROKE for emergent Head CT and further evaluation.  Patient agreeable with provider recommendations.  EMS notified for patient transfer to the  hospital.  Patient left clinic with all personal belongings with EMS in stable condition. Final Clinical Impressions(s) / UC Diagnoses   Final diagnoses:  Blurred vision     Discharge Instructions      A visual disturbance is any problem that interferes with your normal vision. This can affect one eye or both eyes. Some types of visual disturbances come and go without treatment and do not cause a permanent problem. Other visual disturbances may be a sign of an eye emergency or a medical emergency.  I recommended that you go to the ER today via EMS to get a head CT to make sure that the cause of your blurred vision is not a medical emergency (i.e. stroke) or an eye emergency.     ED Prescriptions   None    PDMP not reviewed this encounter.   Lurline Idol, [place] 04/11/23 1240

## 2023-04-11 NOTE — Consult Note (Signed)
BH ED ASSESSMENT   Reason for Consult:  Mania Referring Physician:  Karene Fry Patient Identification: Shannon Sharp MRN:  [id number] ED Chief Complaint: Bipolar 2 disorder [facility])  Diagnosis:  Principal Problem:   Bipolar 2 disorder [facility])   ED Assessment Time Calculation: Start Time: 1730 Stop Time: 1830 Total Time in Minutes (Assessment Completion): 60   HPI:   Shannon Sharp is a 57 y.o. female patient with medical history significant for bipolar disorder, morbid obesity, depression, anxiety, hypertension, GERD who presents to the emergency department with concern for blurry vision.  Shannon Sharp had sudden onset blurred vision at 830 today while driving.  Shannon Sharp has not slept in the past 3 days.  Shannon Sharp thinks Shannon Sharp is hypomanic or developing mania.  Shannon Sharp has had some rapid pressured speech.  Shannon Sharp has been noncompliant with Shannon Sharp blood pressure medications for the past month.  Shannon Sharp states that Shannon Sharp has not had much to eat in the past 5 days.  Shannon Sharp was seen in urgent care initially and due to concern for stroke Shannon Sharp was sent to the emergency department.  Shannon Sharp denies any difficulty speaking, slurred speech, difficulty swallowing.  Shannon Sharp denies any facial droop, numbness or weakness.  Shannon Sharp denies any double vision.  Shannon Sharp denies any headaches, fevers or chills.  Shannon Sharp endorses mild blurry vision.  No chest pain or shortness of breath.  No abdominal pain, nausea or vomiting.   On arrival, the patient was vitally stable, afebrile, not tachycardic or tachypneic, saturating on room air, mildly hypertensive BP 161/105.  Patient neurologic exam without focal deficits.  Low concern for CVA at this time with no diplopia, no vision loss, no focal deficits noted.  Considered blurred vision in the setting of the patient's lack of sleep for the past 3 days in addition to mild hypertension.   Workup initiated to include EKG which revealed sinus rhythm, ventricular rate 98, nonspecific T wave changes, no STEMI.  A chest x-ray performed  revealed no evidence of pulmonary edema, no other acute component of pulmonary disease.  CT head was performed revealed no acute cardiopulmonary process.   Lab evaluation significant for UDS only positive for THC, troponin 15, CMP generally unremarkable with a serum creatinine of 1.2 which is at the patient's baseline, no significant electrolyte abnormality other than mild hypercalcemia to 10.5.  The patient was found to have a urinalysis with large leukocytes, negative nitrites, 11-20 WBCs, rare bacteria.  Shannon Sharp was administered fosfomycin for this.  CBC without a leukocytosis or anemia.   The patient's lack of sleep, mildly pressured speech, decreased food intake, concern for hypomania or mania.  The patient is medically cleared at this time for TTS consultation.  Subjective:   Patient seen at [facility] for face to face psychiatric evaluation. Pt is pleasant, calm, and cooperative during assessment. Shannon Sharp speech is normal in rate and tone. Thought content intact. Pt is logical and goal directed. Appears to have good insight and judgement.   Pt states Shannon Sharp has been compliant with Shannon Sharp psychiatric medications, and Shannon Sharp is closely followed up by Shannon Sharp PCP and Psychiatrist. Shannon Sharp does report being noncompliant with blood pressure medication which brought Shannon Sharp to the hospital today. Pt states every few months Shannon Sharp experiences these hypomanic episodes where Shannon Sharp sleep and appetite are disturbed, but usually after a few days Shannon Sharp gets more tired and starts sleeping/returns to baseline again. Pt states "I don't know why it happens. I always take my medicines but I'll just randomly not sleep well for a  few days and then it goes away and I'm super tired and I'm just back to normal again." Pt states "I am fine. I am finally feeling really sleepy and just want to go home to my own bed." Pt states it has been around 6 months since Shannon Sharp has felt this way. Shannon Sharp reports these hypomanic feelings to Shannon Sharp psychiatrist each time, Shannon Sharp plans on  calling Shannon Sharp psychiatrist tomorrow.   Pt denies any suicidal ideations. Denies history of suicide attempts. Shannon Sharp denies HI. Denies auditory or visual hallucinations. Does not appear to have any grandiose or delusional thinking. Shannon Sharp does report poor sleep over the past 3-4 days and decreased appetite. Shannon Sharp denies any current stress or triggers. 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 Shannon Sharp adult Sharp who is 75 lives with Shannon Sharp. Pt does endorse occasional marijuana use, encouraged Shannon Sharp to discontinue this.   Ultimately, patient is wanting to return home. Shannon Sharp mentions many times this happens to Shannon Sharp a few times a year, and Shannon Sharp manages well at home and with Shannon Sharp OP provider. Pt states "I have never been to a psychiatric hospital and I don't plan on going today." I offered inpatient treatment to patient, to stabilize hypomanic symptoms and evaluate current medications, but Shannon Sharp declines. Shannon Sharp mother is also in the room, and reports having no safety concerns at this time. Patient does not meet criteria for [place] IVC, at this time will psychiatrically clear patient.   Past Psychiatric History:  Bipolar disorder, substance abuse  Risk to Self or Others: Is the patient at risk to self? No Has the patient been a risk to self in the past 6 months? No Has the patient been a risk to self within the distant past? No Is the patient a risk to others? No Has the patient been a risk to others in the past 6 months? No Has the patient been a risk to others within the distant past? No  [place] Scale:  Flowsheet Row ED from 04/11/2023 in [facility] Emergency Department at [facility] Most recent reading at 04/11/2023  1:34 PM ED from 04/11/2023 in [facility] at [facility]) Most recent reading at 04/11/2023 12:02 PM  C-SSRS RISK CATEGORY No Risk No Risk       Past Medical History:  Past Medical History:  Diagnosis Date   Allergy    Anxiety    Bipolar 2 disorder (HCC)    Cholelithiasis  02/2017   DDD (degenerative disc disease), cervical    Depression    Diarrhea    Fecal incontinence    GERD (gastroesophageal reflux disease)    HTN (hypertension)    Internal hemorrhoids 10/2017   noted on colonoscopy   Leg swelling    Morbid obesity (HCC)    OA (osteoarthritis)    OSA on CPAP    Peristalsis 12/2017   weak and frequent failed noted on esophageal manometry   Seizure (HCC) 02/2018   Urinary incontinence    UTI (urinary tract infection) 04/10/2018   Hospitalized    Past Surgical History:  Procedure Laterality Date   24 HOUR PH STUDY N/A 01/08/2018   Procedure: 24 HOUR PH STUDY;  Surgeon: Napoleon Form, MD;  Location: [facility] ENDOSCOPY;  Service: Endoscopy;  Laterality: N/A;   COLONOSCOPY  11/08/2017   ESOPHAGEAL MANOMETRY N/A 01/08/2018   Procedure: ESOPHAGEAL MANOMETRY (EM);  Surgeon: Napoleon Form, MD;  Location: [facility];  Service: Endoscopy;  Laterality: N/A;   ESOPHAGOGASTRODUODENOSCOPY ENDOSCOPY  11/08/2017   Family History:  Family History  Problem Relation Age of Onset   Hypertension Mother    Hypertension Father    Diabetes Paternal Aunt    Diabetes Paternal Uncle    Colon cancer Neg Hx    Stomach cancer Neg Hx    Pancreatic cancer Neg Hx    Social History:  Social History   Substance and Sexual Activity  Alcohol Use Not Currently     Social History   Substance and Sexual Activity  Drug Use No    Social History   Socioeconomic History   Marital status: Single    Spouse name: Not on file   Number of children: 2   Years of education: 12   Highest education level: Not on file  Occupational History   Occupation: Disability  Tobacco Use   Smoking status: Never   Smokeless tobacco: Never  Vaping Use   Vaping Use: Never used  Substance and Sexual Activity   Alcohol use: Not Currently   Drug use: No   Sexual activity: Not Currently    Birth control/protection: I.U.D.  Other Topics Concern   Not on file  Social History  Narrative   Fun/Hobby: Attend classes at the [employer]; working on going to the gym.    Social Determinants of Health   Financial Resource Strain: Not on file  Food Insecurity: Not on file  Transportation Needs: Not on file  Physical Activity: Not on file  Stress: Not on file  Social Connections: Not on file    Allergies:   Allergies  Allergen Reactions   Penicillins Itching and Other (See Comments)    Has patient had a PCN reaction causing immediate rash, facial/tongue/throat swelling, SOB or lightheadedness with hypotension: no Has patient had a PCN reaction causing severe rash involving mucus membranes or skin necrosis: no Has patient had a PCN reaction that required hospitalization: no Has patient had a PCN reaction occurring within the last 10 years: no If all of the above answers are "NO", then may proceed with Cephalosporin use.    Penicillins Itching   Sulfa Antibiotics Swelling    Other reaction(s): tongue swelling   Sulfa Antibiotics     Other reaction(s): tongue swelling    Labs:  Results for orders placed or performed during the hospital encounter of 04/11/23 (from the past 48 hour(s))  CBC with Differential     Status: Abnormal   Collection Time: 04/11/23  1:47 PM  Result Value Ref Range   WBC 9.0 4.0 - 10.5 K/uL   RBC 5.98 (H) 3.87 - 5.11 MIL/uL   Hemoglobin 14.0 12.0 - 15.0 g/dL   HCT 16.1 (H) 09.6 - 04.5 %   MCV 77.4 (L) 80.0 - 100.0 fL   MCH 23.4 (L) 26.0 - 34.0 pg   MCHC 30.2 30.0 - 36.0 g/dL   RDW 40.9 81.1 - 91.4 %   Platelets 257 150 - 400 K/uL   nRBC 0.0 0.0 - 0.2 %   Neutrophils Relative % 64 %   Neutro Abs 5.7 1.7 - 7.7 K/uL   Lymphocytes Relative 27 %   Lymphs Abs 2.4 0.7 - 4.0 K/uL   Monocytes Relative 9 %   Monocytes Absolute 0.8 0.1 - 1.0 K/uL   Eosinophils Relative 0 %   Eosinophils Absolute 0.0 0.0 - 0.5 K/uL   Basophils Relative 0 %   Basophils Absolute 0.0 0.0 - 0.1 K/uL   Immature Granulocytes 0 %   Abs Immature  Granulocytes 0.03 0.00 - 0.07 K/uL    Comment: Performed at [facility], [address] [place], [place] [address]  Comprehensive metabolic panel     Status: Abnormal   Collection Time: 04/11/23  1:47 PM  Result Value Ref Range   Sodium 139 135 - 145 mmol/L   Potassium 3.9 3.5 - 5.1 mmol/L   Chloride 106 98 - 111 mmol/L   CO2 21 (L) 22 - 32 mmol/L   Glucose, Bld 105 (H) 70 - 99 mg/dL    Comment: Glucose reference range applies only to samples taken after fasting for at least 8 hours.   BUN 8 6 - 20 mg/dL   Creatinine, Ser 9.14 (H) 0.44 - 1.00 mg/dL   Calcium 78.2 (H) 8.9 - 10.3 mg/dL   Total Protein 8.3 (H) 6.5 - 8.1 g/dL   Albumin 4.7 3.5 - 5.0 g/dL   AST 43 (H) 15 - 41 U/L   ALT 33 0 - 44 U/L   Alkaline Phosphatase 87 38 - 126 U/L   Total Bilirubin 0.7 0.3 - 1.2 mg/dL   GFR, Estimated 53 (L) >60 mL/min    Comment: (NOTE) Calculated using the CKD-EPI Creatinine Equation (2021)    Anion gap 12 5 - 15    Comment: Performed at [facility], [address], [place], [place] [address]  Troponin I (High Sensitivity)     Status: None   Collection Time: 04/11/23  1:47 PM  Result Value Ref Range   Troponin I (High Sensitivity) 15 <18 ng/L    Comment: (NOTE) Elevated high sensitivity troponin I (hsTnI) values and significant  changes across serial measurements may suggest ACS but many other  chronic and acute conditions are known to elevate hsTnI results.  Refer to the "Links" section for chest pain algorithms and additional  guidance. Performed at [facility], [address], [place], [place] [address]   Urinalysis, Routine w reflex microscopic -Urine, Clean Catch     Status: Abnormal   Collection Time: 04/11/23  2:21 PM  Result Value Ref Range   Color, Urine STRAW (A) YELLOW   APPearance CLEAR CLEAR   Specific Gravity, Urine 1.004 (L) 1.005 - 1.030   pH 7.0 5.0 - 8.0   Glucose, UA NEGATIVE NEGATIVE mg/dL   Hgb urine dipstick SMALL (A) NEGATIVE   Bilirubin  Urine NEGATIVE NEGATIVE   Ketones, ur NEGATIVE NEGATIVE mg/dL   Protein, ur NEGATIVE NEGATIVE mg/dL   Nitrite NEGATIVE NEGATIVE   Leukocytes,Ua LARGE (A) NEGATIVE   RBC / HPF 0-5 0 - 5 RBC/hpf   WBC, UA 11-20 0 - 5 WBC/hpf   Bacteria, UA RARE (A) NONE SEEN   Squamous Epithelial / HPF 0-5 0 - 5 /HPF    Comment: Performed at [facility], [address], [place], [place] [address]  Rapid urine drug screen (hospital performed)     Status: Abnormal   Collection Time: 04/11/23  2:21 PM  Result Value Ref Range   Opiates NONE DETECTED NONE DETECTED   Cocaine NONE DETECTED NONE DETECTED   Benzodiazepines NONE DETECTED NONE DETECTED   Amphetamines NONE DETECTED NONE DETECTED   Tetrahydrocannabinol POSITIVE (A) NONE DETECTED   Barbiturates NONE DETECTED NONE DETECTED    Comment: (NOTE) DRUG SCREEN FOR MEDICAL PURPOSES ONLY.  IF CONFIRMATION IS NEEDED FOR ANY PURPOSE, NOTIFY LAB WITHIN 5 DAYS.  LOWEST DETECTABLE LIMITS FOR URINE DRUG SCREEN Drug Class  Cutoff (ng/mL) Amphetamine and metabolites    1000 Barbiturate and metabolites    200 Benzodiazepine                 200 Opiates and metabolites        300 Cocaine and metabolites        300 THC                            50 Performed at [facility], [address], [place], [place] [address]   TSH     Status: None   Collection Time: 04/11/23  3:00 PM  Result Value Ref Range   TSH 0.690 0.350 - 4.500 uIU/mL    Comment: Performed by a 3rd Generation assay with a functional sensitivity of <=0.01 uIU/mL. Performed at [facility], [address] [place], [place] [address]     Current Facility-Administered Medications  Medication Dose Route Frequency Provider Last Rate Last Admin   irbesartan (AVAPRO) tablet 300 mg  300 mg Oral Daily Arabella Merles, RPH   300 mg at 04/11/23 1657   And   hydrochlorothiazide (HYDRODIURIL) tablet 12.5 mg  12.5 mg Oral Daily Arabella Merles, RPH   12.5 mg at 04/11/23  1657   methylPREDNISolone acetate (DEPO-MEDROL) injection 80 mg  80 mg Other Once Tyrell Antonio, MD       Current Outpatient Medications  Medication Sig Dispense Refill   busPIRone (BUSPAR) 10 MG tablet Take 10 mg by mouth 2 (two) times daily.     celecoxib (CELEBREX) 200 MG capsule Take 200 mg by mouth daily as needed.     dicyclomine (BENTYL) 10 MG capsule Take 1 capsule (10 mg total) by mouth 4 (four) times daily -  before meals and at bedtime. 90 capsule 2   DULoxetine (CYMBALTA) 60 MG capsule Take 60 mg by mouth at bedtime.     escitalopram (LEXAPRO) 20 MG tablet 1 tablet (Patient not taking: Reported on 05/30/2022)     famotidine (PEPCID) 40 MG tablet Take 1 tablet (40 mg total) by mouth daily as needed for heartburn or indigestion. (Patient not taking: Reported on 05/30/2022) 30 tablet 11   irbesartan (AVAPRO) 300 MG tablet 1 tablet     irbesartan-hydrochlorothiazide (AVALIDE) 300-12.5 MG tablet 1 tablet daily.     lamoTRIgine (LAMICTAL) 25 MG tablet 1 tablet qam and 2 qhs     loperamide (IMODIUM) 2 MG capsule Take 1 capsule (2 mg total) by mouth as needed for diarrhea or loose stools. 30 capsule 0   ondansetron (ZOFRAN ODT) 4 MG disintegrating tablet Take 1 tablet (4 mg total) by mouth daily in the afternoon. As needed 30 tablet 1   ondansetron (ZOFRAN) 8 MG tablet Take 1 tablet (8 mg total) by mouth every 8 (eight) hours as needed for nausea or vomiting. 30 tablet 5   ondansetron (ZOFRAN-ODT) 4 MG disintegrating tablet Take 1 tablet (4 mg total) by mouth every 8 (eight) hours as needed for nausea or vomiting. 30 tablet 3   oxymorphone (OPANA) 10 MG tablet Take 1 tablet 3 times a day by oral route as needed for 10 days.     REXULTI 3 MG TABS Take 1 tablet by mouth at bedtime.     rifaximin (XIFAXAN) 550 MG TABS tablet Take 1 tablet (550 mg total) by mouth 3 (three) times daily. 42 tablet 0   tolterodine (DETROL LA) 4 MG 24 hr capsule Take  4 mg by mouth daily.     topiramate (TOPAMAX)  100 MG tablet Take 300 mg by mouth daily after supper.      topiramate (TOPAMAX) 100 MG tablet 3 tablet     TRAZODONE HCL PO Take 75 mg by mouth daily.      Psychiatric Specialty Exam: Presentation  General Appearance:  Appropriate for Environment  Eye Contact: Good  Speech: Clear and Coherent  Speech Volume: Normal  Handedness:No data recorded  Mood and Affect  Mood: Euthymic  Affect: Appropriate   Thought Process  Thought Processes: Goal Directed; Coherent  Descriptions of Associations:Intact  Orientation:Full (Time, Place and Person)  Thought Content:Logical; WDL  History of Schizophrenia/Schizoaffective disorder:No data recorded Duration of Psychotic Symptoms:No data recorded Hallucinations:Hallucinations: None  Ideas of Reference:None  Suicidal Thoughts:Suicidal Thoughts: No  Homicidal Thoughts:Homicidal Thoughts: No   Sensorium  Memory: Immediate Good; Recent Good  Judgment: Fair  Insight: Fair   Art therapist  Concentration: Good  Attention Span: Good  Recall: Good  Fund of Knowledge: Good  Language: Good   Psychomotor Activity  Psychomotor Activity: Psychomotor Activity: Normal   Assets  Assets: Desire for Improvement; Physical Health; Resilience; Social Support; Housing    Sleep  Sleep: Sleep: Poor   Physical Exam: Physical Exam Neurological:     Mental Status: Shannon Sharp is alert and oriented to person, place, and time.  Psychiatric:        Attention and Perception: Attention normal.        Mood and Affect: Mood normal.        Speech: Speech normal.        Behavior: Behavior is cooperative.        Thought Content: Thought content normal.    Review of Systems  Psychiatric/Behavioral:         Hx of bipolar disorder with mania and depression  All other systems reviewed and are negative.  Blood pressure (!) 197/92, pulse 78, temperature 98.6 F (37 C), temperature source Oral, resp. rate (!) 21, SpO2  100 %. There is no height or weight on file to calculate BMI.  Medical Decision Making: Pt case reviewed and discussed with Dr. Lucianne Muss. Patient was offered IP treatment for further monitoring and to stabilize sleep, however Shannon Sharp declined. Ultimately, Shannon Sharp does not meet [place] IVC criteria. Shannon Sharp is closely followed up by OP, Shannon Sharp family has no safety concerns about Shannon Sharp going home tonight, and pt plans on calling Shannon Sharp psychiatrist tomorrow. Pt just got Shannon Sharp prescriptions filled last week, denies needing any refills at this time.   Did go over return precautions with the patient. If Shannon Sharp does not sleep well again tonight, pt agreed to represent to ED or the [facility] tomorrow. Will give patient one time dose of Ativan 1 mg PO to assist with sleep.   Resources provided in AVS  Disposition: No evidence of imminent risk to self or others at present.   Patient does not meet criteria for psychiatric inpatient admission. Supportive therapy provided about ongoing stressors. Discussed crisis plan, support from social network, calling 911, coming to the Emergency Department, and calling Suicide Hotline.  Eligha Bridegroom, NP 04/11/2023 6:02 PM

## 2023-04-11 NOTE — ED Triage Notes (Addendum)
Pt here for blurry vision starting while driving 2 hours ago; pt sts vision is clear when she closes one eye but together is very blurry; no other sx noted; NP notified

## 2023-04-11 NOTE — ED Provider Notes (Signed)
Patient seen by Dr. Florentina Addison as well as by behavior health.  Has been cleared from a psychiatric standpoint.  Will place patient antibiotics for her UTI   Lorre Nick, MD 04/11/23 2002

## 2023-04-11 NOTE — ED Notes (Signed)
Patient is being discharged from the Urgent Care and sent to the Emergency Department via EMS . Per SM, patient is in need of higher level of care due to vision change. Patient is aware and verbalizes understanding of plan of care.  Vitals:   04/11/23 1201  BP: (!) 161/105  Pulse: (!) 104  Resp: 18  Temp: 97.8 F (36.6 C)  SpO2: 94%

## 2023-04-11 NOTE — ED Provider Notes (Signed)
[facility] AT [facility] Provider Note   CSN: [id number] Arrival date & time: 04/11/23  1324     History  Chief Complaint  Patient presents with   Blurred Vision    Shannon Sharp is a 57 y.o. female.  HPI   57 year old female with medical history significant for bipolar disorder, morbid obesity, depression, anxiety, hypertension, GERD who presents to the emergency department with concern for blurry vision.  She had sudden onset blurred vision at 830 today while driving.  She has not slept in the past 3 days.  She thinks she is hypomanic or developing mania.  She has had some rapid pressured speech.  She has been noncompliant with her blood pressure medications for the past month.  She states that she has not had much to eat in the past 5 days.  She was seen in urgent care initially and due to concern for stroke she was sent to the emergency department.  She denies any difficulty speaking, slurred speech, difficulty swallowing.  She denies any facial droop, numbness or weakness.  She denies any double vision.  She denies any headaches, fevers or chills.  She endorses mild blurry vision.  No chest pain or shortness of breath.  No abdominal pain, nausea or vomiting.  Home Medications Prior to Admission medications   Medication Sig Start Date End Date Taking? Authorizing Provider  busPIRone (BUSPAR) 10 MG tablet Take 10 mg by mouth 2 (two) times daily. 11/27/21   [provider]  celecoxib (CELEBREX) 200 MG capsule Take 200 mg by mouth daily as needed.    [provider]  dicyclomine (BENTYL) 10 MG capsule Take 1 capsule (10 mg total) by mouth 4 (four) times daily -  before meals and at bedtime. 05/30/22   Unk Lightning, PA  DULoxetine (CYMBALTA) 60 MG capsule Take 60 mg by mouth at bedtime. 11/27/21   [provider]  escitalopram (LEXAPRO) 20 MG tablet 1 tablet Patient not taking: Reported on 05/30/2022    [provider]  famotidine (PEPCID) 40 MG tablet Take 1 tablet (40 mg total) by mouth daily as needed for heartburn or indigestion. Patient not taking: Reported on 05/30/2022 03/13/20   Napoleon Form, MD  irbesartan (AVAPRO) 300 MG tablet 1 tablet    [provider]  irbesartan-hydrochlorothiazide (AVALIDE) 300-12.5 MG tablet 1 tablet daily.    [provider]  lamoTRIgine (LAMICTAL) 25 MG tablet 1 tablet qam and 2 qhs    [provider]  loperamide (IMODIUM) 2 MG capsule Take 1 capsule (2 mg total) by mouth as needed for diarrhea or loose stools. 03/24/22   Waldon Merl, PA-C  ondansetron (ZOFRAN ODT) 4 MG disintegrating tablet Take 1 tablet (4 mg total) by mouth daily in the afternoon. As needed 12/15/21   Napoleon Form, MD  ondansetron (ZOFRAN) 8 MG tablet Take 1 tablet (8 mg total) by mouth every 8 (eight) hours as needed for nausea or vomiting. 09/17/20   Unk Lightning, PA  ondansetron (ZOFRAN-ODT) 4 MG disintegrating tablet Take 1 tablet (4 mg total) by mouth every 8 (eight) hours as needed for nausea or vomiting. 05/30/22   Unk Lightning, PA  oxymorphone (OPANA) 10 MG tablet Take 1 tablet 3 times a day by oral route as needed for 10 days. 10/11/22   [provider]  REXULTI 3 MG TABS Take 1 tablet by mouth at bedtime. 07/23/22   [provider]  rifaximin (XIFAXAN) 550 MG TABS tablet Take 1 tablet (550 mg total) by mouth 3 (three) times daily. 06/28/22   Unk Lightning, PA  tolterodine (DETROL LA) 4 MG 24 hr capsule Take 4 mg by mouth daily. 11/30/17   [provider]  topiramate (TOPAMAX) 100 MG tablet Take 300 mg by mouth daily after supper.  11/16/17   [provider]  topiramate (TOPAMAX) 100 MG tablet 3 tablet    [provider]  TRAZODONE HCL PO Take 75 mg by mouth daily.    [provider]      Allergies    Penicillins, Penicillins, Sulfa antibiotics, and Sulfa  antibiotics    Review of Systems   Review of Systems  All other systems reviewed and are negative.   Physical Exam Updated Vital Signs BP (!) 181/85   Pulse 86   Temp 98.2 F (36.8 C) (Oral)   Resp (!) 24   SpO2 100%  Physical Exam Vitals and nursing note reviewed.  Constitutional:      General: She is not in acute distress.    Appearance: She is well-developed.  HENT:     Head: Normocephalic and atraumatic.  Eyes:     Conjunctiva/sclera: Conjunctivae normal.  Cardiovascular:     Rate and Rhythm: Normal rate and regular rhythm.     Heart sounds: No murmur heard. Pulmonary:     Effort: Pulmonary effort is normal. No respiratory distress.     Breath sounds: Normal breath sounds.  Abdominal:     Palpations: Abdomen is soft.     Tenderness: There is no abdominal tenderness.  Musculoskeletal:        General: No swelling.     Cervical back: Neck supple.  Skin:    General: Skin is warm and dry.     Capillary Refill: Capillary refill takes less than 2 seconds.  Neurological:     Mental Status: She is alert.     Comments: MENTAL STATUS EXAM:    Orientation: Alert and oriented to person, place and time.  Memory: Cooperative, follows commands well.  Language: Speech is clear and language is normal.   CRANIAL NERVES:    CN 2 (Optic): Visual fields intact to confrontation.  CN 3,4,6 (EOM): Pupils equal and reactive to light. Full extraocular eye movement without nystagmus.  CN 5 (Trigeminal): Facial sensation is normal, no weakness of masticatory muscles.  CN 7 (Facial): No facial weakness or asymmetry.  CN 8 (Auditory): Auditory acuity grossly normal.  CN 9,10 (Glossophar): The uvula is midline, the palate elevates symmetrically.  CN 11 (spinal access): Normal sternocleidomastoid and trapezius strength.  CN 12 (Hypoglossal): The tongue is midline. No atrophy or fasciculations.Marland Kitchen   MOTOR:  Muscle Strength: 5/5RUE, 5/5LUE, 5/5RLE, 5/5LLE.   COORDINATION:   Intact  finger-to-nose, no tremor.   SENSATION:   Intact to light touch all four extremities.     Psychiatric:        Attention and Perception: Attention and perception normal.        Mood and Affect: Mood is anxious and elated.        Speech: Speech is rapid and pressured.        Thought Content: Thought content normal.     ED Results / Procedures / Treatments   Labs (all labs ordered are listed, but only abnormal results are displayed) Labs Reviewed  CBC WITH DIFFERENTIAL/PLATELET - Abnormal; Notable for the following components:      Result Value   RBC  5.98 (*)    HCT 46.3 (*)    MCV 77.4 (*)    MCH 23.4 (*)    All other components within normal limits  COMPREHENSIVE METABOLIC PANEL - Abnormal; Notable for the following components:   CO2 21 (*)    Glucose, Bld 105 (*)    Creatinine, Ser 1.20 (*)    Calcium 10.5 (*)    Total Protein 8.3 (*)    AST 43 (*)    GFR, Estimated 53 (*)    All other components within normal limits  URINALYSIS, ROUTINE W REFLEX MICROSCOPIC - Abnormal; Notable for the following components:   Color, Urine STRAW (*)    Specific Gravity, Urine 1.004 (*)    Hgb urine dipstick SMALL (*)    Leukocytes,Ua LARGE (*)    Bacteria, UA RARE (*)    All other components within normal limits  RAPID URINE DRUG SCREEN, HOSP PERFORMED - Abnormal; Notable for the following components:   Tetrahydrocannabinol POSITIVE (*)    All other components within normal limits  TSH  T4, FREE  MAGNESIUM  TROPONIN I (HIGH SENSITIVITY)    EKG EKG Interpretation  Date/Time:  Tuesday April 11 2023 13:36:38 EDT Ventricular Rate:  98 PR Interval:  145 QRS Duration: 123 QT Interval:  358 QTC Calculation: 458 R Axis:   47 Text Interpretation: Sinus rhythm Biatrial enlargement Nonspecific intraventricular conduction delay Nonspecific T abnrm, anterolateral leads Confirmed by Ernie Avena ([id number]) on 04/11/2023 2:27:51 PM  Radiology DG Chest Portable 1 View  Result Date:  04/11/2023 CLINICAL DATA:  Hypertension EXAM: PORTABLE CHEST 1 VIEW COMPARISON:  X-ray 08/13/2020 FINDINGS: No consolidation, pneumothorax or effusion. No edema. Normal cardiopericardial silhouette. Degenerative changes are seen along the spine. IMPRESSION: No acute cardiopulmonary disease. Electronically Signed   By: Karen Kays M.D.   On: 04/11/2023 16:16   CT HEAD WO CONTRAST ( )  Result Date: 04/11/2023 CLINICAL DATA:  Blurred vision, stroke suspected EXAM: CT HEAD WITHOUT CONTRAST TECHNIQUE: Contiguous axial images were obtained from the base of the skull through the vertex without intravenous contrast. RADIATION DOSE REDUCTION: This exam was performed according to the departmental dose-optimization program which includes automated exposure control, adjustment of the mA and/or kV according to patient size and/or use of iterative reconstruction technique. COMPARISON:  02/21/2005 FINDINGS: Brain: No evidence of acute infarction, hemorrhage, mass, mass effect, or midline shift. No hydrocephalus or extra-axial fluid collection. Vascular: No hyperdense vessel. Skull: Negative for fracture or focal lesion. Sinuses/Orbits: No acute finding. Other: The mastoid air cells are well aerated. IMPRESSION: No acute intracranial process. Electronically Signed   By: Wiliam Ke M.D.   On: 04/11/2023 16:15    Procedures Procedures    Medications Ordered in ED Medications  fosfomycin (MONUROL) packet 3 g (has no administration in time range)    ED Course/ Medical Decision Making/ A&P                             Medical Decision Making Amount and/or Complexity of Data Reviewed Labs: ordered. Radiology: ordered.  Risk Prescription drug management.    57 year old female with medical history significant for bipolar disorder, morbid obesity, depression, anxiety, hypertension, GERD who presents to the emergency department with concern for blurry vision.  She had sudden onset blurred vision at 830 today  while driving.  She has not slept in the past 3 days.  She thinks she is hypomanic or developing mania.  She has had  some rapid pressured speech.  She has been noncompliant with her blood pressure medications for the past month.  She states that she has not had much to eat in the past 5 days.  She was seen in urgent care initially and due to concern for stroke she was sent to the emergency department.  She denies any difficulty speaking, slurred speech, difficulty swallowing.  She denies any facial droop, numbness or weakness.  She denies any double vision.  She denies any headaches, fevers or chills.  She endorses mild blurry vision.  No chest pain or shortness of breath.  No abdominal pain, nausea or vomiting.  On arrival, the patient was vitally stable, afebrile, not tachycardic or tachypneic, saturating on room air, mildly hypertensive BP 161/105.  Patient neurologic exam without focal deficits.  Low concern for CVA at this time with no diplopia, no vision loss, no focal deficits noted.  Considered blurred vision in the setting of the patient's lack of sleep for the past 3 days in addition to mild hypertension.  Workup initiated to include EKG which revealed sinus rhythm, ventricular rate 98, nonspecific T wave changes, no STEMI.  A chest x-ray performed revealed no evidence of pulmonary edema, no other acute component of pulmonary disease.  CT head was performed revealed no acute cardiopulmonary process.  Lab evaluation significant for UDS only positive for THC, troponin 15, CMP generally unremarkable with a serum creatinine of 1.2 which is at the patient's baseline, no significant electrolyte abnormality other than mild hypercalcemia to 10.5.  The patient was found to have a urinalysis with large leukocytes, negative nitrites, 11-20 WBCs, rare bacteria.  She was administered fosfomycin for this.  CBC without a leukocytosis or anemia.  The patient's lack of sleep, mildly pressured speech, decreased food  intake, concern for hypomania or mania.  The patient is medically cleared at this time for TTS consultation.  TTS consult placed and home medications were ordered to include the patient's home blood pressure medications.  Patient remains in the ER voluntarily.   Final Clinical Impression(s) / ED Diagnoses Final diagnoses:  Blurred vision  Secondary hypertension  [facility])    Rx / DC Orders ED Discharge Orders     None         Ernie Avena, MD 04/11/23 1644

## 2023-04-11 NOTE — Discharge Instructions (Addendum)
A visual disturbance is any problem that interferes with your normal vision. This can affect one eye or both eyes. Some types of visual disturbances come and go without treatment and do not cause a permanent problem. Other visual disturbances may be a sign of an eye emergency or a medical emergency.  I recommended that you go to the ER today via EMS to get a head CT to make sure that the cause of your blurred vision is not a medical emergency (i.e. stroke) or an eye emergency.

## 2023-04-14 DIAGNOSIS — I10 Essential (primary) hypertension: Secondary | ICD-10-CM | POA: Diagnosis not present

## 2023-04-14 DIAGNOSIS — R413 Other amnesia: Secondary | ICD-10-CM | POA: Diagnosis not present

## 2023-04-28 DIAGNOSIS — I10 Essential (primary) hypertension: Secondary | ICD-10-CM | POA: Diagnosis not present

## 2023-04-28 DIAGNOSIS — N1831 Chronic kidney disease, stage 3a: Secondary | ICD-10-CM | POA: Diagnosis not present

## 2023-05-01 DIAGNOSIS — M13861 Other specified arthritis, right knee: Secondary | ICD-10-CM | POA: Diagnosis not present

## 2023-05-01 DIAGNOSIS — G894 Chronic pain syndrome: Secondary | ICD-10-CM | POA: Diagnosis not present

## 2023-05-01 DIAGNOSIS — M5416 Radiculopathy, lumbar region: Secondary | ICD-10-CM | POA: Diagnosis not present

## 2023-06-12 DIAGNOSIS — E785 Hyperlipidemia, unspecified: Secondary | ICD-10-CM | POA: Diagnosis not present

## 2023-06-12 DIAGNOSIS — N1831 Chronic kidney disease, stage 3a: Secondary | ICD-10-CM | POA: Diagnosis not present

## 2023-06-12 DIAGNOSIS — R7303 Prediabetes: Secondary | ICD-10-CM | POA: Diagnosis not present

## 2023-06-12 DIAGNOSIS — G4733 Obstructive sleep apnea (adult) (pediatric): Secondary | ICD-10-CM | POA: Diagnosis not present

## 2023-06-12 DIAGNOSIS — K224 Dyskinesia of esophagus: Secondary | ICD-10-CM | POA: Diagnosis not present

## 2023-06-12 DIAGNOSIS — Z0000 Encounter for general adult medical examination without abnormal findings: Secondary | ICD-10-CM | POA: Diagnosis not present

## 2023-06-12 DIAGNOSIS — I10 Essential (primary) hypertension: Secondary | ICD-10-CM | POA: Diagnosis not present

## 2023-07-18 DIAGNOSIS — Z23 Encounter for immunization: Secondary | ICD-10-CM

## 2023-07-18 NOTE — Progress Notes (Signed)
Pt received flu shot today

## 2023-10-16 DIAGNOSIS — R350 Frequency of micturition: Secondary | ICD-10-CM | POA: Diagnosis not present

## 2023-10-20 DIAGNOSIS — L309 Dermatitis, unspecified: Secondary | ICD-10-CM | POA: Diagnosis not present

## 2023-10-20 DIAGNOSIS — R110 Nausea: Secondary | ICD-10-CM | POA: Diagnosis not present

## 2023-12-28 ENCOUNTER — Ambulatory Visit: Payer: 59 | Admitting: Gastroenterology

## 2024-01-01 ENCOUNTER — Telehealth: Payer: Self-pay | Admitting: Physical Medicine and Rehabilitation

## 2024-01-01 NOTE — Telephone Encounter (Signed)
 Pt request another appointment for her right knee

## 2024-01-01 NOTE — Telephone Encounter (Signed)
 Patie

## 2024-01-15 ENCOUNTER — Ambulatory Visit: Admitting: Physical Medicine and Rehabilitation

## 2024-01-18 ENCOUNTER — Encounter: Payer: Self-pay | Admitting: Physical Medicine and Rehabilitation

## 2024-01-18 ENCOUNTER — Ambulatory Visit: Admitting: Physical Medicine and Rehabilitation

## 2024-01-18 ENCOUNTER — Other Ambulatory Visit: Payer: Self-pay | Admitting: Physical Medicine and Rehabilitation

## 2024-01-18 DIAGNOSIS — G894 Chronic pain syndrome: Secondary | ICD-10-CM | POA: Diagnosis not present

## 2024-01-18 DIAGNOSIS — M170 Bilateral primary osteoarthritis of knee: Secondary | ICD-10-CM | POA: Diagnosis not present

## 2024-01-18 DIAGNOSIS — G8929 Other chronic pain: Secondary | ICD-10-CM

## 2024-01-18 DIAGNOSIS — M25561 Pain in right knee: Secondary | ICD-10-CM

## 2024-01-18 NOTE — Progress Notes (Signed)
 Shannon Sharp - 58 y.o. female MRN [id number]  Date of birth: 09/04/1966  Office Visit Note: Visit Date: 01/18/2024 PCP: Georgann Housekeeper, MD Referred by: Georgann Housekeeper, MD  Subjective: Chief Complaint  Patient presents with   Right Knee - Pain   HPI: Shannon Sharp is a 58 y.o. female who comes in today as a self referral for evaluation of chronic, worsening and severe right sided knee pain. Pain ongoing for several years, worsens with activity and movement. Her pain is fairly constant, describes as dull and aching sensation, currently rates as 8 out of 10. Some relief of pain with home exercise regimen, rest and use of medications. Currently taking Celebrex once daily. History of formal physical therapy and reports no relief of pain with these treatments. Bilateral knee x-rays from 2019 exhibit tricompartmental degenerative changes. She underwent right genicular nerve blocks in our office on 12/22/2022. She reports greater than 80% relief of pain with this procedure, also reports increased functional ability post injection. She comes in today to discuss possible ablation procedure of right knee.   Of note, patient was scheduled to have left total knee arthroplasty with Dr. Ollen Gross in December of 2022 with plans to later complete same procedure on the right, however patient was not able to get scheduled time off work for recovery. Patient states she is not interested in surgical intervention at this time. Patient underwent double diagnostic blocks and subsequently genicular nerve ablation of left knee on 02/28/2023. She reports greater than 80% relief of pain with this procedure, pain relief continues to sustain. 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 Patient denies focal weakness, numbness and tingling. Patient denies recent trauma or falls.            Review of Systems  Musculoskeletal:  Positive for joint pain.  Neurological:  Negative for tingling, sensory change,  focal weakness and weakness.  All other systems reviewed and are negative.  Otherwise per HPI.  Assessment & Plan: Visit Diagnoses:    ICD-10-CM   1. Chronic pain of right knee  M25.561    G89.29     2. Bilateral primary osteoarthritis of knee  M17.0     3. Chronic pain syndrome  G89.4        Plan: Findings:  Chronic, worsening and severe right sided knee pain. Patient continues to have severe pain despite good conservative therapies such as home exercise regimen, rest and use of medications. Next step is to perform diagnostic right genicular nerve blocks under fluoroscopic guidance. She did get significant relief of pain with previous right genicular block, this would be her second block. If good relief with blocks we discussed longer sustained pain relief with genicular nerve radiofrequency ablation procedure. She has no questions at this time. I encouraged patient to remain active as tolerated. Should her left knee pain return we discussed repeating left genicular nerve radiofrequency ablation. No red flag symptoms noted upon exam today.     Meds & Orders: No orders of the defined types were placed in this encounter.  No orders of the defined types were placed in this encounter.   Follow-up: Return for Right genicular nerve blocks.   Procedures: No procedures performed      Clinical History: No specialty comments available.   She reports that she has never smoked. She has never used smokeless tobacco. No results for input(s): "HGBA1C", "LABURIC" in the last 8760 hours.  Objective:  VS:  HT:    WT:  BMI:     BP:   HR: bpm  TEMP: ( )  RESP:  Physical Exam Vitals and nursing note reviewed.  HENT:     Head: Normocephalic and atraumatic.     Right Ear: External ear normal.     Left Ear: External ear normal.     Nose: Nose normal.     Mouth/Throat:     Mouth: Mucous membranes are moist.  Eyes:     Extraocular Movements: Extraocular movements intact.  Cardiovascular:      Rate and Rhythm: Normal rate.     Pulses: Normal pulses.  Pulmonary:     Effort: Pulmonary effort is normal.  Abdominal:     General: Abdomen is flat. There is no distension.  Musculoskeletal:        General: Tenderness present.     Cervical back: Normal range of motion.     Comments: Full active and passive range of motion noted with both knees. No crepitus noted. No swelling noted. Tenderness noted to medial and lateral joint lines of right knee. Walks independently, gait steady.    Skin:    General: Skin is warm and dry.     Capillary Refill: Capillary refill takes less than 2 seconds.  Neurological:     General: No focal deficit present.     Mental Status: She is alert and oriented to person, place, and time.  Psychiatric:        Mood and Affect: Mood normal.        Behavior: Behavior normal.     Ortho Exam  Imaging: No results found.  Past Medical/Family/Surgical/Social History: Medications & Allergies reviewed per EMR, new medications updated. Patient Active Problem List   Diagnosis Date Noted   Attention deficit hyperactivity disorder 12/15/2021   Chronic pain syndrome 04/13/2021   Nausea with vomiting 10/28/2020   Incontinence of feces 02/12/2020   Osteoarthritis of knee 05/29/2018   CKD (chronic kidney disease), stage III (HCC) 04/11/2018   Bacteremia 04/10/2018   Acute lower UTI 04/10/2018   Heartburn    Dysphagia    Seasonal allergic rhinitis 05/08/2017   Gastroesophageal reflux disease with esophagitis 05/08/2017   Urinary frequency 03/02/2017   Acute pain of both knees 03/02/2017   Class 3 severe obesity due to excess calories without serious comorbidity with body mass index (BMI) of 45.0 to 49.9 in adult (HCC) 03/02/2017   OBESITY, NOS 12/28/2006   ANEMIA, IRON DEFICIENCY, UNSPEC. 12/28/2006   DEPRESSION, MAJOR, RECURRENT 12/28/2006   Bipolar 2 disorder (HCC) 12/28/2006   ANXIETY 12/28/2006   PANIC ATTACKS 12/28/2006   OBSESSIVE COMPUL. DISORDER  12/28/2006   ATTENTION DEFICIT, W/HYPERACTIVITY 12/28/2006   HEARING LOSS NOS OR DEAFNESS 12/28/2006   Atopic dermatitis 12/28/2006   INSOMNIA NOS 12/28/2006   Past Medical History:  Diagnosis Date   Allergy    Anxiety    Bipolar 2 disorder (HCC)    Cholelithiasis 02/2017   DDD (degenerative disc disease), cervical    Depression    Diarrhea    Fecal incontinence    GERD (gastroesophageal reflux disease)    HTN (hypertension)    Internal hemorrhoids 10/2017   noted on colonoscopy   Leg swelling    Morbid obesity (HCC)    OA (osteoarthritis)    OSA on CPAP    Peristalsis 12/2017   weak and frequent failed noted on esophageal manometry   Seizure (HCC) 02/2018   Urinary incontinence    UTI (urinary tract infection) 04/10/2018   Hospitalized  Family History  Problem Relation Age of Onset   Hypertension Mother    Hypertension Father    Diabetes Paternal Aunt    Diabetes Paternal Uncle    Colon cancer Neg Hx    Stomach cancer Neg Hx    Pancreatic cancer Neg Hx    Past Surgical History:  Procedure Laterality Date   73 HOUR PH STUDY N/A 01/08/2018   Procedure: 24 HOUR PH STUDY;  Surgeon: Napoleon Form, MD;  Location: [facility];  Service: Endoscopy;  Laterality: N/A;   COLONOSCOPY  11/08/2017   ESOPHAGEAL MANOMETRY N/A 01/08/2018   Procedure: ESOPHAGEAL MANOMETRY (EM);  Surgeon: Napoleon Form, MD;  Location: [facility];  Service: Endoscopy;  Laterality: N/A;   ESOPHAGOGASTRODUODENOSCOPY ENDOSCOPY  11/08/2017   Social History   Occupational History   Occupation: Disability  Tobacco Use   Smoking status: Never   Smokeless tobacco: Never  Vaping Use   Vaping status: Never Used  Substance and Sexual Activity   Alcohol use: Not Currently   Drug use: No   Sexual activity: Not Currently    Birth control/protection: I.U.D.

## 2024-01-18 NOTE — Progress Notes (Signed)
 Pain Scale   Average Pain 8 Consult for Knee Ablation         +Driver, -BT, -Dye Allergies.

## 2024-01-27 DIAGNOSIS — M5416 Radiculopathy, lumbar region: Secondary | ICD-10-CM | POA: Diagnosis not present

## 2024-01-29 ENCOUNTER — Telehealth: Payer: Self-pay | Admitting: Physical Medicine and Rehabilitation

## 2024-01-29 NOTE — Telephone Encounter (Signed)
Pt is calling to reschedule appointment.

## 2024-02-01 ENCOUNTER — Ambulatory Visit: Payer: 59 | Admitting: Gastroenterology

## 2024-02-01 ENCOUNTER — Encounter: Admitting: Physical Medicine and Rehabilitation

## 2024-02-05 ENCOUNTER — Encounter: Admitting: Physical Medicine and Rehabilitation

## 2024-02-07 ENCOUNTER — Telehealth: Payer: Self-pay | Admitting: Physical Medicine and Rehabilitation

## 2024-02-07 NOTE — Telephone Encounter (Signed)
 Patient called. Would like to cancel and not [facility] with [facility].

## 2024-02-08 ENCOUNTER — Encounter: Admitting: Physical Medicine and Rehabilitation

## 2024-02-29 ENCOUNTER — Telehealth: Payer: Self-pay | Admitting: Physical Medicine and Rehabilitation

## 2024-02-29 NOTE — Telephone Encounter (Signed)
 Pt called for a call back for right knee injection. Please call pt about this matter at [phone].

## 2024-04-09 ENCOUNTER — Ambulatory Visit: Admitting: Gastroenterology

## 2024-04-22 ENCOUNTER — Telehealth: Payer: Self-pay | Admitting: Physical Medicine and Rehabilitation

## 2024-04-22 NOTE — Telephone Encounter (Signed)
 Pt called requesting an appt with [facility]. Last 3 appts cancelled. Please call pt at [phone].

## 2024-05-16 ENCOUNTER — Ambulatory Visit (INDEPENDENT_AMBULATORY_CARE_PROVIDER_SITE_OTHER): Admitting: Physical Medicine and Rehabilitation

## 2024-05-16 ENCOUNTER — Other Ambulatory Visit: Payer: Self-pay

## 2024-05-16 DIAGNOSIS — G8929 Other chronic pain: Secondary | ICD-10-CM

## 2024-05-16 DIAGNOSIS — M25561 Pain in right knee: Secondary | ICD-10-CM

## 2024-05-16 DIAGNOSIS — M170 Bilateral primary osteoarthritis of knee: Secondary | ICD-10-CM

## 2024-05-16 DIAGNOSIS — G894 Chronic pain syndrome: Secondary | ICD-10-CM

## 2024-05-16 NOTE — Progress Notes (Signed)
 Pain Scale   Average Pain 7 Patient advising she has chronic right knee pain.        +Driver, -BT, -Dye Allergies.

## 2024-05-19 NOTE — Progress Notes (Signed)
 Shannon Sharp - 58 y.o. female MRN [id number]  Date of birth: 12-May-1966  Office Visit Note: Visit Date: 05/16/2024 PCP: Ransom Other, MD Referred by: Ransom Other, MD  Subjective: Chief Complaint  Patient presents with   Right Knee - Pain   HPI:  Shannon Sharp is a 58 y.o. female who comes in today at the request of Charlie Dolores, MD and Duwaine Pouch, FNP for planned Right  Genicular nerve block block with fluoroscopic guidance.  The patient has failed conservative care including injections, home exercise, medications, time and activity modification.  Nerve block will be performed in a double block paradigm with goal towards radiofrequency ablation of the same genicular nerves for longer term definitive care.  Please see requesting physician notes for further details and justification.  This is the second block.   ROS Otherwise per HPI.  Assessment & Plan: Visit Diagnoses:    ICD-10-CM   1. Chronic pain of right knee  M25.561 XR C-ARM NO REPORT   G89.29 Nerve Block    2. Bilateral primary osteoarthritis of knee  M17.0 XR C-ARM NO REPORT    Nerve Block    3. Chronic pain syndrome  G89.4 XR C-ARM NO REPORT    Nerve Block      Plan: No additional findings.   Meds & Orders: No orders of the defined types were placed in this encounter.   Orders Placed This Encounter  Procedures   Nerve Block   XR C-ARM NO REPORT    Follow-up: Return for Review Pain Diary.   Procedures: No procedures performed  Genicular Nerve Blockade with Fluoroscopic Guidance  Patient: Shannon Sharp      Date of Birth: 1966/08/04 MRN: [id number] PCP: Ransom Other, MD      Visit Date: 05/16/2024   Universal Protocol:    Date/Time: 07/20/258:46 PM  Consent Given By: the patient  Position: SUPINE  Additional Comments: Vital signs were monitored before and after the procedure. Patient was prepped and draped in the usual sterile fashion. The correct patient, procedure,  and site was verified.   Injection Procedure Details:  Procedure Site One Meds Administered: No orders of the defined types were placed in this encounter.    Laterality: Right  Location/Site:  Superior medial, superior lateral and inferior medial genicular nerve  Needle size: 3.5 inch 25-gauge spinal needle  Needle Placement: Just posterior to midline on the lateral view at the locations noted in the procedure note.   Findings:    -Comments: Excellent position of the needle tip using biplanar imaging with low contrast showing no vascular flow.  Procedure Details: The fluoroscope was positioned in a 90 degree anteroposterior (AP) fluoroscopic view and then tilted to obtain a true AP view of the knee joint.  The target areas are the mid-point of the curve formed where the femoral shaft and epicondyles meet as well as the midpoint of the curve on the medial side where the tibial shaft meets the plateau.  Using fluoroscopic guidance the target areas were infiltrated using a 27-gauge needle and 1-2 mL of 1% lidocaine  without epinephrine to provide local anesthetic.  Then, for each target area a 3-1/2 inch 25-gauge spinal needle was driven down under intermittent fluoroscopic guidance to the periosteum of the target site.  Once all 3 needles were in place in the AP view, a lateral view was obtained at each needle was driven control to the midpoint of the shafts respectively.  A spot film was obtained of  the lateral view and saved.  Next, an AP view was obtained once again to confirm placement.  A spot film then was obtained and saved.  After fluoroscopic imaging was used to determine and confirm placement, 0.5-1 mL of injectate was delivered at each target location.  The patient was given a pain diary to complete and return to the office.      Additional Comments:  The patient tolerated the procedure well Dressing: Band-Aid    Post-procedure details: Patient was observed during the  procedure. Post-procedure instructions were reviewed.  Patient left the clinic in stable condition.          Clinical History: No specialty comments available.     Objective:  VS:  HT:    WT:   BMI:     BP:   HR: bpm  TEMP: ( )  RESP:  Physical Exam Vitals and nursing note reviewed.  Constitutional:      General: She is not in acute distress.    Appearance: Normal appearance. She is not ill-appearing.  HENT:     Head: Normocephalic and atraumatic.     Right Ear: External ear normal.     Left Ear: External ear normal.  Eyes:     Extraocular Movements: Extraocular movements intact.  Cardiovascular:     Rate and Rhythm: Normal rate.     Pulses: Normal pulses.  Pulmonary:     Effort: Pulmonary effort is normal. No respiratory distress.  Abdominal:     General: There is no distension.     Palpations: Abdomen is soft.  Musculoskeletal:        General: Tenderness present.     Cervical back: Neck supple.     Right lower leg: No edema.     Left lower leg: No edema.     Comments: Patient has good distal strength with no pain over the greater trochanters.  No clonus or focal weakness.  Examination of right knee shows midline joint tenderness to palpation along with mild effusion with good range of motion.  Patient has antalgic gait.  Skin:    Findings: No erythema, lesion or rash.  Neurological:     General: No focal deficit present.     Mental Status: She is alert and oriented to person, place, and time.     Cranial Nerves: No cranial nerve deficit.     Sensory: No sensory deficit.     Motor: No weakness or abnormal muscle tone.     Coordination: Coordination normal.     Gait: Gait abnormal.  Psychiatric:        Mood and Affect: Mood normal.        Behavior: Behavior normal.      Imaging: No results found.

## 2024-05-19 NOTE — Procedures (Signed)
 Genicular Nerve Blockade with Fluoroscopic Guidance  Patient: Shannon Sharp      Date of Birth: [birth year] MRN: [id number] PCP: Ransom Other, MD      Visit Date: 05/16/2024   Universal Protocol:    Date/Time: 07/20/258:46 PM  Consent Given By: the patient  Position: SUPINE  Additional Comments: Vital signs were monitored before and after the procedure. Patient was prepped and draped in the usual sterile fashion. The correct patient, procedure, and site was verified.   Injection Procedure Details:  Procedure Site One Meds Administered: No orders of the defined types were placed in this encounter.    Laterality: Right  Location/Site:  Superior medial, superior lateral and inferior medial genicular nerve  Needle size: 3.5 inch 25-gauge spinal needle  Needle Placement: Just posterior to midline on the lateral view at the locations noted in the procedure note.   Findings:    -Comments: Excellent position of the needle tip using biplanar imaging with low contrast showing no vascular flow.  Procedure Details: The fluoroscope was positioned in a 90 degree anteroposterior (AP) fluoroscopic view and then tilted to obtain a true AP view of the knee joint.  The target areas are the mid-point of the curve formed where the femoral shaft and epicondyles meet as well as the midpoint of the curve on the medial side where the tibial shaft meets the plateau.  Using fluoroscopic guidance the target areas were infiltrated using a 27-gauge needle and 1-2 mL of 1% lidocaine  without epinephrine to provide local anesthetic.  Then, for each target area a 3-1/2 inch 25-gauge spinal needle was driven down under intermittent fluoroscopic guidance to the periosteum of the target site.  Once all 3 needles were in place in the AP view, a lateral view was obtained at each needle was driven control to the midpoint of the shafts respectively.  A spot film was obtained of the lateral view and saved.   Next, an AP view was obtained once again to confirm placement.  A spot film then was obtained and saved.  After fluoroscopic imaging was used to determine and confirm placement, 0.5-1 mL of injectate was delivered at each target location.  The patient was given a pain diary to complete and return to the office.      Additional Comments:  The patient tolerated the procedure well Dressing: Band-Aid    Post-procedure details: Patient was observed during the procedure. Post-procedure instructions were reviewed.  Patient left the clinic in stable condition.

## 2024-05-21 ENCOUNTER — Telehealth: Payer: Self-pay

## 2024-05-21 NOTE — Telephone Encounter (Signed)
 Sent message about patients pain to Dr. Eldonna and Duwaine

## 2024-05-21 NOTE — Telephone Encounter (Signed)
 Pt called triage today complaining of 8/10 stabbing pain at injection site. Pt is wanting to know if there is anything to do for the pain. Please advise.

## 2024-05-23 ENCOUNTER — Encounter: Payer: Self-pay | Admitting: Physical Medicine and Rehabilitation

## 2024-05-23 ENCOUNTER — Ambulatory Visit: Admitting: Physical Medicine and Rehabilitation

## 2024-05-23 DIAGNOSIS — G894 Chronic pain syndrome: Secondary | ICD-10-CM | POA: Diagnosis not present

## 2024-05-23 DIAGNOSIS — M25561 Pain in right knee: Secondary | ICD-10-CM | POA: Diagnosis not present

## 2024-05-23 DIAGNOSIS — M170 Bilateral primary osteoarthritis of knee: Secondary | ICD-10-CM

## 2024-05-23 DIAGNOSIS — G8929 Other chronic pain: Secondary | ICD-10-CM

## 2024-05-23 NOTE — Progress Notes (Signed)
 Shannon Sharp - 58 y.o. female MRN [id number]  Date of birth: 1966/01/05  Office Visit Note: Visit Date: 05/23/2024 PCP: Ransom Other, MD Referred by: Ransom Other, MD  Subjective: Chief Complaint  Patient presents with   Left Knee - Pain   HPI: Shannon Sharp is a 58 y.o. female who comes in today for evaluation of chronic, worsening and severe right sided knee pain. She was initially referred to us  by Dr. Charlie Sharp. She was previously managed by Dr. Elspeth Sharp from orthopedic standpoint. History of right genicular nerve blocks in February of 2024 without any issues. She is here today post right genicular nerve blocks performed in our office on 05/16/2024. She reports increased pain post injection, reports pain to injection sites the day of procedure. Describes as sharp and stabbing sensation, currently rates as 7 out of 10. Sharp pain has decreased over the last several days. No redness, no swelling noted. She is here today to discuss treatment options. Bilateral knee x-rays from 2019 exhibit tricompartmental degenerative changes. We were able to complete diagnostic blocks to left knee and radiofrequency ablation without difficulty, she denies left knee complaints at this time. No recent trauma or falls.      Review of Systems  Musculoskeletal:  Positive for joint pain and myalgias.  Neurological:  Negative for tingling, sensory change, focal weakness and weakness.  All other systems reviewed and are negative.  Otherwise per HPI.  Assessment & Plan: Visit Diagnoses:    ICD-10-CM   1. Chronic pain of right knee  M25.561 Ambulatory referral to Orthopedic Surgery   G89.29     2. Bilateral primary osteoarthritis of knee  M17.0 Ambulatory referral to Orthopedic Surgery    3. Chronic pain syndrome  G89.4 Ambulatory referral to Orthopedic Surgery       Plan: Findings:  Chronic, worsening and severe pain to right knee. Sharp pain increased post right genicular nerve  blocks performed on 05/16/2024. Over the last week Sharp pain has improved some, however she continues to experience discomfort. No redness, swelling, signs of infection noted at injection sites. There is significant tricompartmental arthritis to right knee, most severe to medial aspect of knee. We discussed treatment plan in detail today. I think it would be best for Sharp to follow up with Dr. Her with [facility] to discuss treatment plan. We are happy to see Sharp back as needed. No red flag symptoms noted upon exam today.     Meds & Orders: No orders of the defined types were placed in this encounter.   Orders Placed This Encounter  Procedures   Ambulatory referral to Orthopedic Surgery    Follow-up: Return for Referral to Dr. Her wtih [facility].   Procedures: No procedures performed      Clinical History: No specialty comments available.   She reports that she has never smoked. She has never used smokeless tobacco. No results for input(s): HGBA1C, LABURIC in the last 8760 hours.  Objective:  VS:  HT:    WT:   BMI:     BP:   HR: bpm  TEMP: ( )  RESP:  Physical Exam Vitals and nursing note reviewed.  HENT:     Head: Normocephalic and atraumatic.     Right Ear: External ear normal.     Left Ear: External ear normal.     Nose: Nose normal.     Mouth/Throat:     Mouth: Mucous membranes are moist.  Eyes:     Extraocular  Movements: Extraocular movements intact.  Cardiovascular:     Rate and Rhythm: Normal rate.     Pulses: Normal pulses.  Pulmonary:     Effort: Pulmonary effort is normal.  Abdominal:     General: Abdomen is flat. There is no distension.  Musculoskeletal:        General: Tenderness present.     Cervical back: Normal range of motion.     Comments: Full active and passive range of motion noted with both knees. No crepitus noted. No swelling noted. Tenderness noted to medial and lateral joint lines of right knee. Walks independently, gait steady.      Skin:    General: Skin is warm and dry.     Capillary Refill: Capillary refill takes less than 2 seconds.  Neurological:     General: No focal deficit present.     Mental Status: She is alert and oriented to person, place, and time.  Psychiatric:        Mood and Affect: Mood normal.        Behavior: Behavior normal.     Ortho Exam  Imaging: No results found.  Past Medical/Family/Surgical/Social History: Medications & Allergies reviewed per EMR, new medications updated. Patient Active Problem List   Diagnosis Date Noted   Attention deficit hyperactivity disorder 12/15/2021   Chronic pain syndrome 04/13/2021   Nausea with vomiting 10/28/2020   Incontinence of feces 02/12/2020   Osteoarthritis of knee 05/29/2018   CKD (chronic kidney disease), stage III (HCC) 04/11/2018   Bacteremia 04/10/2018   Acute lower UTI 04/10/2018   Heartburn    Dysphagia    Seasonal allergic rhinitis 05/08/2017   Gastroesophageal reflux disease with esophagitis 05/08/2017   Urinary frequency 03/02/2017   Acute pain of both knees 03/02/2017   Class 3 severe obesity due to excess calories without serious comorbidity with body mass index (BMI) of 45.0 to 49.9 in adult 03/02/2017   OBESITY, NOS 12/28/2006   ANEMIA, IRON DEFICIENCY, UNSPEC. 12/28/2006   DEPRESSION, MAJOR, RECURRENT 12/28/2006   Bipolar 2 disorder (HCC) 12/28/2006   ANXIETY 12/28/2006   PANIC ATTACKS 12/28/2006   OBSESSIVE COMPUL. DISORDER 12/28/2006   ATTENTION DEFICIT, W/HYPERACTIVITY 12/28/2006   HEARING LOSS NOS OR DEAFNESS 12/28/2006   Atopic dermatitis 12/28/2006   INSOMNIA NOS 12/28/2006   Past Medical History:  Diagnosis Date   Allergy    Anxiety    Bipolar 2 disorder (HCC)    Cholelithiasis 02/2017   DDD (degenerative disc disease), cervical    Depression    Diarrhea    Fecal incontinence    GERD (gastroesophageal reflux disease)    HTN (hypertension)    Internal hemorrhoids 10/2017   noted on colonoscopy   Leg  swelling    Morbid obesity (HCC)    OA (osteoarthritis)    OSA on CPAP    Peristalsis 12/2017   weak and frequent failed noted on esophageal manometry   Seizure (HCC) 02/2018   Urinary incontinence    UTI (urinary tract infection) 04/10/2018   Hospitalized   Family History  Problem Relation Age of Onset   Hypertension Mother    Hypertension Father    Diabetes Paternal Aunt    Diabetes Paternal Uncle    Colon cancer Neg Hx    Stomach cancer Neg Hx    Pancreatic cancer Neg Hx    Past Surgical History:  Procedure Laterality Date   32 HOUR PH STUDY N/A 01/08/2018   Procedure: 24 HOUR PH STUDY;  Surgeon: Shila,  Gustav GAILS, MD;  Location: [facility];  Service: Endoscopy;  Laterality: N/A;   COLONOSCOPY  11/08/2017   ESOPHAGEAL MANOMETRY N/A 01/08/2018   Procedure: ESOPHAGEAL MANOMETRY (EM);  Surgeon: Shila Gustav GAILS, MD;  Location: [facility];  Service: Endoscopy;  Laterality: N/A;   ESOPHAGOGASTRODUODENOSCOPY ENDOSCOPY  11/08/2017   Social History   Occupational History   Occupation: Disability  Tobacco Use   Smoking status: Never   Smokeless tobacco: Never  Vaping Use   Vaping status: Never Used  Substance and Sexual Activity   Alcohol use: Not Currently   Drug use: No   Sexual activity: Not Currently    Birth control/protection: I.U.D.

## 2024-05-23 NOTE — Progress Notes (Signed)
 Pain Scale   Average Pain 7 Patient advised she has right knee pain and injection didn't help.        +Driver, -BT, -Dye Allergies.

## 2024-06-20 DIAGNOSIS — L309 Dermatitis, unspecified: Secondary | ICD-10-CM | POA: Diagnosis not present

## 2024-06-20 DIAGNOSIS — N1831 Chronic kidney disease, stage 3a: Secondary | ICD-10-CM | POA: Diagnosis not present

## 2024-06-20 DIAGNOSIS — R7303 Prediabetes: Secondary | ICD-10-CM | POA: Diagnosis not present

## 2024-06-20 DIAGNOSIS — Z0000 Encounter for general adult medical examination without abnormal findings: Secondary | ICD-10-CM | POA: Diagnosis not present

## 2024-06-20 DIAGNOSIS — M179 Osteoarthritis of knee, unspecified: Secondary | ICD-10-CM | POA: Diagnosis not present

## 2024-06-20 DIAGNOSIS — I10 Essential (primary) hypertension: Secondary | ICD-10-CM | POA: Diagnosis not present

## 2024-06-20 DIAGNOSIS — G4733 Obstructive sleep apnea (adult) (pediatric): Secondary | ICD-10-CM | POA: Diagnosis not present

## 2024-06-21 ENCOUNTER — Other Ambulatory Visit: Payer: Self-pay | Admitting: Internal Medicine

## 2024-06-21 DIAGNOSIS — Z1231 Encounter for screening mammogram for malignant neoplasm of breast: Secondary | ICD-10-CM

## 2024-07-03 ENCOUNTER — Ambulatory Visit
Admission: EM | Admit: 2024-07-03 | Discharge: 2024-07-03 | Disposition: A | Discharge status: Home / Self Care | Attending: Family Medicine | Admitting: Family Medicine

## 2024-07-03 DIAGNOSIS — R112 Nausea with vomiting, unspecified: Secondary | ICD-10-CM | POA: Diagnosis not present

## 2024-07-03 DIAGNOSIS — R197 Diarrhea, unspecified: Secondary | ICD-10-CM | POA: Diagnosis not present

## 2024-07-03 DIAGNOSIS — A084 Viral intestinal infection, unspecified: Secondary | ICD-10-CM

## 2024-07-03 MED ORDER — ONDANSETRON 8 MG PO TBDP: 8.0000 mg | ORAL_TABLET | Freq: Three times a day (TID) | ORAL | 0 refills | Status: AC | PRN

## 2024-07-03 MED ORDER — LOPERAMIDE HCL 2 MG PO CAPS: 2.0000 mg | ORAL_CAPSULE | Freq: Two times a day (BID) | ORAL | 0 refills | Status: AC | PRN

## 2024-07-03 NOTE — ED Provider Notes (Signed)
 [name]  Note:  This document was prepared using Conservation officer, historic buildings and may include unintentional dictation errors.  MRN: [id number] DOB: 1966-07-24  Subjective:   Shannon Sharp is a 58 y.o. female presenting for 1 day history of acute onset belly cramping, nausea, vomiting, diarrhea.  Has had more diarrhea and upset stomach than anything.  Has used Pepto-Bismol and over-the-counter antidiarrheal meds without relief.  Has had multiple sick contacts at work with GI symptoms.  No fever, bloody stools, recent antibiotic use, hospitalizations or long distance travel.  Has not eaten raw foods, drank unfiltered water.  No history of GI disorders including Crohn's, IBS, ulcerative colitis.   No current facility-administered medications for this encounter.  Current Outpatient Medications:    bismuth subsalicylate (PEPTO BISMOL) 262 MG/15ML suspension, Take 30 mLs by mouth every 6 (six) hours as needed., Disp: , Rfl:    busPIRone (BUSPAR) 10 MG tablet, Take 10 mg by mouth 2 (two) times daily., Disp: , Rfl:    celecoxib (CELEBREX) 200 MG capsule, Take 200 mg by mouth daily as needed., Disp: , Rfl:    cephALEXin  (KEFLEX ) 500 MG capsule, Take 1 capsule (500 mg total) by mouth 4 (four) times daily., Disp: 20 capsule, Rfl: 0   dicyclomine  (BENTYL ) 10 MG capsule, Take 1 capsule (10 mg total) by mouth 4 (four) times daily -  before meals and at bedtime., Disp: 90 capsule, Rfl: 2   DULoxetine (CYMBALTA) 60 MG capsule, Take 60 mg by mouth at bedtime., Disp: , Rfl:    escitalopram (LEXAPRO) 20 MG tablet, 1 tablet (Patient not taking: Reported on 05/30/2022), Disp: , Rfl:    famotidine  (PEPCID ) 40 MG tablet, Take 1 tablet (40 mg total) by mouth daily as needed for heartburn or indigestion. (Patient not taking: Reported on 05/30/2022), Disp: 30 tablet, Rfl: 11   irbesartan  (AVAPRO ) 300 MG tablet, 1 tablet, Disp: , Rfl:    irbesartan -hydrochlorothiazide  (AVALIDE)  300-12.5 MG tablet, 1 tablet daily., Disp: , Rfl:    lamoTRIgine (LAMICTAL) 25 MG tablet, 1 tablet qam and 2 qhs, Disp: , Rfl:    loperamide  (IMODIUM ) 2 MG capsule, Take 1 capsule (2 mg total) by mouth as needed for diarrhea or loose stools., Disp: 30 capsule, Rfl: 0   ondansetron  (ZOFRAN  ODT) 4 MG disintegrating tablet, Take 1 tablet (4 mg total) by mouth daily in the afternoon. As needed, Disp: 30 tablet, Rfl: 1   ondansetron  (ZOFRAN ) 8 MG tablet, Take 1 tablet (8 mg total) by mouth every 8 (eight) hours as needed for nausea or vomiting., Disp: 30 tablet, Rfl: 5   ondansetron  (ZOFRAN -ODT) 4 MG disintegrating tablet, Take 1 tablet (4 mg total) by mouth every 8 (eight) hours as needed for nausea or vomiting., Disp: 30 tablet, Rfl: 3   oxymorphone (OPANA) 10 MG tablet, Take 1 tablet 3 times a day by oral route as needed for 10 days., Disp: , Rfl:    REXULTI 3 MG TABS, Take 1 tablet by mouth at bedtime., Disp: , Rfl:    rifaximin  (XIFAXAN ) 550 MG TABS tablet, Take 1 tablet (550 mg total) by mouth 3 (three) times daily., Disp: 42 tablet, Rfl: 0   tolterodine (DETROL LA) 4 MG 24 hr capsule, Take 4 mg by mouth daily., Disp: , Rfl:    topiramate  (TOPAMAX ) 100 MG tablet, Take 300 mg by mouth daily after supper. , Disp: , Rfl:    topiramate  (TOPAMAX ) 100 MG tablet, 3 tablet, Disp: ,  Rfl:    TRAZODONE HCL PO, Take 75 mg by mouth daily., Disp: , Rfl:    Allergies  Allergen Reactions   Penicillins Itching and Other (See Comments)    Has patient had a PCN reaction causing immediate rash, facial/tongue/throat swelling, SOB or lightheadedness with hypotension: no Has patient had a PCN reaction causing severe rash involving mucus membranes or skin necrosis: no Has patient had a PCN reaction that required hospitalization: no Has patient had a PCN reaction occurring within the last 10 years: no If all of the above answers are NO, then may proceed with Cephalosporin use.    Penicillins Itching   Sulfa  Antibiotics Swelling    Other reaction(s): tongue swelling   Sulfa Antibiotics     Other reaction(s): tongue swelling    Past Medical History:  Diagnosis Date   Allergy    Anxiety    Bipolar 2 disorder (HCC)    Cholelithiasis 02/2017   DDD (degenerative disc disease), cervical    Depression    Diarrhea    Fecal incontinence    GERD (gastroesophageal reflux disease)    HTN (hypertension)    Internal hemorrhoids 10/2017   noted on colonoscopy   Leg swelling    Morbid obesity (HCC)    OA (osteoarthritis)    OSA on CPAP    Peristalsis 12/2017   weak and frequent failed noted on esophageal manometry   Seizure (HCC) 02/2018   Urinary incontinence    UTI (urinary tract infection) 04/10/2018   Hospitalized     Past Surgical History:  Procedure Laterality Date   89 HOUR PH STUDY N/A 01/08/2018   Procedure: 24 HOUR PH STUDY;  Surgeon: Shila Gustav GAILS, MD;  Location: [facility];  Service: Endoscopy;  Laterality: N/A;   COLONOSCOPY  11/08/2017   ESOPHAGEAL MANOMETRY N/A 01/08/2018   Procedure: ESOPHAGEAL MANOMETRY (EM);  Surgeon: Shila Gustav GAILS, MD;  Location: [facility];  Service: Endoscopy;  Laterality: N/A;   ESOPHAGOGASTRODUODENOSCOPY ENDOSCOPY  11/08/2017    Family History  Problem Relation Age of Onset   Hypertension Mother    Hypertension Father    Diabetes Paternal Aunt    Diabetes Paternal Uncle    Colon cancer Neg Hx    Stomach cancer Neg Hx    Pancreatic cancer Neg Hx     Social History   Tobacco Use   Smoking status: Never   Smokeless tobacco: Never  Vaping Use   Vaping status: Never Used  Substance Use Topics   Alcohol use: Not Currently   Drug use: No    ROS   Objective:   Vitals: BP 113/62 (BP Location: Right Arm)   Pulse (!) 55   Temp 98.9 F (37.2 C) (Oral)   Resp 16   SpO2 96%   Physical Exam Constitutional:      General: She is not in acute distress.    Appearance: Normal appearance. She is well-developed. She is not  ill-appearing, toxic-appearing or diaphoretic.  HENT:     Head: Normocephalic and atraumatic.     Right Ear: External ear normal.     Left Ear: External ear normal.     Nose: Nose normal.     Mouth/Throat:     Mouth: Mucous membranes are moist.  Eyes:     General: No scleral icterus.       Right eye: No discharge.        Left eye: No discharge.     Extraocular Movements: Extraocular movements intact.  Conjunctiva/sclera: Conjunctivae normal.  Cardiovascular:     Rate and Rhythm: Normal rate.  Pulmonary:     Effort: Pulmonary effort is normal.  Abdominal:     General: Bowel sounds are increased. There is no distension.     Palpations: Abdomen is soft. There is no mass.     Tenderness: There is no abdominal tenderness. There is no right CVA tenderness, left CVA tenderness, guarding or rebound.  Skin:    General: Skin is warm and dry.  Neurological:     General: No focal deficit present.     Mental Status: She is alert and oriented to person, place, and time.  Psychiatric:        Mood and Affect: Mood normal.        Behavior: Behavior normal.        Thought Content: Thought content normal.        Judgment: Judgment normal.     Assessment and Plan :   PDMP not reviewed this encounter.  1. Viral gastroenteritis   2. Nausea vomiting and diarrhea    Will manage for viral gastroenteritis with supportive care.  Rx for Zofran  for nausea and vomiting, Imodium  for diarrhea.  Patient is to push fluids and eat light meals including soups and soft foods. Counseled patient on potential for adverse effects with medications prescribed/recommended today, ER and return-to-clinic precautions discussed, patient verbalized understanding.    Christopher Savannah, [place] 07/03/24 1649

## 2024-07-03 NOTE — ED Triage Notes (Signed)
 Pt reports nausea and diarrhea since last night. Pepto Bismol and OTC antidiarrhea meds gives no relief.

## 2024-07-03 NOTE — Discharge Instructions (Addendum)

## 2024-07-10 DIAGNOSIS — R197 Diarrhea, unspecified: Secondary | ICD-10-CM | POA: Diagnosis not present

## 2024-07-10 DIAGNOSIS — R112 Nausea with vomiting, unspecified: Secondary | ICD-10-CM | POA: Diagnosis not present

## 2024-08-12 DIAGNOSIS — M25562 Pain in left knee: Secondary | ICD-10-CM | POA: Diagnosis not present

## 2024-08-12 DIAGNOSIS — M25561 Pain in right knee: Secondary | ICD-10-CM | POA: Diagnosis not present

## 2024-08-21 ENCOUNTER — Ambulatory Visit: Admitting: Gastroenterology

## 2024-08-22 ENCOUNTER — Ambulatory Visit: Admitting: Gastroenterology

## 2024-09-02 ENCOUNTER — Encounter: Payer: Self-pay | Admitting: Radiology

## 2024-09-19 ENCOUNTER — Ambulatory Visit
Admission: RE | Admit: 2024-09-19 | Discharge: 2024-09-19 | Disposition: A | Source: Ambulatory Visit | Attending: Internal Medicine | Admitting: Internal Medicine

## 2024-09-19 DIAGNOSIS — Z1231 Encounter for screening mammogram for malignant neoplasm of breast: Secondary | ICD-10-CM
